# Patient Record
Sex: Female | Born: 1950 | Race: Black or African American | Hispanic: No | Marital: Single | Smoking: Former smoker
Health system: Southern US, Community
[De-identification: ages and names within clinical notes are randomized; demographics above are authoritative.]

## PROBLEM LIST (undated history)

## (undated) DIAGNOSIS — R112 Nausea with vomiting, unspecified: Secondary | ICD-10-CM

## (undated) DIAGNOSIS — F329 Major depressive disorder, single episode, unspecified: Secondary | ICD-10-CM

## (undated) DIAGNOSIS — F419 Anxiety disorder, unspecified: Secondary | ICD-10-CM

## (undated) DIAGNOSIS — M1990 Unspecified osteoarthritis, unspecified site: Secondary | ICD-10-CM

## (undated) DIAGNOSIS — F32A Depression, unspecified: Secondary | ICD-10-CM

## (undated) DIAGNOSIS — I10 Essential (primary) hypertension: Secondary | ICD-10-CM

## (undated) DIAGNOSIS — Z9889 Other specified postprocedural states: Secondary | ICD-10-CM

## (undated) DIAGNOSIS — C801 Malignant (primary) neoplasm, unspecified: Secondary | ICD-10-CM

## (undated) DIAGNOSIS — I509 Heart failure, unspecified: Secondary | ICD-10-CM

## (undated) HISTORY — PX: OTHER SURGICAL HISTORY: SHX169

## (undated) HISTORY — PX: HERNIA REPAIR: SHX51

## (undated) HISTORY — DX: Depression, unspecified: F32.A

## (undated) HISTORY — DX: Major depressive disorder, single episode, unspecified: F32.9

## (undated) HISTORY — DX: Heart failure, unspecified: I50.9

## (undated) HISTORY — DX: Anxiety disorder, unspecified: F41.9

## (undated) HISTORY — DX: Unspecified osteoarthritis, unspecified site: M19.90

## (undated) HISTORY — DX: Malignant (primary) neoplasm, unspecified: C80.1

## (undated) HISTORY — PX: BREAST BIOPSY: SHX20

## (undated) HISTORY — DX: Essential (primary) hypertension: I10

---

## 1975-10-15 DIAGNOSIS — J939 Pneumothorax, unspecified: Secondary | ICD-10-CM

## 1975-10-15 HISTORY — DX: Pneumothorax, unspecified: J93.9

## 1976-10-14 DIAGNOSIS — C801 Malignant (primary) neoplasm, unspecified: Secondary | ICD-10-CM

## 1976-10-14 HISTORY — DX: Malignant (primary) neoplasm, unspecified: C80.1

## 2005-10-14 HISTORY — PX: HIP SURGERY: SHX245

## 2005-10-14 HISTORY — PX: JOINT REPLACEMENT: SHX530

## 2015-05-28 DIAGNOSIS — S0181XA Laceration without foreign body of other part of head, initial encounter: Secondary | ICD-10-CM | POA: Diagnosis not present

## 2016-10-31 ENCOUNTER — Ambulatory Visit: Payer: Self-pay | Admitting: Family Medicine

## 2016-11-19 ENCOUNTER — Ambulatory Visit (INDEPENDENT_AMBULATORY_CARE_PROVIDER_SITE_OTHER): Payer: Medicare Other | Admitting: Family Medicine

## 2016-11-19 ENCOUNTER — Other Ambulatory Visit: Payer: Self-pay

## 2016-11-19 ENCOUNTER — Encounter: Payer: Self-pay | Admitting: Family Medicine

## 2016-11-19 VITALS — BP 146/92 | HR 120 | Temp 96.4°F | Resp 18 | Ht 62.5 in | Wt 209.0 lb

## 2016-11-19 DIAGNOSIS — M1990 Unspecified osteoarthritis, unspecified site: Secondary | ICD-10-CM | POA: Insufficient documentation

## 2016-11-19 DIAGNOSIS — Z7689 Persons encountering health services in other specified circumstances: Secondary | ICD-10-CM

## 2016-11-19 DIAGNOSIS — Z8541 Personal history of malignant neoplasm of cervix uteri: Secondary | ICD-10-CM

## 2016-11-19 DIAGNOSIS — M1991 Primary osteoarthritis, unspecified site: Secondary | ICD-10-CM

## 2016-11-19 DIAGNOSIS — Z87410 Personal history of cervical dysplasia: Secondary | ICD-10-CM | POA: Insufficient documentation

## 2016-11-19 DIAGNOSIS — I10 Essential (primary) hypertension: Secondary | ICD-10-CM

## 2016-11-19 DIAGNOSIS — G8929 Other chronic pain: Secondary | ICD-10-CM | POA: Insufficient documentation

## 2016-11-19 DIAGNOSIS — M549 Dorsalgia, unspecified: Secondary | ICD-10-CM

## 2016-11-19 DIAGNOSIS — F339 Major depressive disorder, recurrent, unspecified: Secondary | ICD-10-CM | POA: Diagnosis not present

## 2016-11-19 DIAGNOSIS — E559 Vitamin D deficiency, unspecified: Secondary | ICD-10-CM | POA: Diagnosis not present

## 2016-11-19 MED ORDER — GABAPENTIN 300 MG PO CAPS: 300.0000 mg | ORAL_CAPSULE | Freq: Three times a day (TID) | ORAL | 3 refills | Status: DC

## 2016-11-19 MED ORDER — TRAMADOL HCL 50 MG PO TABS: 50.0000 mg | ORAL_TABLET | Freq: Three times a day (TID) | ORAL | 0 refills | Status: DC | PRN

## 2016-11-19 NOTE — Patient Instructions (Addendum)
Need old records  Lab testing ordered  Try to stretch and exercise  See me in a week or two  Take the tramadol as needed severe pain  Take the gabapentin for nerve pain Take one at bedtime After a week take two a day Then may increase to three times a day Increase gradually to lessen sedation Any drowsiness wears off

## 2016-11-19 NOTE — Progress Notes (Signed)
Chief Complaint  Patient presents with  . Establish Care   New to establish No old records Chronic pain low back and hips Right hip replaced 2007, left hip painful Is on tramadol 3 x a day ambien at night cymbalta for ? Pain or depression, patient does not know Compliant with BP med.  Says it runs high.  Denies heart disease. Moved here from [place]  Had a complete PE with PAP, Mammo. Dexa and colonoscopy in 2017 before she moved  We had a discussion that I do not provide chronic narcotic pain management for non narcotic pain.  She is willing to try to wean off of the tramadol.  Will try gabapentin and ibuprofen.  She has stopped the [place].  Says she had a DUI because of it.  Patient Active Problem List   Diagnosis Date Noted  . Essential hypertension 11/19/2016  . Depression, recurrent ([place]) 11/19/2016  . Osteoarthritis 11/19/2016  . Chronic back pain greater than 3 months duration 11/19/2016  . History of cervical cancer 11/19/2016  . Morbid obesity ([place]) 11/19/2016    Outpatient Encounter Prescriptions as of 11/19/2016  Medication Sig  . DULoxetine (CYMBALTA) 60 MG capsule Take 60 mg by mouth daily.  Marland Kitchen lisinopril-hydrochlorothiazide (PRINZIDE,ZESTORETIC) 20-25 MG tablet Take 1 tablet by mouth daily.  . traMADol (ULTRAM) 50 MG tablet Take by mouth every 8 (eight) hours as needed.  . zolpidem (AMBIEN) 10 MG tablet Take 10 mg by mouth at bedtime as needed for sleep.  Marland Kitchen gabapentin (NEURONTIN) 300 MG capsule Take 1 capsule (300 mg total) by mouth 3 (three) times daily.  . traMADol (ULTRAM) 50 MG tablet Take 1 tablet (50 mg total) by mouth every 8 (eight) hours as needed.   No facility-administered encounter medications on file as of 11/19/2016.     Past Medical History:  Diagnosis Date  . Anxiety   . Arthritis   . Cancer ([place]) 1978   cervical  . Depression   . Hypertension     Past Surgical History:  Procedure Laterality Date  . HIP SURGERY  2007   right  . JOINT  REPLACEMENT  2007    Social History   Social History  . Marital status: Single    Spouse name: N/A  . Number of children: N/A  . Years of education: N/A   Occupational History  . Not on file.   Social History Main Topics  . Smoking status: Former Smoker    Packs/day: 1.00    Types: Cigarettes    Start date: 1966    Quit date: 10/14/2010  . Smokeless tobacco: Never Used  . Alcohol use 1.8 oz/week    3 Glasses of wine per week  . Drug use: No  . Sexual activity: No   Other Topics Concern  . Not on file   Social History Narrative  . No narrative on file    Family History  Problem Relation Age of Onset  . Heart disease Mother 28  . Arthritis Mother   . Diabetes Mother   . Alcohol abuse Father   . Cancer Father 64    lung  . Drug abuse Daughter   . Asthma Son   . Hypertension Son   . Mental illness Maternal Aunt   . Diabetes Maternal Grandmother   . Diabetes Paternal Grandmother   . Mental illness Daughter     schizophrenia  . Depression Daughter   . Mental illness Daughter     anxiety  . Obesity  Daughter     bariatric surgery  . Heart disease Maternal Aunt     Review of Systems  Constitutional: Negative for chills, fever and weight loss.  HENT: Negative for congestion and hearing loss.   Eyes: Negative for blurred vision and pain.  Respiratory: Negative for cough and shortness of breath.   Cardiovascular: Negative for chest pain and leg swelling.  Gastrointestinal: Negative for abdominal pain, constipation, diarrhea and heartburn.  Genitourinary: Negative for dysuria and frequency.  Musculoskeletal: Positive for back pain, joint pain and myalgias. Negative for falls.  Neurological: Negative for dizziness, seizures and headaches.  Psychiatric/Behavioral: Negative for depression. The patient has insomnia. The patient is not nervous/anxious.     BP (!) 146/92 (BP Location: Right Arm, Patient Position: Sitting, Cuff Size: Normal)   Pulse (!) 120   Temp  (!) 96.4 F (35.8 C) (Temporal)   Resp 18   Ht 5' 2.5" (1.588 m)   Wt 209 lb 0.6 oz (94.8 kg)   LMP 10/15/2007 (Approximate)   SpO2 97%   BMI 37.62 kg/m   Physical Exam  Constitutional: She is oriented to person, place, and time. She appears well-developed and well-nourished.  Obese.  HENT:  Head: Normocephalic and atraumatic.  Right Ear: External ear normal.  Left Ear: External ear normal.  Mouth/Throat: Oropharynx is clear and moist.  Eyes: Conjunctivae are normal. Pupils are equal, round, and reactive to light.  Neck: Normal range of motion. Neck supple. No thyromegaly present.  Cardiovascular: Normal rate, regular rhythm and normal heart sounds.   Pulse 100, reg  Pulmonary/Chest: Effort normal and breath sounds normal. No respiratory distress.  Abdominal: Soft. Bowel sounds are normal.  Musculoskeletal: Normal range of motion. She exhibits no edema.  Lymphadenopathy:    She has no cervical adenopathy.  Neurological: She is alert and oriented to person, place, and time.  Gait antalgic  Skin: Skin is warm and dry.  Psychiatric: She has a normal mood and affect. Her behavior is normal. Thought content normal.  Nursing note and vitals reviewed.  ASSESSMENT/PLAN:   1. Essential hypertension  -Comprehensive metabolic panel - CBC - Hemoglobin A1c - Lipid panel - Urinalysis, Routine w reflex microscopic - VITAMIN D 25 Hydroxy (Vit-D Deficiency, Fractures)  2. Depression, recurrent ([place])   3. Primary osteoarthritis, unspecified site   4. Chronic back pain greater than 3 months duration   5. Encounter to establish care with new doctor   6. History of cervical cancer    Patient Instructions  Need old records  Lab testing ordered  Try to stretch and exercise  See me in a week or two  Take the tramadol as needed severe pain  Take the gabapentin for nerve pain Take one at bedtime After a week take two a day Then may increase to three times a  day Increase gradually to lessen sedation Any drowsiness wears off   Raylene Everts, MD

## 2016-11-21 ENCOUNTER — Encounter: Payer: Self-pay | Admitting: Family Medicine

## 2016-11-21 DIAGNOSIS — E559 Vitamin D deficiency, unspecified: Secondary | ICD-10-CM | POA: Insufficient documentation

## 2016-11-21 DIAGNOSIS — R7303 Prediabetes: Secondary | ICD-10-CM | POA: Insufficient documentation

## 2016-11-21 LAB — LIPID PANEL
CHOL/HDL RATIO: 2.3 ratio (ref <5.0)
Cholesterol: 184 mg/dL (ref <200)
HDL: 81 mg/dL (ref >50)
LDL Cholesterol: 89 mg/dL (ref <100)
Triglycerides: 71 mg/dL (ref <150)
VLDL: 14 mg/dL (ref <30)

## 2016-11-21 LAB — URINALYSIS, ROUTINE W REFLEX MICROSCOPIC
Bilirubin Urine: NEGATIVE
Glucose, UA: NEGATIVE
Hgb urine dipstick: NEGATIVE
KETONES UR: NEGATIVE
Leukocytes, UA: NEGATIVE
Nitrite: NEGATIVE
Protein, ur: NEGATIVE
SPECIFIC GRAVITY, URINE: 1.02 (ref 1.001–1.035)
pH: 5.5 (ref 5.0–8.0)

## 2016-11-21 LAB — HEMOGLOBIN A1C
Hgb A1c MFr Bld: 6 % — ABNORMAL HIGH (ref <5.7)
MEAN PLASMA GLUCOSE: 126 mg/dL

## 2016-11-21 LAB — CBC
HEMATOCRIT: 34.8 % — AB (ref 35.0–45.0)
Hemoglobin: 10.8 g/dL — ABNORMAL LOW (ref 11.7–15.5)
MCH: 25.2 pg — ABNORMAL LOW (ref 27.0–33.0)
MCHC: 31 g/dL — AB (ref 32.0–36.0)
MCV: 81.1 fL (ref 80.0–100.0)
MPV: 10.8 fL (ref 7.5–12.5)
PLATELETS: 281 10*3/uL (ref 140–400)
RBC: 4.29 MIL/uL (ref 3.80–5.10)
RDW: 16.2 % — AB (ref 11.0–15.0)
WBC: 6.5 10*3/uL (ref 3.8–10.8)

## 2016-11-21 LAB — COMPREHENSIVE METABOLIC PANEL
ALK PHOS: 58 U/L (ref 33–130)
ALT: 17 U/L (ref 6–29)
AST: 19 U/L (ref 10–35)
Albumin: 4 g/dL (ref 3.6–5.1)
BILIRUBIN TOTAL: 0.2 mg/dL (ref 0.2–1.2)
BUN: 19 mg/dL (ref 7–25)
CO2: 28 mmol/L (ref 20–31)
CREATININE: 0.82 mg/dL (ref 0.50–0.99)
Calcium: 9.1 mg/dL (ref 8.6–10.4)
Chloride: 104 mmol/L (ref 98–110)
Glucose, Bld: 111 mg/dL — ABNORMAL HIGH (ref 65–99)
Potassium: 3.9 mmol/L (ref 3.5–5.3)
SODIUM: 140 mmol/L (ref 135–146)
TOTAL PROTEIN: 7 g/dL (ref 6.1–8.1)

## 2016-11-21 LAB — VITAMIN D 25 HYDROXY (VIT D DEFICIENCY, FRACTURES): VIT D 25 HYDROXY: 23 ng/mL — AB (ref 30–100)

## 2016-12-03 ENCOUNTER — Ambulatory Visit (INDEPENDENT_AMBULATORY_CARE_PROVIDER_SITE_OTHER): Payer: Medicare Other | Admitting: Family Medicine

## 2016-12-03 ENCOUNTER — Encounter: Payer: Self-pay | Admitting: Family Medicine

## 2016-12-03 VITALS — BP 138/86 | HR 100 | Temp 97.3°F | Resp 18 | Ht 62.5 in | Wt 213.1 lb

## 2016-12-03 DIAGNOSIS — F339 Major depressive disorder, recurrent, unspecified: Secondary | ICD-10-CM | POA: Diagnosis not present

## 2016-12-03 DIAGNOSIS — E559 Vitamin D deficiency, unspecified: Secondary | ICD-10-CM | POA: Diagnosis not present

## 2016-12-03 DIAGNOSIS — I10 Essential (primary) hypertension: Secondary | ICD-10-CM | POA: Diagnosis not present

## 2016-12-03 DIAGNOSIS — M549 Dorsalgia, unspecified: Secondary | ICD-10-CM | POA: Diagnosis not present

## 2016-12-03 DIAGNOSIS — G8929 Other chronic pain: Secondary | ICD-10-CM

## 2016-12-03 MED ORDER — TRAMADOL HCL 50 MG PO TABS: 50.0000 mg | ORAL_TABLET | Freq: Two times a day (BID) | ORAL | 0 refills | Status: DC

## 2016-12-03 MED ORDER — MELOXICAM 15 MG PO TABS: 15.0000 mg | ORAL_TABLET | Freq: Every day | ORAL | 2 refills | Status: DC

## 2016-12-03 NOTE — Progress Notes (Signed)
Chief Complaint  Patient presents with  . Follow-up  routine follow up Is trying to reduce her tramadol Is taking 2  Day Is taking gabapentin 3 a day Is on vitamin D supplement Feels better than when last seen Stopped her cymbalta 2 weeks ago Didn't know why she was on and it ran out Feels no different off of the med, did not have withdrawal symptoms Denies depression or mood change Has gained a few pounds and this is discussed.  She plans to go back on weight watchers BP control is good Pre diabetes diet discussed Recent labs discussed with patient.     Patient Active Problem List   Diagnosis Date Noted  . Vitamin D deficiency 11/21/2016  . Pre-diabetes 11/21/2016  . Essential hypertension 11/19/2016  . Depression, recurrent ([place]) 11/19/2016  . Osteoarthritis 11/19/2016  . Chronic back pain greater than 3 months duration 11/19/2016  . History of cervical cancer 11/19/2016  . Morbid obesity ([place]) 11/19/2016    Outpatient Encounter Prescriptions as of 12/03/2016  Medication Sig  . gabapentin (NEURONTIN) 300 MG capsule Take 1 capsule (300 mg total) by mouth 3 (three) times daily.  Marland Kitchen lisinopril-hydrochlorothiazide (PRINZIDE,ZESTORETIC) 20-25 MG tablet Take 1 tablet by mouth daily.  . meloxicam (MOBIC) 15 MG tablet Take 1 tablet (15 mg total) by mouth daily. Take with food  . traMADol (ULTRAM) 50 MG tablet Take 1 tablet (50 mg total) by mouth 2 (two) times daily.   No facility-administered encounter medications on file as of 12/03/2016.     No Known Allergies  Review of Systems  Constitutional: Positive for fatigue and unexpected weight change.  HENT: Positive for dental problem.        Needs extractions  Eyes: Negative for photophobia and visual disturbance.  Respiratory: Negative for cough and shortness of breath.   Cardiovascular: Negative for chest pain and palpitations.  Gastrointestinal: Negative for blood in stool, constipation and diarrhea.  Genitourinary:  Negative for difficulty urinating and frequency.  Musculoskeletal: Positive for back pain and gait problem.  Neurological: Negative for dizziness and headaches.  Psychiatric/Behavioral: Negative for dysphoric mood. The patient is not nervous/anxious.     BP 138/86 (BP Location: Right Arm, Patient Position: Sitting, Cuff Size: Normal)   Pulse 100   Temp 97.3 F (36.3 C) (Temporal)   Resp 18   Ht 5' 2.5" (1.588 m)   Wt 213 lb 1.9 oz (96.7 kg)   LMP 10/15/2007 (Approximate)   SpO2 96%   BMI 38.36 kg/m   Physical Exam  Constitutional: She is oriented to person, place, and time. She appears well-developed and well-nourished. No distress.  HENT:  Head: Normocephalic and atraumatic.  Mouth/Throat: Oropharynx is clear and moist.  Poor dentiton  Eyes: Conjunctivae are normal. Pupils are equal, round, and reactive to light.  Neck: Normal range of motion.  Cardiovascular: Normal rate, regular rhythm and normal heart sounds.   occas ectopy  Pulmonary/Chest: Effort normal and breath sounds normal.  Musculoskeletal: Normal range of motion. She exhibits no edema.  Lymphadenopathy:    She has no cervical adenopathy.  Neurological: She is alert and oriented to person, place, and time.  Skin: Skin is warm and dry.  Psychiatric: She has a normal mood and affect. Her behavior is normal.    ASSESSMENT/PLAN:  1. Essential hypertension controlled  2. Chronic back pain greater than 3 months duration Stable.  Trying to reduce tramadol ,  we discussed the anti-inflammatory medicines, steroid and nonsteroid. I'm going to  place her on meloxicam 15 mg a day with food. She is advised not to take other NSAID medicine while on this. She may take Tylenol or tramadol. She will report any GI distress, heartburn, or abdominal pain.  3. Depression, recurrent ([place]) Patient stopped Cymbalta, asymptomatic  4. Morbid obesity ([place]) Worsening. Discussed with patient the importance of diet and exercise. She is  going back on Weight Watchers. She needs to commit to regular exercise.  5. Vitamin D deficiency Replaced   Patient Instructions  Take the meloxicam once a day This is a non steroid anti inflammatory medicine It is for arthritis pain Take with food Let me know if you have heartburn or abdominal pain Do not take ibuprofen or aleve while of meloxicam You may take tylenol or tramadol  cotinue the gabapentin  Hold the cymbalta for now Let me know if you feel increasing depression or irritability and need to go back on this  See me in three months Need a physical next time         Raylene Everts, MD

## 2016-12-03 NOTE — Patient Instructions (Addendum)
Take the meloxicam once a day This is a non steroid anti inflammatory medicine It is for arthritis pain Take with food Let me know if you have heartburn or abdominal pain Do not take ibuprofen or aleve while of meloxicam You may take tylenol or tramadol  cotinue the gabapentin  Hold the cymbalta for now Let me know if you feel increasing depression or irritability and need to go back on this  See me in three months Need a physical next time

## 2016-12-11 ENCOUNTER — Encounter: Payer: Self-pay | Admitting: Family Medicine

## 2016-12-11 DIAGNOSIS — D126 Benign neoplasm of colon, unspecified: Secondary | ICD-10-CM | POA: Insufficient documentation

## 2016-12-11 DIAGNOSIS — R0600 Dyspnea, unspecified: Secondary | ICD-10-CM | POA: Insufficient documentation

## 2016-12-29 ENCOUNTER — Other Ambulatory Visit: Payer: Self-pay | Admitting: Family Medicine

## 2016-12-30 NOTE — Telephone Encounter (Signed)
Seen 2 20 18

## 2017-01-14 ENCOUNTER — Other Ambulatory Visit: Payer: Self-pay | Admitting: Family Medicine

## 2017-01-14 ENCOUNTER — Ambulatory Visit (INDEPENDENT_AMBULATORY_CARE_PROVIDER_SITE_OTHER): Payer: Medicare Other | Admitting: Orthopaedic Surgery

## 2017-01-14 ENCOUNTER — Ambulatory Visit (INDEPENDENT_AMBULATORY_CARE_PROVIDER_SITE_OTHER): Payer: Medicare Other

## 2017-01-14 ENCOUNTER — Encounter: Payer: Self-pay | Admitting: Orthopaedic Surgery

## 2017-01-14 VITALS — BP 151/80 | HR 116 | Ht 63.0 in | Wt 211.2 lb

## 2017-01-14 DIAGNOSIS — Z96641 Presence of right artificial hip joint: Secondary | ICD-10-CM

## 2017-01-14 DIAGNOSIS — M25552 Pain in left hip: Secondary | ICD-10-CM | POA: Diagnosis not present

## 2017-01-14 DIAGNOSIS — I10 Essential (primary) hypertension: Secondary | ICD-10-CM | POA: Diagnosis not present

## 2017-01-14 DIAGNOSIS — G8929 Other chronic pain: Secondary | ICD-10-CM

## 2017-01-14 NOTE — Progress Notes (Signed)
Subjective:    Patient ID: Sarah Cooper, female    DOB: 1951-04-24, 66 y.o.   MRN: [id number]  HPI She has left hip pain that is getting worse.  She is post right hip total hip replacement in 2007 in [place]. She did well from that. She was told she would need a left hip replacement in the future.  She moved to this area last year.  She has no trauma.  She has pain with walking and now when moving the hip when laying down.  She takes Tramadol which helps. She has not tried Advil, heat or ice.  She takes Mobic as well and sees little help now.  She has pain of the left hip that radiates to the left knee at times but her left knee has no swelling or giving way.   Review of Systems  HENT: Negative for congestion.   Respiratory: Negative for cough and shortness of breath.   Cardiovascular: Negative for chest pain and leg swelling.  Endocrine: Positive for cold intolerance.  Musculoskeletal: Positive for arthralgias and gait problem.  Psychiatric/Behavioral: The patient is nervous/anxious.    Past Medical History:  Diagnosis Date  . Anxiety   . Arthritis   . Cancer ([place]) 1978   cervical  . Depression   . Hypertension     Past Surgical History:  Procedure Laterality Date  . HIP SURGERY  2007   right  . JOINT REPLACEMENT  2007    Current Outpatient Prescriptions on File Prior to Visit  Medication Sig Dispense Refill  . gabapentin (NEURONTIN) 300 MG capsule Take 1 capsule (300 mg total) by mouth 3 (three) times daily. 90 capsule 3  . lisinopril-hydrochlorothiazide (PRINZIDE,ZESTORETIC) 20-25 MG tablet TAKE 1 TABLET EVERY DAY 90 tablet 1  . meloxicam (MOBIC) 15 MG tablet Take 1 tablet (15 mg total) by mouth daily. Take with food 30 tablet 2  . traMADol (ULTRAM) 50 MG tablet Take 1 tablet (50 mg total) by mouth 2 (two) times daily. 180 tablet 0   No current facility-administered medications on file prior to visit.     Social History   Social History  . Marital  status: Single    Spouse name: N/A  . Number of children: N/A  . Years of education: N/A   Occupational History  . Not on file.   Social History Main Topics  . Smoking status: Former Smoker    Packs/day: 1.00    Types: Cigarettes    Start date: 1966    Quit date: 10/14/2010  . Smokeless tobacco: Never Used  . Alcohol use 1.8 oz/week    3 Glasses of wine per week  . Drug use: No  . Sexual activity: No   Other Topics Concern  . Not on file   Social History Narrative  . No narrative on file    Family History  Problem Relation Age of Onset  . Heart disease Mother 60  . Arthritis Mother   . Diabetes Mother   . Alcohol abuse Father   . Cancer Father 74    lung  . Drug abuse Daughter   . Asthma Son   . Hypertension Son   . Mental illness Maternal Aunt   . Diabetes Maternal Grandmother   . Diabetes Paternal Grandmother   . Mental illness Daughter     schizophrenia  . Depression Daughter   . Mental illness Daughter     anxiety  . Obesity Daughter     bariatric surgery  .  Heart disease Maternal Aunt     BP (!) 151/80   Pulse (!) 116   Ht 5\' 3"  (1.6 m)   Wt 211 lb 3.2 oz (95.8 kg)   LMP 10/15/2007 (Approximate)   BMI 37.41 kg/m      Objective:   Physical Exam  Constitutional: She is oriented to person, place, and time. She appears well-developed and well-nourished.  HENT:  Head: Normocephalic and atraumatic.  Eyes: Conjunctivae and EOM are normal. Pupils are equal, round, and reactive to light.  Neck: Normal range of motion. Neck supple.  Cardiovascular: Normal rate, regular rhythm and intact distal pulses.   Pulmonary/Chest: Effort normal.  Abdominal: Soft.  Musculoskeletal: She exhibits tenderness (Limp to the left, left hip painful with motion, internal and external with 10 degrees motion, flexes to 90, abductiona nd adduction painful.  NV intact.  Right hip with good motion.  both trochanteric areas are tender.).  Neurological: She is alert and oriented  to person, place, and time. She displays normal reflexes. No cranial nerve deficit. She exhibits normal muscle tone. Coordination normal.  Skin: Skin is warm and dry.  Psychiatric: She has a normal mood and affect. Her behavior is normal. Judgment and thought content normal.  Vitals reviewed.  X-rays were done of the pelvis and left hip lateral, reported separately.       Assessment & Plan:   Encounter Diagnoses  Name Primary?  . Chronic left hip pain Yes  . Essential hypertension   . Status post total hip replacement, right    I spent some time with her explaining that her left hip is "worn out" and that she should consider a total hip replacement now on the left.  She got a good 11 years of wear from the left hip since her right hip surgery was done.  We talked about post op care at a nursing home.  She will discuss all of this with Dr. Meda Coffee.  She will seriously consider surgery on the left hip.  I will see her as needed.  Continue present medicine.  Call if any problem.  Electronically Signed Sanjuana Kava, MD 4/3/20189:58 AM

## 2017-01-15 ENCOUNTER — Other Ambulatory Visit: Payer: Self-pay

## 2017-01-15 MED ORDER — GABAPENTIN 300 MG PO CAPS: 300.0000 mg | ORAL_CAPSULE | Freq: Three times a day (TID) | ORAL | 3 refills | Status: DC

## 2017-01-17 ENCOUNTER — Other Ambulatory Visit: Payer: Self-pay | Admitting: Family Medicine

## 2017-01-27 ENCOUNTER — Other Ambulatory Visit: Payer: Self-pay

## 2017-01-28 ENCOUNTER — Other Ambulatory Visit: Payer: Self-pay | Admitting: Family Medicine

## 2017-02-05 DIAGNOSIS — I10 Essential (primary) hypertension: Secondary | ICD-10-CM | POA: Diagnosis not present

## 2017-02-05 DIAGNOSIS — E119 Type 2 diabetes mellitus without complications: Secondary | ICD-10-CM | POA: Diagnosis not present

## 2017-02-05 DIAGNOSIS — R7301 Impaired fasting glucose: Secondary | ICD-10-CM | POA: Diagnosis not present

## 2017-02-18 ENCOUNTER — Other Ambulatory Visit: Payer: Self-pay | Admitting: Family Medicine

## 2017-02-18 NOTE — Telephone Encounter (Signed)
Seen 2 20 18

## 2017-03-03 ENCOUNTER — Encounter: Payer: Self-pay | Admitting: Family Medicine

## 2017-03-03 ENCOUNTER — Ambulatory Visit (INDEPENDENT_AMBULATORY_CARE_PROVIDER_SITE_OTHER): Payer: Medicare Other | Admitting: Family Medicine

## 2017-03-03 VITALS — BP 180/94 | HR 100 | Temp 96.6°F | Resp 18 | Ht 63.0 in | Wt 221.1 lb

## 2017-03-03 DIAGNOSIS — G8929 Other chronic pain: Secondary | ICD-10-CM | POA: Diagnosis not present

## 2017-03-03 DIAGNOSIS — Z1159 Encounter for screening for other viral diseases: Secondary | ICD-10-CM

## 2017-03-03 DIAGNOSIS — E559 Vitamin D deficiency, unspecified: Secondary | ICD-10-CM | POA: Diagnosis not present

## 2017-03-03 DIAGNOSIS — I10 Essential (primary) hypertension: Secondary | ICD-10-CM | POA: Diagnosis not present

## 2017-03-03 DIAGNOSIS — M549 Dorsalgia, unspecified: Secondary | ICD-10-CM | POA: Diagnosis not present

## 2017-03-03 DIAGNOSIS — Z87410 Personal history of cervical dysplasia: Secondary | ICD-10-CM

## 2017-03-03 DIAGNOSIS — R7303 Prediabetes: Secondary | ICD-10-CM

## 2017-03-03 DIAGNOSIS — M1991 Primary osteoarthritis, unspecified site: Secondary | ICD-10-CM | POA: Diagnosis not present

## 2017-03-03 MED ORDER — FLUOCINONIDE-E 0.05 % EX CREA
1.0000 | TOPICAL_CREAM | Freq: Two times a day (BID) | CUTANEOUS | 0 refills | Status: DC
Start: 2017-03-03 — End: 2019-08-06

## 2017-03-03 MED ORDER — ZOSTER VACCINE LIVE 19400 UNT/0.65ML ~~LOC~~ SUSR: 0.6500 mL | Freq: Once | SUBCUTANEOUS | 0 refills | Status: AC

## 2017-03-03 MED ORDER — AMLODIPINE BESYLATE 5 MG PO TABS: 5.0000 mg | ORAL_TABLET | Freq: Every day | ORAL | 3 refills | Status: DC

## 2017-03-03 NOTE — Patient Instructions (Signed)
See me in six months Go back on the weight watchers Try ti increase the walking Take the amlodipine daily This is for blood pressure You  May stop the mobic You may try reducing the gabapentin Call for refill tramadol Need labs then a visit in six months  We will send an order for the shingles shots

## 2017-03-03 NOTE — Progress Notes (Signed)
Chief Complaint  Patient presents with  . Annual Exam  I have received old records from prior physicians.  She is due for shingles shots and these are ordered.  She is due for Hep C screening and this is ordered. Recent labs reviewed with her and are normal or as expected. (feb)  I discussed again the importance of diet and exercise to manage her weight, her prediabetes and her arthritic conditions.  Her weight is up.  Her BP is up.  I am adding amlodipine to her regimen. Her last PAP is 2014, she had a cervical conization for dysplasia in the 70s.  She had no PAPs for 10 years, then started again in 2014.  She had a neg pap and HPV at that time, needs to be repeated in 5 y if she desires.  She has a new sexual partner, and needs continued screening. Mammo up to date [place] up to date Has seen ortho and is planning a hip replacement in September.   Patient Active Problem List   Diagnosis Date Noted  . Tubular adenoma of colon 12/11/2016  . Dyspnea 12/11/2016  . Vitamin D deficiency 11/21/2016  . Pre-diabetes 11/21/2016  . Essential hypertension 11/19/2016  . Depression, recurrent ([place]) 11/19/2016  . Osteoarthritis 11/19/2016  . Chronic back pain greater than 3 months duration 11/19/2016  . History of cervical dysplasia 11/19/2016  . Morbid obesity ([place]) 11/19/2016    Outpatient Encounter Prescriptions as of 03/03/2017  Medication Sig  . gabapentin (NEURONTIN) 300 MG capsule Take 1 capsule (300 mg total) by mouth 3 (three) times daily.  Marland Kitchen lisinopril-hydrochlorothiazide (PRINZIDE,ZESTORETIC) 20-25 MG tablet TAKE 1 TABLET EVERY DAY  . traMADol (ULTRAM) 50 MG tablet TAKE 1 TABLET BY MOUTH TWICE A DAY  . amLODipine (NORVASC) 5 MG tablet Take 1 tablet (5 mg total) by mouth daily.  . fluocinonide-emollient (LIDEX-E) 0.05 % cream Apply 1 application topically 2 (two) times daily. To right leg  . Zoster Vaccine Live, PF, (ZOSTAVAX) 08657 UNT/0.65ML injection Inject 19,400 Units into the  skin once.   No facility-administered encounter medications on file as of 03/03/2017.     Allergies  Allergen Reactions  . Aspirin     Review of Systems  Constitutional: Positive for unexpected weight change. Negative for activity change and appetite change.  HENT: Negative for congestion, dental problem, postnasal drip and rhinorrhea.   Eyes: Negative for redness and visual disturbance.  Respiratory: Negative for cough and shortness of breath.   Cardiovascular: Negative for chest pain, palpitations and leg swelling.  Gastrointestinal: Negative for abdominal pain, constipation and diarrhea.  Genitourinary: Negative for difficulty urinating, frequency, menstrual problem and vaginal bleeding.  Musculoskeletal: Positive for arthralgias, back pain and gait problem.  Neurological: Negative for dizziness and headaches.  Psychiatric/Behavioral: Negative for dysphoric mood and sleep disturbance. The patient is not nervous/anxious.     BP (!) 180/94 (BP Location: Right Arm, Patient Position: Sitting, Cuff Size: Normal)   Pulse 100   Temp (!) 96.6 F (35.9 C) (Temporal)   Resp 18   Ht 5\' 3"  (1.6 m)   Wt 221 lb 1.9 oz (100.3 kg)   LMP 10/15/2007 (Approximate)   SpO2 96%   BMI 39.17 kg/m   Physical Exam  BP (!) 180/94 (BP Location: Right Arm, Patient Position: Sitting, Cuff Size: Normal)   Pulse 100   Temp (!) 96.6 F (35.9 C) (Temporal)   Resp 18   Ht 5\' 3"  (1.6 m)   Wt 221 lb  1.9 oz (100.3 kg)   LMP 10/15/2007 (Approximate)   SpO2 96%   BMI 39.17 kg/m   General Appearance:    Alert, cooperative, no distress, appears stated age  Head:    Normocephalic, without obvious abnormality, atraumatic  Eyes:    PERRL, conjunctiva/corneas clear, EOM's intact, fundi    benign, both eyes  Ears:    Normal TM's and external ear canals, both ears  Nose:   Nares normal, septum midline, mucosa normal, no drainage    or sinus tenderness  Throat:   Lips, mucosa, and tongue normal; teeth and  gums normal  Neck:   Supple, symmetrical, trachea midline, no adenopathy;    thyroid:  no enlargement/tenderness/nodules; no carotid   bruit   Back:     Symmetric, no curvature, ROM normal, no CVA tenderness  Lungs:     Clear to auscultation bilaterally, respirations unlabored  Chest Wall:    No tenderness or deformity   Heart:    Regular rate and rhythm, S1 and S2 normal, no murmur, rub   or gallop  Breast Exam:    No tenderness, masses, or nipple abnormality. Nodules palpated  Abdomen:     Soft, non-tender, bowel sounds active all four quadrants,    no masses, no organomegaly  Extremities:   Extremities normal, atraumatic, no cyanosis or edema  Pulses:   1 and symmetric, feet  Skin:   Skin color, texture, turgor normal, no rashes , sopt nummular eczema right calf  Lymph nodes:   Cervical, supraclavicular, and axillary nodes normal  Neurologic:   Normal strength, sensation and reflexes    throughout     ASSESSMENT/PLAN:  1. Essential hypertension Add amlodipine.  Not controlled - CBC - COMPLETE METABOLIC PANEL WITH GFR - Lipid panel - Urinalysis, Routine w reflex microscopic  2. Chronic back pain greater than 3 months duration tramadol  3. Morbid obesity ([place]) worse  4. Vitamin D deficiency discussed - VITAMIN D 25 Hydroxy (Vit-D Deficiency, Fractures)  5. Primary osteoarthritis, unspecified site Hip - sees orhto  6. History of cervical dysplasia PAP 2019  7. Encounter for hepatitis C screening test for low risk patient  - Hepatitis C antibody  8. Prediabetes Diet.  Exercise.  Check in 6 motnhs - Hemoglobin A1c   Patient Instructions  See me in six months Go back on the weight watchers Try ti increase the walking Take the amlodipine daily This is for blood pressure You  May stop the mobic You may try reducing the gabapentin Call for refill tramadol Need labs then a visit in six months  We will send an order for the shingles shots    Raylene Everts, MD

## 2017-03-12 ENCOUNTER — Telehealth: Payer: Self-pay | Admitting: Family Medicine

## 2017-03-12 NOTE — Telephone Encounter (Signed)
Patient brought in temp handicap parking placard to be filled out and was wanting to know the status of referring her to an orthopedist for L hip.  Also is requesting a script for a BP monitor. Patient uses [facility]

## 2017-03-13 ENCOUNTER — Other Ambulatory Visit: Payer: Self-pay | Admitting: Family Medicine

## 2017-03-13 DIAGNOSIS — M1610 Unilateral primary osteoarthritis, unspecified hip: Secondary | ICD-10-CM

## 2017-03-13 MED ORDER — BLOOD PRESSURE MONITOR MISC: 0 refills | Status: DC

## 2017-03-19 ENCOUNTER — Encounter: Payer: Self-pay | Admitting: Family Medicine

## 2017-03-26 ENCOUNTER — Other Ambulatory Visit: Payer: Self-pay | Admitting: Family Medicine

## 2017-03-27 NOTE — Telephone Encounter (Signed)
Patient calling back about scripts.  She would like a call back when this has been taken care of.  She states that Dr Meda Coffee would give her these when needed, and she "desperately needs them."  Patient is aware that Dr Meda Coffee is not her until next week.

## 2017-03-28 ENCOUNTER — Other Ambulatory Visit: Payer: Self-pay

## 2017-03-28 MED ORDER — BLOOD PRESSURE MONITOR MISC: 0 refills | Status: DC

## 2017-04-01 MED ORDER — TRAMADOL HCL 50 MG PO TABS: 50.0000 mg | ORAL_TABLET | Freq: Two times a day (BID) | ORAL | 0 refills | Status: DC

## 2017-04-02 DIAGNOSIS — M25552 Pain in left hip: Secondary | ICD-10-CM | POA: Diagnosis not present

## 2017-05-19 ENCOUNTER — Other Ambulatory Visit: Payer: Self-pay | Admitting: Family Medicine

## 2017-05-19 MED ORDER — TRAMADOL HCL 50 MG PO TABS: 50.0000 mg | ORAL_TABLET | Freq: Four times a day (QID) | ORAL | 0 refills | Status: DC | PRN

## 2017-05-20 ENCOUNTER — Ambulatory Visit (INDEPENDENT_AMBULATORY_CARE_PROVIDER_SITE_OTHER): Payer: Medicare Other | Admitting: Family Medicine

## 2017-05-20 ENCOUNTER — Encounter: Payer: Self-pay | Admitting: Family Medicine

## 2017-05-20 VITALS — BP 132/74 | HR 92 | Temp 97.7°F | Resp 16 | Ht 63.0 in | Wt 207.1 lb

## 2017-05-20 DIAGNOSIS — Z1159 Encounter for screening for other viral diseases: Secondary | ICD-10-CM | POA: Diagnosis not present

## 2017-05-20 DIAGNOSIS — M1991 Primary osteoarthritis, unspecified site: Secondary | ICD-10-CM | POA: Diagnosis not present

## 2017-05-20 DIAGNOSIS — M549 Dorsalgia, unspecified: Secondary | ICD-10-CM | POA: Diagnosis not present

## 2017-05-20 DIAGNOSIS — K056 Periodontal disease, unspecified: Secondary | ICD-10-CM | POA: Diagnosis not present

## 2017-05-20 DIAGNOSIS — R7303 Prediabetes: Secondary | ICD-10-CM

## 2017-05-20 DIAGNOSIS — G8929 Other chronic pain: Secondary | ICD-10-CM

## 2017-05-20 NOTE — Patient Instructions (Addendum)
Let me know if the cough does not improve  I will call an oral surgeon about approval  Take the tramadol 4 times a day Go back on neurontin for the nerve pain  Need updated labs  See me in 3 months

## 2017-05-20 NOTE — Progress Notes (Signed)
Chief Complaint  Patient presents with  . Follow-up   Needs refill of tramadol Forgot to get her gapabentin refilled and her feet are tingling Needs to have all of her teeth pulled before she can have her hip replaced.  States she cannnot afford to.  Wants to know if health insurance pays for extractions when medically necessary. BP well controlled Weight stable Cough and cold for 2 weeks - improving. Discussed symptomatic Tx viruses  Patient Active Problem List   Diagnosis Date Noted  . Tubular adenoma of colon 12/11/2016  . Dyspnea 12/11/2016  . Vitamin D deficiency 11/21/2016  . Pre-diabetes 11/21/2016  . Essential hypertension 11/19/2016  . Depression, recurrent ([place]) 11/19/2016  . Osteoarthritis 11/19/2016  . Chronic back pain greater than 3 months duration 11/19/2016  . History of cervical dysplasia 11/19/2016  . Morbid obesity ([place]) 11/19/2016    Outpatient Encounter Prescriptions as of 05/20/2017  Medication Sig  . amLODipine (NORVASC) 5 MG tablet Take 1 tablet (5 mg total) by mouth daily.  . Blood Pressure Monitor MISC Disp. 1 b/p monitor.  . fluocinonide-emollient (LIDEX-E) 0.05 % cream Apply 1 application topically 2 (two) times daily. To right leg  . gabapentin (NEURONTIN) 300 MG capsule Take 1 capsule (300 mg total) by mouth 3 (three) times daily.  Marland Kitchen lisinopril-hydrochlorothiazide (PRINZIDE,ZESTORETIC) 20-25 MG tablet TAKE 1 TABLET EVERY DAY  . traMADol (ULTRAM) 50 MG tablet Take 1 tablet (50 mg total) by mouth every 6 (six) hours as needed. MDD=3   No facility-administered encounter medications on file as of 05/20/2017.     Allergies  Allergen Reactions  . Aspirin     Review of Systems  Constitutional: Negative for activity change, appetite change and unexpected weight change.  HENT: Negative for congestion, dental problem, postnasal drip and rhinorrhea.   Eyes: Negative for redness and visual disturbance.  Respiratory: Negative for cough and shortness of  breath.   Cardiovascular: Negative for chest pain, palpitations and leg swelling.  Gastrointestinal: Negative for abdominal pain, constipation and diarrhea.  Genitourinary: Negative for difficulty urinating, frequency, menstrual problem and vaginal bleeding.  Musculoskeletal: Positive for arthralgias, back pain and gait problem.  Neurological: Negative for dizziness and headaches.  Psychiatric/Behavioral: Negative for dysphoric mood and sleep disturbance. The patient is not nervous/anxious.     BP 132/74 (BP Location: Right Arm, Patient Position: Sitting, Cuff Size: Large)   Pulse 92   Temp 97.7 F (36.5 C) (Temporal)   Resp 16   Ht 5\' 3"  (1.6 m)   Wt 207 lb 1.3 oz (93.9 kg)   LMP 10/15/2007 (Approximate)   SpO2 96%   BMI 36.68 kg/m   Physical Exam  Constitutional: She is oriented to person, place, and time. She appears well-developed and well-nourished. No distress.  HENT:  Head: Normocephalic and atraumatic.  Mouth/Throat: Oropharynx is clear and moist.  Poor dentiton  Eyes: Pupils are equal, round, and reactive to light. Conjunctivae are normal.  Neck: Normal range of motion.  Cardiovascular: Normal rate, regular rhythm and normal heart sounds.   occas ectopy  Pulmonary/Chest: Effort normal and breath sounds normal.  Musculoskeletal: Normal range of motion. She exhibits no edema.  Lymphadenopathy:    She has no cervical adenopathy.  Neurological: She is alert and oriented to person, place, and time.  Slow movements. Antalgic gait  Skin: Skin is warm and dry.  Psychiatric: She has a normal mood and affect. Her behavior is normal.    ASSESSMENT/PLAN:  1. Chronic periodontal disease  2. Chronic back pain greater than 3 months duration  3. Primary osteoarthritis, unspecified site  4. Encounter for hepatitis C screening test for low risk patient - Hepatitis C Antibody  5. Pre-diabetes - BASIC METABOLIC PANEL WITH GFR - Hemoglobin A1c   Patient Instructions  Let  me know if the cough does not improve  I will call an oral surgeon about approval  Take the tramadol 4 times a day Go back on neurontin for the nerve pain  Need updated labs  See me in 3 months     Raylene Everts, MD

## 2017-06-10 ENCOUNTER — Telehealth: Payer: Self-pay

## 2017-06-10 NOTE — Telephone Encounter (Signed)
Email to [employer]: Not sure who I need to give this to.  Sondos Wolfman DOB [birth year] was seen by Dr. Meda Coffee on 03/03/17.  She said the insurance company denied her visit and she said they told her it was coded wrong.    Can you help with this?  The patient said it was her annual visit.  Her phone # is [phone]

## 2017-06-17 ENCOUNTER — Telehealth: Payer: Self-pay | Admitting: Family Medicine

## 2017-06-17 ENCOUNTER — Telehealth: Payer: Self-pay

## 2017-06-17 DIAGNOSIS — Z1159 Encounter for screening for other viral diseases: Secondary | ICD-10-CM | POA: Diagnosis not present

## 2017-06-17 DIAGNOSIS — R7303 Prediabetes: Secondary | ICD-10-CM | POA: Diagnosis not present

## 2017-06-17 LAB — BASIC METABOLIC PANEL WITH GFR
BUN: 11 mg/dL (ref 7–25)
CHLORIDE: 102 mmol/L (ref 98–110)
CO2: 29 mmol/L (ref 20–32)
CREATININE: 0.61 mg/dL (ref 0.50–0.99)
Calcium: 9.6 mg/dL (ref 8.6–10.4)
GFR, Est African American: >89 mL/min (ref >=60)
GFR, Est Non African American: >89 mL/min (ref >=60)
GLUCOSE: 121 mg/dL — AB (ref 65–99)
POTASSIUM: 4.3 mmol/L (ref 3.5–5.3)
Sodium: 139 mmol/L (ref 135–146)

## 2017-06-17 LAB — HEPATITIS C ANTIBODY: HCV Ab: NONREACTIVE

## 2017-06-17 NOTE — Telephone Encounter (Signed)
Patient is asking about the dental information that is suppose to be added to her chart.  Cb#: [phone]

## 2017-06-17 NOTE — Telephone Encounter (Signed)
I believe she feels that if the dental extractions are medically necessary then her medicare will pay for them.  This is a question for medicare.

## 2017-06-17 NOTE — Telephone Encounter (Signed)
I have attempted to call pt for clarification of what she is looking for, no answer, no answering machine.

## 2017-06-17 NOTE — Telephone Encounter (Signed)
Sarah Cooper with [facility] had a coder check this record and they are correcting the code to [id number].  I have called the patient and let her know they are re-filing the claim.

## 2017-06-18 LAB — HEMOGLOBIN A1C
HEMOGLOBIN A1C: 6 % — AB (ref <5.7)
Mean Plasma Glucose: 126 mg/dL

## 2017-06-28 ENCOUNTER — Other Ambulatory Visit: Payer: Self-pay | Admitting: Family Medicine

## 2017-06-30 NOTE — Telephone Encounter (Signed)
Seen [phone] 

## 2017-07-03 DIAGNOSIS — Z23 Encounter for immunization: Secondary | ICD-10-CM | POA: Diagnosis not present

## 2017-08-20 ENCOUNTER — Ambulatory Visit (INDEPENDENT_AMBULATORY_CARE_PROVIDER_SITE_OTHER): Payer: Medicare Other | Admitting: Family Medicine

## 2017-08-20 ENCOUNTER — Encounter: Payer: Self-pay | Admitting: Family Medicine

## 2017-08-20 VITALS — BP 172/76 | HR 100 | Temp 97.6°F | Resp 16 | Ht 63.0 in | Wt 212.0 lb

## 2017-08-20 DIAGNOSIS — K056 Periodontal disease, unspecified: Secondary | ICD-10-CM

## 2017-08-20 DIAGNOSIS — I10 Essential (primary) hypertension: Secondary | ICD-10-CM

## 2017-08-20 DIAGNOSIS — R7303 Prediabetes: Secondary | ICD-10-CM

## 2017-08-20 MED ORDER — TRAMADOL HCL 50 MG PO TABS: 50.0000 mg | ORAL_TABLET | Freq: Four times a day (QID) | ORAL | 0 refills | Status: DC | PRN

## 2017-08-20 NOTE — Progress Notes (Signed)
Chief Complaint  Patient presents with  . Follow-up    3 month  Patient is here for routine follow-up. She requests a refill of tramadol for her hip pain. She has been inactive because of her hip pain.  She has gained weight.  We discussed that this is undesirable for her health, prediabetes and hypertension, and for the stress it places on her joints.  She will try to be better with her diet. Her blood pressure is well controlled.  She takes lisinopril, hydrochlorothiazide.  She refuses to take Norvasc because she read on the Internet that it causes cancer.  I reassured her that this is not true. Her most recent laboratory results are reviewed with her. She is due for mammogram in March and is reminded. She had her flu shot and pneumonia shots. She has still not seen oral surgeon for her teeth.  She is given a business card for Dr. Buelah Manis in [place]  Patient Active Problem List   Diagnosis Date Noted  . Tubular adenoma of colon 12/11/2016  . Dyspnea 12/11/2016  . Vitamin D deficiency 11/21/2016  . Pre-diabetes 11/21/2016  . Essential hypertension 11/19/2016  . Depression, recurrent ([place]) 11/19/2016  . Osteoarthritis 11/19/2016  . Chronic back pain greater than 3 months duration 11/19/2016  . History of cervical dysplasia 11/19/2016  . Morbid obesity ([place]) 11/19/2016    Outpatient Encounter Medications as of 08/20/2017  Medication Sig  . amLODipine (NORVASC) 5 MG tablet Take 1 tablet (5 mg total) by mouth daily.  . Blood Pressure Monitor MISC Disp. 1 b/p monitor.  . fluocinonide-emollient (LIDEX-E) 0.05 % cream Apply 1 application topically 2 (two) times daily. To right leg  . gabapentin (NEURONTIN) 300 MG capsule Take 1 capsule (300 mg total) by mouth 3 (three) times daily.  Marland Kitchen lisinopril-hydrochlorothiazide (PRINZIDE,ZESTORETIC) 20-25 MG tablet TAKE 1 TABLET EVERY DAY  . traMADol (ULTRAM) 50 MG tablet Take 1 tablet (50 mg total) every 6 (six) hours as needed by mouth. MDD=3   . [DISCONTINUED] traMADol (ULTRAM) 50 MG tablet Take 1 tablet (50 mg total) by mouth every 6 (six) hours as needed. MDD=3   No facility-administered encounter medications on file as of 08/20/2017.     Allergies  Allergen Reactions  . Aspirin     Review of Systems  Constitutional: Negative for activity change, appetite change and unexpected weight change.  HENT: Negative for congestion, dental problem, postnasal drip and rhinorrhea.   Eyes: Negative for redness and visual disturbance.  Respiratory: Negative for cough and shortness of breath.   Cardiovascular: Negative for chest pain, palpitations and leg swelling.  Gastrointestinal: Negative for abdominal pain, constipation and diarrhea.  Genitourinary: Negative for difficulty urinating, frequency, menstrual problem and vaginal bleeding.  Musculoskeletal: Positive for arthralgias, back pain and gait problem.  Neurological: Negative for dizziness and headaches.  Psychiatric/Behavioral: Negative for dysphoric mood and sleep disturbance. The patient is nervous/anxious.        Daughter-in-law diagnosed with stage IV esophageal cancer    BP (!) 172/76 (BP Location: Right Arm, Patient Position: Sitting, Cuff Size: Normal)   Pulse 100   Temp 97.6 F (36.4 C) (Temporal)   Resp 16   Ht 5\' 3"  (1.6 m)   Wt 212 lb 0.6 oz (96.2 kg)   LMP 10/15/2007 (Approximate)   SpO2 96%   BMI 37.56 kg/m   Physical Exam  Constitutional: She is oriented to person, place, and time. She appears well-developed and well-nourished. No distress.  Obese  HENT:  Head: Normocephalic and atraumatic.  Mouth/Throat: Oropharynx is clear and moist.  Poor dentiton  Eyes: Conjunctivae are normal. Pupils are equal, round, and reactive to light.  Neck: Normal range of motion.  Cardiovascular: Normal rate, regular rhythm and normal heart sounds.  occas ectopy  Pulmonary/Chest: Effort normal and breath sounds normal.  Musculoskeletal: Normal range of motion. She  exhibits no edema.  Lymphadenopathy:    She has no cervical adenopathy.  Neurological: She is alert and oriented to person, place, and time.  Slow movements. Antalgic gait  Skin: Skin is warm and dry.  Psychiatric: She has a normal mood and affect. Her behavior is normal.  Sad mood    ASSESSMENT/PLAN:  1. Chronic periodontal disease Needs teeth pulled prior to hip replacement.  Referral placed  2. Essential hypertension Well-controlled - Lipid panel  3. Morbid obesity (HCC) Discussed diet, portions, calories, exercise  4. Pre-diabetes Discussed carbohydrates, sweets, lifestyle changes - CBC - COMPLETE METABOLIC PANEL WITH GFR - Urinalysis, Routine w reflex microscopic - Hemoglobin A1c   Patient Instructions  No change in medicines Walk every day that your joints allow Watch the carbohydrates and sweets in your diet  See me in 6 months Need labs next visit   Raylene Everts, MD

## 2017-08-20 NOTE — Patient Instructions (Signed)
No change in medicines Walk every day that your joints allow Watch the carbohydrates and sweets in your diet  See me in 6 months Need labs next visit

## 2017-09-03 ENCOUNTER — Ambulatory Visit: Payer: Medicare Other | Admitting: Family Medicine

## 2017-11-03 ENCOUNTER — Other Ambulatory Visit: Payer: Self-pay | Admitting: Family Medicine

## 2017-11-17 ENCOUNTER — Other Ambulatory Visit: Payer: Self-pay

## 2017-11-17 MED ORDER — GABAPENTIN 300 MG PO CAPS: 300.0000 mg | ORAL_CAPSULE | Freq: Three times a day (TID) | ORAL | 3 refills | Status: DC

## 2017-11-17 MED ORDER — LISINOPRIL-HYDROCHLOROTHIAZIDE 20-25 MG PO TABS: 1.0000 | ORAL_TABLET | Freq: Every day | ORAL | 3 refills | Status: DC

## 2017-11-17 MED ORDER — TRAMADOL HCL 50 MG PO TABS: 50.0000 mg | ORAL_TABLET | Freq: Four times a day (QID) | ORAL | 0 refills | Status: DC | PRN

## 2017-11-17 NOTE — Telephone Encounter (Signed)
PRESCRIPTIONS Total Prescriptions: 20  Total Private Pay: 63  Fill Date ID Written Drug Qty Days Prescriber Rx # Pharmacy Refill Daily Dose * Pymt Type PMP  10/03/2017  3   08/20/2017  Tramadol Hcl 50 MG Tablet  135 33 Yv Nel  [id number] Nor ([id number])  1 20.45 MME  Private Pay  [place]  09/02/2017  3   08/20/2017  Tramadol Hcl 50 MG Tablet  135 33 Yv Nel  [id number] Nor ([id number])  0 20.45 MME  Private Pay  [place]  08/16/2017  3   05/20/2017  Tramadol Hcl 50 MG Tablet  30 8 Yv Nel  [id number] Nor ([id number])  2 18.75 MME  Private Pay  [place]  07/03/2017  1   05/20/2017  Tramadol Hcl 50 MG Tablet  120 30 Yv Nel  [id number] Nor ([id number])  1 20.00 MME  Private Pay  [place]  05/20/2017  1   05/20/2017  Tramadol Hcl 50 MG Tablet  120 30 Yv Nel  [id number] Nor ([id number])  0 20.00 MME  Private Pay  [place]  04/23/2017  2   04/01/2017  Tramadol Hcl 50 MG Tablet  90 45 Yv Nel  [id number] [facility] ([id number])  0 10.00 MME  Private Pay  [place]  03/27/2017  1   03/27/2017  Tramadol Hcl 50 MG Tablet  90 30 Da She  [id number] Nor ([id number])  0 15.00 MME  Private Pay  [place]  02/26/2017  1   01/28/2017  Tramadol Hcl 50 MG Tablet  90 30 Da She  [id number] Nor ([id number])  0 15.00 MME  Private Pay  [place]  01/29/2017  1   01/28/2017  Tramadol Hcl 50 MG Tablet  90 30 Da She  [id number] Nor ([id number])  0 15.00 MME  Private Pay  [place]  01/17/2017  1   01/17/2017  Tramadol Hcl 50 MG Tablet  30 15 Yv Nel  [id number] Nor ([id number])  0 10.00 MME  Private Pay  [place]  12/31/2016  1   10/31/2016  Zolpidem Tartrate 10 MG Tablet  30 30 Da She  [id number] Nor ([id number])  2 0.50 LME  Private Pay  [place]  12/04/2016  1   12/04/2016  Tramadol Hcl 50 MG Tablet  180 90 Yv Nel  [id number] Nor ([id number])  0 10.00 MME  Private Pay  [place]  12/04/2016  1   10/31/2016  Zolpidem Tartrate 10 MG Tablet  30 30 Da She  [id number] Nor ([id number])  1 0.50 LME  Private Pay  [place]  11/19/2016  1   11/19/2016  Tramadol Hcl 50 MG Tablet  20 5 Yv Nel  [id number] Nor ([id number])  0 20.00 MME  Private Pay  [place]  10/31/2016  1   10/31/2016  Zolpidem Tartrate 10 MG Tablet  30 30 Da She  [id number]  Nor ([id number])  0 0.50 LME  Private Pay  [place]  10/16/2016  1   10/16/2016  Tramadol Hcl 50 MG Tablet  90 30 Ia Kle  [id number] Nor ([id number])  0 15.00 MME  Private Pay  [place]  09/17/2016  1   09/17/2016  Tramadol Hcl 50 MG Tablet  90 30 Da She  [id number] Nor ([id number])  0 15.00 MME  Private Pay  [place]  09/17/2016  1   09/17/2016  Zolpidem Tartrate 10 MG Tablet  30 30 Da She  [id number] Nor ([id number])  0 0.50 LME  Private Pay  [place]  08/16/2016  1   07/18/2016  Tramadol Hcl 50 MG Tablet  66 30 Da She  [id number] Nor ([id number])  0 15.00 MME  Private Pay  [place]  06/13/2016  1   04/30/2016  Tramadol Hcl 50 MG Tablet  90 30 Da She  [id number] Nor ([id number])  0 15.00 MME  Medicare  [place]

## 2017-11-26 ENCOUNTER — Other Ambulatory Visit: Payer: Self-pay

## 2017-11-28 ENCOUNTER — Other Ambulatory Visit: Payer: Self-pay

## 2017-12-22 ENCOUNTER — Encounter: Payer: Self-pay | Admitting: Family Medicine

## 2018-01-12 ENCOUNTER — Encounter: Payer: Self-pay | Admitting: Family Medicine

## 2018-01-12 ENCOUNTER — Other Ambulatory Visit: Payer: Self-pay

## 2018-01-12 ENCOUNTER — Ambulatory Visit: Payer: Medicare Other | Admitting: Family Medicine

## 2018-01-12 VITALS — BP 138/60 | HR 99 | Temp 98.6°F | Resp 12 | Ht 62.0 in | Wt 227.0 lb

## 2018-01-12 DIAGNOSIS — I10 Essential (primary) hypertension: Secondary | ICD-10-CM

## 2018-01-12 DIAGNOSIS — R7303 Prediabetes: Secondary | ICD-10-CM

## 2018-01-12 DIAGNOSIS — M1991 Primary osteoarthritis, unspecified site: Secondary | ICD-10-CM

## 2018-01-12 DIAGNOSIS — E559 Vitamin D deficiency, unspecified: Secondary | ICD-10-CM

## 2018-01-12 DIAGNOSIS — Z1239 Encounter for other screening for malignant neoplasm of breast: Secondary | ICD-10-CM

## 2018-01-12 DIAGNOSIS — Z1231 Encounter for screening mammogram for malignant neoplasm of breast: Secondary | ICD-10-CM

## 2018-01-12 MED ORDER — TRAMADOL HCL 50 MG PO TABS: 50.0000 mg | ORAL_TABLET | Freq: Four times a day (QID) | ORAL | 0 refills | Status: DC | PRN

## 2018-01-12 MED ORDER — GABAPENTIN 400 MG PO CAPS: 400.0000 mg | ORAL_CAPSULE | Freq: Three times a day (TID) | ORAL | 0 refills | Status: DC

## 2018-01-12 NOTE — Patient Instructions (Addendum)
I am going to increase the gabapentin to 400 mg three times a day  I am refilling the tramadol  Your blood pressure is good  Consider shingles shots  Stay as active as you can manage  Mammogram is due

## 2018-01-12 NOTE — Progress Notes (Signed)
Chief Complaint  Patient presents with  . Follow-up   Ms. Diefendorf is here for follow-up. She is still in the process of obtaining dental care in order to treat her tooth and gum disease. After this she plans to get her hip replaced.  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  This limits her ability to exercise. She states that because her hip is hurt for so long her knee is starting to bother her now.  I explained her that an abnormal gait pattern from an orthopedic problem can sometimes strain surrounding joints. She continues to take tramadol on a daily basis for pain.  She knows that I am leaving the practice.  I will give her a 30-day supply.  She needs to find another provider before this runs out. She takes gabapentin for pain.  300 mg 3 times daily.  She states it is not strong enough.  I will increase to 400 3 times daily.  She is warned that increasing gabapentin dose sometimes causes drowsiness. She has prediabetes.  She is obese.  Her weight keeps going up.  She states that she knows that she is eating too much, she knows she is exercising too little, and she declines a referral for nutrition.  She states I am "a lifelong member of [employer] and now what I should do". I reviewed her over due health maintenance.  She agrees to get a mammogram.  This is ordered for her today.  Patient Active Problem List   Diagnosis Date Noted  . Tubular adenoma of colon 12/11/2016  . Dyspnea 12/11/2016  . Vitamin D deficiency 11/21/2016  . Pre-diabetes 11/21/2016  . Essential hypertension 11/19/2016  . Depression, recurrent ([place]) 11/19/2016  . Osteoarthritis 11/19/2016  . Chronic back pain greater than 3 months duration 11/19/2016  . History of cervical dysplasia 11/19/2016  . Morbid obesity ([place]) 11/19/2016    Outpatient Encounter Medications as of 01/12/2018  Medication Sig  . Blood Pressure Monitor MISC Disp. 1 b/p monitor.  . gabapentin (NEURONTIN) 400 MG capsule Take 1 capsule  (400 mg total) by mouth 3 (three) times daily.  Marland Kitchen lisinopril-hydrochlorothiazide (PRINZIDE,ZESTORETIC) 20-25 MG tablet Take 1 tablet by mouth daily.  . traMADol (ULTRAM) 50 MG tablet Take 1 tablet (50 mg total) by mouth every 6 (six) hours as needed.  Marland Kitchen amLODipine (NORVASC) 5 MG tablet Take 1 tablet (5 mg total) by mouth daily. (Patient not taking: Reported on 01/12/2018)  . fluocinonide-emollient (LIDEX-E) 0.05 % cream Apply 1 application topically 2 (two) times daily. To right leg (Patient not taking: Reported on 01/12/2018)   No facility-administered encounter medications on file as of 01/12/2018.     Allergies  Allergen Reactions  . Aspirin     Review of Systems  Constitutional: Positive for unexpected weight change. Negative for activity change and appetite change.       Gain  HENT: Positive for dental problem. Negative for congestion, postnasal drip and rhinorrhea.   Eyes: Negative for redness and visual disturbance.  Respiratory: Negative for cough and shortness of breath.   Cardiovascular: Negative for chest pain, palpitations and leg swelling.  Gastrointestinal: Negative for abdominal pain, constipation and diarrhea.  Genitourinary: Negative for difficulty urinating, frequency, menstrual problem and vaginal bleeding.  Musculoskeletal: Positive for arthralgias, back pain and gait problem.  Neurological: Negative for dizziness and headaches.  Psychiatric/Behavioral: Positive for dysphoric mood. Negative for sleep disturbance. The patient is nervous/anxious.        Frustrated with her  obesity and inability to motivate herself to lose weight    BP 138/60   Pulse 99   Temp 98.6 F (37 C)   Resp 12   Ht 5\' 2"  (1.575 m)   Wt 227 lb 0.6 oz (103 kg)   LMP 10/15/2007 (Approximate)   SpO2 98%   BMI 41.53 kg/m   Physical Exam  Constitutional: She is oriented to person, place, and time. She appears well-developed and well-nourished. No distress.  Obese  HENT:  Head: Normocephalic  and atraumatic.  Mouth/Throat: Oropharynx is clear and moist.  Poor dentiton  Eyes: Pupils are equal, round, and reactive to light. Conjunctivae are normal.  Neck: Normal range of motion.  Cardiovascular: Normal rate, regular rhythm and normal heart sounds.  No ectopy in 60 seconds auscultation  Pulmonary/Chest: Effort normal and breath sounds normal.  Musculoskeletal: Normal range of motion. She exhibits no edema.  Lymphadenopathy:    She has no cervical adenopathy.  Neurological: She is alert and oriented to person, place, and time.  Slow movements. Antalgic gait  Skin: Skin is warm and dry.  Psychiatric: She has a normal mood and affect. Her behavior is normal.  Sad mood    ASSESSMENT/PLAN:  1. Essential hypertension Controlled  2. Pre-diabetes Discussed the importance of diet and exercise  3. Primary osteoarthritis, unspecified site Hip, likely knee  4. Vitamin D deficiency By history,  we will reevaluate  5. Screening for breast cancer Due - MM Digital Screening; Future   Patient Instructions  I am going to increase the gabapentin to 400 mg three times a day  I am refilling the tramadol  Your blood pressure is good  Consider shingles shots  Stay as active as you can manage  Mammogram is due   Raylene Everts, MD

## 2018-01-14 ENCOUNTER — Other Ambulatory Visit: Payer: Self-pay | Admitting: Family Medicine

## 2018-02-18 ENCOUNTER — Ambulatory Visit: Payer: Medicare Other | Admitting: Family Medicine

## 2018-02-25 ENCOUNTER — Ambulatory Visit (HOSPITAL_COMMUNITY): Payer: Medicare PPO

## 2018-06-01 ENCOUNTER — Other Ambulatory Visit: Payer: Self-pay | Admitting: Family Medicine

## 2018-10-21 DIAGNOSIS — M545 Low back pain: Secondary | ICD-10-CM | POA: Diagnosis not present

## 2018-10-21 DIAGNOSIS — N390 Urinary tract infection, site not specified: Secondary | ICD-10-CM | POA: Diagnosis not present

## 2018-10-21 DIAGNOSIS — R2689 Other abnormalities of gait and mobility: Secondary | ICD-10-CM | POA: Diagnosis not present

## 2018-10-21 DIAGNOSIS — R42 Dizziness and giddiness: Secondary | ICD-10-CM | POA: Diagnosis not present

## 2018-10-21 DIAGNOSIS — I10 Essential (primary) hypertension: Secondary | ICD-10-CM | POA: Diagnosis not present

## 2018-11-04 DIAGNOSIS — R7301 Impaired fasting glucose: Secondary | ICD-10-CM | POA: Diagnosis not present

## 2018-11-04 DIAGNOSIS — I10 Essential (primary) hypertension: Secondary | ICD-10-CM | POA: Diagnosis not present

## 2018-11-10 DIAGNOSIS — I10 Essential (primary) hypertension: Secondary | ICD-10-CM | POA: Diagnosis not present

## 2018-11-10 DIAGNOSIS — M545 Low back pain: Secondary | ICD-10-CM | POA: Diagnosis not present

## 2018-11-10 DIAGNOSIS — R7301 Impaired fasting glucose: Secondary | ICD-10-CM | POA: Diagnosis not present

## 2019-01-13 DIAGNOSIS — I10 Essential (primary) hypertension: Secondary | ICD-10-CM | POA: Diagnosis not present

## 2019-01-13 DIAGNOSIS — R7301 Impaired fasting glucose: Secondary | ICD-10-CM | POA: Diagnosis not present

## 2019-01-13 DIAGNOSIS — M545 Low back pain: Secondary | ICD-10-CM | POA: Diagnosis not present

## 2019-02-01 DIAGNOSIS — N390 Urinary tract infection, site not specified: Secondary | ICD-10-CM | POA: Diagnosis not present

## 2019-02-16 ENCOUNTER — Other Ambulatory Visit: Payer: Self-pay | Admitting: Internal Medicine

## 2019-02-16 DIAGNOSIS — E2839 Other primary ovarian failure: Secondary | ICD-10-CM

## 2019-02-16 DIAGNOSIS — Z0000 Encounter for general adult medical examination without abnormal findings: Secondary | ICD-10-CM | POA: Diagnosis not present

## 2019-04-08 DIAGNOSIS — M79604 Pain in right leg: Secondary | ICD-10-CM | POA: Diagnosis not present

## 2019-04-08 DIAGNOSIS — M79605 Pain in left leg: Secondary | ICD-10-CM | POA: Diagnosis not present

## 2019-04-08 DIAGNOSIS — M1990 Unspecified osteoarthritis, unspecified site: Secondary | ICD-10-CM | POA: Diagnosis not present

## 2019-04-22 ENCOUNTER — Ambulatory Visit (INDEPENDENT_AMBULATORY_CARE_PROVIDER_SITE_OTHER): Payer: Medicare Other | Admitting: Orthopaedic Surgery

## 2019-04-22 ENCOUNTER — Other Ambulatory Visit: Payer: Self-pay

## 2019-04-22 ENCOUNTER — Ambulatory Visit (INDEPENDENT_AMBULATORY_CARE_PROVIDER_SITE_OTHER): Payer: Medicare Other

## 2019-04-22 ENCOUNTER — Encounter: Payer: Self-pay | Admitting: Orthopaedic Surgery

## 2019-04-22 VITALS — BP 186/96 | HR 98 | Ht 62.0 in | Wt 214.0 lb

## 2019-04-22 DIAGNOSIS — Z96641 Presence of right artificial hip joint: Secondary | ICD-10-CM | POA: Diagnosis not present

## 2019-04-22 DIAGNOSIS — Z1321 Encounter for screening for nutritional disorder: Secondary | ICD-10-CM | POA: Diagnosis not present

## 2019-04-22 DIAGNOSIS — M25552 Pain in left hip: Secondary | ICD-10-CM

## 2019-04-22 DIAGNOSIS — I10 Essential (primary) hypertension: Secondary | ICD-10-CM | POA: Diagnosis not present

## 2019-04-22 DIAGNOSIS — R7301 Impaired fasting glucose: Secondary | ICD-10-CM | POA: Diagnosis not present

## 2019-04-22 NOTE — Progress Notes (Signed)
Patient TZ:GYFVCBSWH Sarah Cooper, female DOB:March 29, 1951, 68 y.o. QPR:[id number]  Chief Complaint  Patient presents with  . Hip Pain    Bilat/Sharp like a knife/throbbing/achy    HPI  Sarah Cooper is a 68 y.o. female who has worsening of the left hip pain.  She fell out of bed recently and it made the hip worse. She was given prednisone by her family doctor and got better. She has more pain constantly in the left hip now.  She is post right total hip.  She knows she may need hip on the left now. She is worried about the COVID-19 and post op problems.    We had a long talk after getting the X-rays of the pelvis showing worsening of the DJD.  We talked about 25 minutes.  She will get dental work done.  She will discuss with family.  She wants to be seen as needed.     Body mass index is 39.14 kg/m.  ROS  Review of Systems  Constitutional: Positive for activity change.  Musculoskeletal: Positive for arthralgias, gait problem and joint swelling.  Psychiatric/Behavioral: The patient is nervous/anxious.   All other systems reviewed and are negative.   All other systems reviewed and are negative.  The following is a summary of the past history medically, past history surgically, known current medicines, social history and family history.  This information is gathered electronically by the computer from prior information and documentation.  I review this each visit and have found including this information at this point in the chart is beneficial and informative.    Past Medical History:  Diagnosis Date  . Anxiety   . Arthritis   . Cancer ([place]) 1978   cervical  . Depression   . Hypertension     Past Surgical History:  Procedure Laterality Date  . HIP SURGERY  2007   right  . JOINT REPLACEMENT  2007    Family History  Problem Relation Age of Onset  . Heart disease Mother 7  . Arthritis Mother   . Diabetes Mother   . Alcohol abuse Father   . Cancer Father 22        lung  . Drug abuse Daughter   . Asthma Son   . Hypertension Son   . Mental illness Maternal Aunt   . Diabetes Maternal Grandmother   . Diabetes Paternal Grandmother   . Mental illness Daughter        schizophrenia  . Depression Daughter   . Mental illness Daughter        anxiety  . Obesity Daughter        bariatric surgery  . Heart disease Maternal Aunt     Social History Social History   Tobacco Use  . Smoking status: Former Smoker    Packs/day: 1.00    Types: Cigarettes    Start date: 1966    Quit date: 10/14/2010    Years since quitting: 8.5  . Smokeless tobacco: Never Used  Substance Use Topics  . Alcohol use: Yes    Alcohol/week: 3.0 standard drinks    Types: 3 Glasses of wine per week  . Drug use: No    Allergies  Allergen Reactions  . Aspirin     Current Outpatient Medications  Medication Sig Dispense Refill  . amLODipine (NORVASC) 5 MG tablet Take 1 tablet (5 mg total) by mouth daily. (Patient not taking: Reported on 01/12/2018) 90 tablet 3  . Blood Pressure Monitor MISC Disp. 1 b/p  monitor. 1 each 0  . fluocinonide-emollient (LIDEX-E) 0.05 % cream Apply 1 application topically 2 (two) times daily. To right leg (Patient not taking: Reported on 01/12/2018) 15 g 0  . gabapentin (NEURONTIN) 400 MG capsule Take 1 capsule (400 mg total) by mouth 3 (three) times daily. 90 capsule 0  . lisinopril-hydrochlorothiazide (PRINZIDE,ZESTORETIC) 20-25 MG tablet Take 1 tablet by mouth daily. 90 tablet 3  . traMADol (ULTRAM) 50 MG tablet Take 1 tablet (50 mg total) by mouth every 6 (six) hours as needed. 120 tablet 0   No current facility-administered medications for this visit.      Physical Exam  Blood pressure (!) 186/96, pulse 98, height 5\' 2"  (1.575 m), weight 214 lb (97.1 kg), last menstrual period 10/15/2007.  Constitutional: overall normal hygiene, normal nutrition, well developed, normal grooming, normal body habitus. Assistive  device:none  Musculoskeletal: gait and station Limp left, muscle tone and strength are normal, no tremors or atrophy is present.  .  Neurological: coordination overall normal.  Deep tendon reflex/nerve stretch intact.  Sensation normal.  Cranial nerves II-XII intact.   Skin:   Normal overall no scars, lesions, ulcers or rashes. No psoriasis.  Psychiatric: Alert and oriented x 3.  Recent memory intact, remote memory unclear.  Normal mood and affect. Well groomed.  Good eye contact.  Cardiovascular: overall no swelling, no varicosities, no edema bilaterally, normal temperatures of the legs and arms, no clubbing, cyanosis and good capillary refill.  Lymphatic: palpation is normal.  Left hip has decreased motion and pain.  NV intact.  All other systems reviewed and are negative   The patient has been educated about the nature of the problem(s) and counseled on treatment options.  The patient appeared to understand what I have discussed and is in agreement with it.  X-rays were done of the pelvis and left hip, reported separately.  Encounter Diagnoses  Name Primary?  . Pain in left hip Yes  . Status post total hip replacement, right     PLAN Call if any problems.  Precautions discussed.  Continue current medications.   Return to clinic as needed   Electronically Signed Sanjuana Kava, MD 7/9/202010:12 AM

## 2019-04-27 DIAGNOSIS — M545 Low back pain: Secondary | ICD-10-CM | POA: Diagnosis not present

## 2019-04-27 DIAGNOSIS — E782 Mixed hyperlipidemia: Secondary | ICD-10-CM | POA: Diagnosis not present

## 2019-04-27 DIAGNOSIS — I10 Essential (primary) hypertension: Secondary | ICD-10-CM | POA: Diagnosis not present

## 2019-04-27 DIAGNOSIS — R7301 Impaired fasting glucose: Secondary | ICD-10-CM | POA: Diagnosis not present

## 2019-06-02 DIAGNOSIS — I10 Essential (primary) hypertension: Secondary | ICD-10-CM | POA: Diagnosis not present

## 2019-06-02 DIAGNOSIS — Z0000 Encounter for general adult medical examination without abnormal findings: Secondary | ICD-10-CM | POA: Diagnosis not present

## 2019-06-02 DIAGNOSIS — R7301 Impaired fasting glucose: Secondary | ICD-10-CM | POA: Diagnosis not present

## 2019-06-03 DIAGNOSIS — I10 Essential (primary) hypertension: Secondary | ICD-10-CM | POA: Diagnosis not present

## 2019-06-03 DIAGNOSIS — M19041 Primary osteoarthritis, right hand: Secondary | ICD-10-CM | POA: Diagnosis not present

## 2019-06-10 ENCOUNTER — Other Ambulatory Visit: Payer: Self-pay | Admitting: Internal Medicine

## 2019-06-10 DIAGNOSIS — Z1231 Encounter for screening mammogram for malignant neoplasm of breast: Secondary | ICD-10-CM

## 2019-07-19 DIAGNOSIS — I10 Essential (primary) hypertension: Secondary | ICD-10-CM | POA: Diagnosis not present

## 2019-07-19 DIAGNOSIS — Z1321 Encounter for screening for nutritional disorder: Secondary | ICD-10-CM | POA: Diagnosis not present

## 2019-07-19 DIAGNOSIS — R7301 Impaired fasting glucose: Secondary | ICD-10-CM | POA: Diagnosis not present

## 2019-07-26 DIAGNOSIS — I10 Essential (primary) hypertension: Secondary | ICD-10-CM | POA: Diagnosis not present

## 2019-07-26 DIAGNOSIS — R0602 Shortness of breath: Secondary | ICD-10-CM | POA: Diagnosis not present

## 2019-07-26 DIAGNOSIS — M19041 Primary osteoarthritis, right hand: Secondary | ICD-10-CM | POA: Diagnosis not present

## 2019-07-26 DIAGNOSIS — R7303 Prediabetes: Secondary | ICD-10-CM | POA: Diagnosis not present

## 2019-07-27 ENCOUNTER — Ambulatory Visit
Admission: RE | Admit: 2019-07-27 | Discharge: 2019-07-27 | Disposition: A | Discharge status: Home / Self Care | Payer: Medicare Other | Source: Ambulatory Visit | Attending: Internal Medicine | Admitting: Internal Medicine

## 2019-07-27 ENCOUNTER — Other Ambulatory Visit: Payer: Self-pay

## 2019-07-27 DIAGNOSIS — Z1231 Encounter for screening mammogram for malignant neoplasm of breast: Secondary | ICD-10-CM | POA: Diagnosis not present

## 2019-08-06 ENCOUNTER — Encounter: Payer: Self-pay | Admitting: Cardiology

## 2019-08-06 ENCOUNTER — Ambulatory Visit: Payer: Medicare Other | Admitting: Cardiology

## 2019-08-06 ENCOUNTER — Other Ambulatory Visit: Payer: Self-pay

## 2019-08-06 VITALS — BP 183/84 | HR 93 | Temp 96.0°F | Ht 62.0 in | Wt 215.0 lb

## 2019-08-06 DIAGNOSIS — R0789 Other chest pain: Secondary | ICD-10-CM | POA: Diagnosis not present

## 2019-08-06 DIAGNOSIS — R0989 Other specified symptoms and signs involving the circulatory and respiratory systems: Secondary | ICD-10-CM

## 2019-08-06 DIAGNOSIS — R7303 Prediabetes: Secondary | ICD-10-CM

## 2019-08-06 DIAGNOSIS — R0602 Shortness of breath: Secondary | ICD-10-CM

## 2019-08-06 DIAGNOSIS — I10 Essential (primary) hypertension: Secondary | ICD-10-CM | POA: Diagnosis not present

## 2019-08-06 MED ORDER — AMLODIPINE BESYLATE 10 MG PO TABS: 10.0000 mg | ORAL_TABLET | Freq: Every day | ORAL | 0 refills | Status: DC

## 2019-08-06 NOTE — Progress Notes (Signed)
Primary Physician/Referring:  Celene Squibb, MD  Patient ID: Sarah Cooper, female    DOB: 1951/08/24, 68 y.o.   MRN: [id number]  Chief Complaint  Patient presents with  . Shortness of Breath  . Hypertension  . New Patient (Initial Visit)    sob w/exertion   HPI:    Sarah Cooper  is a 68 y.o. African American female with hypertension, arthritis, elevated BMI (BMI 39), hyperlipidemia, prediabetes, and insomnia presenting to establish care for further evaluation of shortness of breath.  Endorses several month history (since around 11/2018) of dyspnea on exertion. Not SOB at rest. She believes this is related to weight gain, gained 15 lbs (after losing 25lbs to 200lbs, now 215) shortly after the new year when her daughter in law passed away of cancer. She had felt this way (DOE) before she lost the 25 lbs, however with that weight loss her SOB had resolved. Notes feeling easily fatigued and SOB after walking for about 5-10 minutes. Trys to walk atleast 40 minutes total daily with her dog, can continue to walk despite feel exerted. Used to do water aerobics 3x a week but hasn't be able to with COVID. Also feels like she can hear herself wheeze on occasion, no history of asthma or COPD. Former smoker, 50 pack year history.   On occasion, will have some chest pressure that lasts a minute or so, no relation to activity, "random" timing. No associated concurrent SOB, diaphoresis, or radiation of this pain. Has been told she has an irregular heart rate in the past, endorses feeling irregular beats sometimes with activity or at rest, but seems to notice more at night. Sleeps with 3 pillows for the past several years, has trouble with insomnia. Denies any PND, claudication symptoms. Mild LE swelling chronically with varicose veins, swelling has not worsened since starting Norvasc.    Currently takes HCTZ-lisinopril 25-20 mg and amlodipine 5 mg for blood pressure.  BP 120/68 at her PCPs  office on 10/12.  States it is normally pretty good at home however says her blood pressure has always been "high ".  Past Medical History:  Diagnosis Date  . Anxiety   . Arthritis   . Cancer ([place]) 1978   cervical  . Depression   . Hypertension    Past Surgical History:  Procedure Laterality Date  . BREAST BIOPSY Left    approx @ 48 benign   . HIP SURGERY  2007   right  . JOINT REPLACEMENT  2007   Social History   Socioeconomic History  . Marital status: Single    Spouse name: Not on file  . Number of children: 4  . Years of education: Not on file  . Highest education level: Not on file  Occupational History  . Not on file  Social Needs  . Financial resource strain: Not on file  . Food insecurity    Worry: Not on file    Inability: Not on file  . Transportation needs    Medical: Not on file    Non-medical: Not on file  Tobacco Use  . Smoking status: Former Smoker    Packs/day: 1.00    Types: Cigarettes    Start date: 1966    Quit date: 10/14/2010    Years since quitting: 8.8  . Smokeless tobacco: Never Used  Substance and Sexual Activity  . Alcohol use: Yes    Alcohol/week: 3.0 standard drinks    Types: 3 Glasses of wine per week  .  Drug use: No  . Sexual activity: Never  Lifestyle  . Physical activity    Days per week: Not on file    Minutes per session: Not on file  . Stress: Not on file  Relationships  . Social Herbalist on phone: Not on file    Gets together: Not on file    Attends religious service: Not on file    Active member of club or organization: Not on file    Attends meetings of clubs or organizations: Not on file    Relationship status: Not on file  . Intimate partner violence    Fear of current or ex partner: Not on file    Emotionally abused: Not on file    Physically abused: Not on file    Forced sexual activity: Not on file  Other Topics Concern  . Not on file  Social History Narrative  . Not on file   ROS  *Review  of Systems  Constitution: Positive for weight gain. Negative for chills, fever and malaise/fatigue.  HENT: Negative for congestion.   Eyes: Negative for blurred vision.  Cardiovascular: Positive for dyspnea on exertion, irregular heartbeat and leg swelling. Negative for claudication, cyanosis, near-syncope, paroxysmal nocturnal dyspnea and syncope.  Respiratory: Positive for shortness of breath and snoring. Negative for cough and sputum production.   Skin: Negative for flushing.  Musculoskeletal: Positive for arthritis.  Gastrointestinal: Negative for abdominal pain and change in bowel habit.  Neurological: Negative for excessive daytime sleepiness, focal weakness, headaches and weakness.  Psychiatric/Behavioral: Negative for altered mental status and suicidal ideas.   Objective   Vitals with BMI 08/06/2019 04/22/2019 01/12/2018  Height 5\' 2"  5\' 2"  5\' 2"   Weight 215 lbs 214 lbs 227 lbs 1 oz  BMI 39.31 [id number] [id number]  Systolic [id number]  Diastolic 84 96 60  Pulse 93 98 99    Blood pressure (!) 183/84, pulse 93, temperature (!) 96 F (35.6 C), height 5\' 2"  (1.575 m), weight 215 lb (97.5 kg), last menstrual period 10/15/2007, SpO2 97 %. Body mass index is 39.32 kg/m. Recheck after sitting down for >15 minutes was 160/82.    *Physical Exam  Constitutional: She is oriented to person, place, and time. She appears well-developed and well-nourished. No distress.  Eyes: Conjunctivae and EOM are normal. No scleral icterus.  Neck: Neck supple. No JVD present.  Cardiovascular: Normal rate. An irregular rhythm present.  Murmur heard.  Harsh midsystolic murmur is present with a grade of 2/6 at the upper right sternal border radiating to the neck. Pulses:      Carotid pulses are on the right side with bruit and on the left side with bruit.      Radial pulses are 2+ on the right side and 2+ on the left side.       Popliteal pulses are 2+ on the right side and 2+ on the left side.       Dorsalis  pedis pulses are 1+ on the right side and 2+ on the left side.       Posterior tibial pulses are 2+ on the right side and 0 on the left side.  Pulmonary/Chest: Effort normal and breath sounds normal. She has no wheezes.  Abdominal: Soft. There is no abdominal tenderness.  Neurological: She is alert and oriented to person, place, and time.  Skin: Skin is warm and dry. She is not diaphoretic.  Psychiatric: She has a normal mood and affect.  Laboratory examination:   No results for input(s): NA, K, CL, CO2, GLUCOSE, BUN, CREATININE, CALCIUM, GFRNONAA, GFRAA in the last 8760 hours. CMP Latest Ref Rng & Units 06/17/2017 11/19/2016  Glucose 65 - 99 mg/dL 121(H) 111(H)  BUN 7 - 25 mg/dL 11 19  Creatinine 0.50 - 0.99 mg/dL 0.61 0.82  Sodium 135 - 146 mmol/L 139 140  Potassium 3.5 - 5.3 mmol/L 4.3 3.9  Chloride 98 - 110 mmol/L 102 104  CO2 20 - 32 mmol/L 29 28  Calcium 8.6 - 10.4 mg/dL 9.6 9.1  Total Protein 6.1 - 8.1 g/dL - 7.0  Total Bilirubin 0.2 - 1.2 mg/dL - 0.2  Alkaline Phos 33 - 130 U/L - 58  AST 10 - 35 U/L - 19  ALT 6 - 29 U/L - 17   CBC Latest Ref Rng & Units 11/19/2016  WBC 3.8 - 10.8 K/uL 6.5  Hemoglobin 11.7 - 15.5 g/dL 10.8(L)  Hematocrit 35.0 - 45.0 % 34.8(L)  Platelets 140 - 400 K/uL 281   Lipid Panel  Triglycerides 104, HDL 90, LDL 98 in 07/2019  HEMOGLOBIN A1C  A1c 5.9% in 07/2019  TSH No results for input(s): TSH in the last 8760 hours. Medications and allergies   Allergies  Allergen Reactions  . Aspirin      Prior to Admission medications   Medication Sig Start Date End Date Taking? Authorizing Provider  amLODipine (NORVASC) 5 MG tablet Take 1 tablet (5 mg total) by mouth daily. 03/03/17  Yes Raylene Everts, MD  Blood Pressure Monitor MISC Disp. 1 b/p monitor. 03/28/17  Yes Raylene Everts, MD  gabapentin (NEURONTIN) 400 MG capsule Take 1 capsule (400 mg total) by mouth 3 (three) times daily. 01/12/18  Yes Raylene Everts, MD   lisinopril-hydrochlorothiazide (PRINZIDE,ZESTORETIC) 20-25 MG tablet Take 1 tablet by mouth daily. 11/17/17  Yes Raylene Everts, MD  traMADol (ULTRAM) 50 MG tablet Take 1 tablet (50 mg total) by mouth every 6 (six) hours as needed. 01/12/18  Yes Raylene Everts, MD     Current Outpatient Medications  Medication Instructions  . amLODipine (NORVASC) 10 mg, Oral, Daily  . Blood Pressure Monitor MISC Disp. 1 b/p monitor.  . gabapentin (NEURONTIN) 400 mg, Oral, 3 times daily  . lisinopril-hydrochlorothiazide (PRINZIDE,ZESTORETIC) 20-25 MG tablet 1 tablet, Oral, Daily  . traMADol (ULTRAM) 50 mg, Oral, Every 6 hours PRN    Radiology:  No results found.   Cardiac Studies:  None  Assessment     ICD-10-CM   1. Shortness of breath  R06.02 EKG 12-Lead    PCV MYOCARDIAL PERFUSION WITH LEXISCAN    PCV ECHOCARDIOGRAM COMPLETE  2. Bilateral carotid bruits  R09.89 PCV CAROTID DUPLEX (BILATERAL)  3. Essential hypertension  I10 amLODipine (NORVASC) 10 MG tablet  4. Atypical chest pain  R07.89 PCV MYOCARDIAL PERFUSION WITH LEXISCAN    PCV ECHOCARDIOGRAM COMPLETE  5. Prediabetes  R73.03 PCV MYOCARDIAL PERFUSION WITH LEXISCAN    EKG 08/06/2019: Sinus rhythm with occasional ectopic beats.  Normal axis.  Recommendations:  Ms. Gildon is a pleasant 68 year old African-American female with prediabetes, hypertension, obesity, and former smoker presenting for further evaluation of dyspnea on exertion and atypical chest pain.  Suspect that her dyspnea on exertion may be multifactorial in the setting of obesity, deconditioning, ? Mild COPD with 37 PPD (low concern DOE is pulmonary in origin), vs underlying cardiovascular involvement.  Do appreciate bilateral carotid bruits, will obtain carotid ultrasounds to evaluate for plaque formation.  Also given  her cardiovascular risk factors, will have her undergo a Lexiscan nuclear stress test to assess CV capacity and echocardiogram to evaluate for diastolic  dysfunction and valvular abnormalities (2/6 systolic murmur noted in right second intercostal space).  Highly encouraged and counseled on increasing her physical activity and monitoring diet in hopes of weight reduction once again, especially as it seems majority of her symptoms resolved/improved with weight loss in the past.  She has been a longstanding member of weight watchers and motivated to be consistent with this program again.  Her blood pressure is also elevated in the office today on 3 separate checks throughout her visit and she endorses that it seems to be "high " on several occasions at home.  She is currently on lisinopril-HCTZ and amlodipine, will increase her amlodipine to 10 mg from 5.  Recommend continuing to monitor her BP periodically at home.  Follow-up in 4 weeks after studies completed.  Patriciaann Clan, DO 08/06/2019, 11:26 AM

## 2019-08-19 ENCOUNTER — Other Ambulatory Visit: Payer: Self-pay

## 2019-08-19 ENCOUNTER — Ambulatory Visit (INDEPENDENT_AMBULATORY_CARE_PROVIDER_SITE_OTHER): Payer: Medicare Other

## 2019-08-19 DIAGNOSIS — I6522 Occlusion and stenosis of left carotid artery: Secondary | ICD-10-CM | POA: Diagnosis not present

## 2019-08-19 DIAGNOSIS — R0789 Other chest pain: Secondary | ICD-10-CM

## 2019-08-19 DIAGNOSIS — R0989 Other specified symptoms and signs involving the circulatory and respiratory systems: Secondary | ICD-10-CM | POA: Diagnosis not present

## 2019-08-19 DIAGNOSIS — R0602 Shortness of breath: Secondary | ICD-10-CM | POA: Diagnosis not present

## 2019-08-22 ENCOUNTER — Other Ambulatory Visit: Payer: Self-pay | Admitting: Cardiology

## 2019-08-22 DIAGNOSIS — I6522 Occlusion and stenosis of left carotid artery: Secondary | ICD-10-CM

## 2019-08-22 DIAGNOSIS — R0989 Other specified symptoms and signs involving the circulatory and respiratory systems: Secondary | ICD-10-CM

## 2019-09-06 ENCOUNTER — Other Ambulatory Visit: Payer: Self-pay

## 2019-09-06 ENCOUNTER — Ambulatory Visit (INDEPENDENT_AMBULATORY_CARE_PROVIDER_SITE_OTHER): Payer: Medicare Other

## 2019-09-06 DIAGNOSIS — R0602 Shortness of breath: Secondary | ICD-10-CM | POA: Diagnosis not present

## 2019-09-06 DIAGNOSIS — R7303 Prediabetes: Secondary | ICD-10-CM

## 2019-09-06 DIAGNOSIS — R0789 Other chest pain: Secondary | ICD-10-CM | POA: Diagnosis not present

## 2019-09-16 ENCOUNTER — Encounter: Payer: Self-pay | Admitting: Cardiology

## 2019-09-16 ENCOUNTER — Other Ambulatory Visit: Payer: Self-pay | Admitting: Cardiology

## 2019-09-16 ENCOUNTER — Other Ambulatory Visit: Payer: Self-pay

## 2019-09-16 ENCOUNTER — Ambulatory Visit (INDEPENDENT_AMBULATORY_CARE_PROVIDER_SITE_OTHER): Payer: Medicare Other | Admitting: Cardiology

## 2019-09-16 VITALS — BP 130/81 | HR 98 | Ht 61.0 in | Wt 214.3 lb

## 2019-09-16 DIAGNOSIS — I10 Essential (primary) hypertension: Secondary | ICD-10-CM

## 2019-09-16 DIAGNOSIS — I351 Nonrheumatic aortic (valve) insufficiency: Secondary | ICD-10-CM | POA: Diagnosis not present

## 2019-09-16 DIAGNOSIS — I6522 Occlusion and stenosis of left carotid artery: Secondary | ICD-10-CM

## 2019-09-16 DIAGNOSIS — R0602 Shortness of breath: Secondary | ICD-10-CM | POA: Diagnosis not present

## 2019-09-16 DIAGNOSIS — R0789 Other chest pain: Secondary | ICD-10-CM | POA: Diagnosis not present

## 2019-09-16 DIAGNOSIS — E785 Hyperlipidemia, unspecified: Secondary | ICD-10-CM

## 2019-09-16 MED ORDER — ATORVASTATIN CALCIUM 10 MG PO TABS: 10.0000 mg | ORAL_TABLET | Freq: Every day | ORAL | 2 refills | Status: DC

## 2019-09-16 NOTE — Progress Notes (Signed)
Primary Physician/Referring:  Celene Squibb, MD  Patient ID: Sarah Cooper, female    DOB: 03-25-1951, 68 y.o.   MRN: [id number]  Chief Complaint  Patient presents with  . Shortness of Breath  . Chest Pain  . Results    echo, lexi, carotid  . Follow-up    6wk   HPI:    Sarah Cooper  is a 68 y.o. African American female with hypertension, arthritis, elevated BMI (BMI 39), hyperlipidemia, prediabetes, and insomnia recently established with [place] for shortness of breath. She underwent echocardiogram, lexiscan nuclear stress testing, and carotid duplex and presents to discuss results.   At her last office visit, amlodipine was increased to 10 mg daily. Over the last few months, patient has noticed dyspnea on exertion. She does admit to weight gain. Former smoker, 50 pack year history.   Denies any PND, claudication symptoms. Mild LE swelling chronically with varicose veins, swelling has not worsened since starting Norvasc.  Fortunately she has not had any chest discomfort since last office visit.  Tolerating the medications well.  Past Medical History:  Diagnosis Date  . Anxiety   . Arthritis   . Cancer ([place]) 1978   cervical  . Depression   . Hypertension    Past Surgical History:  Procedure Laterality Date  . BREAST BIOPSY Left    approx @ 48 benign   . HIP SURGERY  2007   right  . JOINT REPLACEMENT  2007   Social History   Socioeconomic History  . Marital status: Single    Spouse name: Not on file  . Number of children: 4  . Years of education: Not on file  . Highest education level: Not on file  Occupational History  . Not on file  Social Needs  . Financial resource strain: Not on file  . Food insecurity    Worry: Not on file    Inability: Not on file  . Transportation needs    Medical: Not on file    Non-medical: Not on file  Tobacco Use  . Smoking status: Former Smoker    Packs/day: 1.00    Years: 50.00    Pack years: 50.00    Types:  Cigarettes    Start date: 1966    Quit date: 10/14/2010    Years since quitting: 8.9  . Smokeless tobacco: Never Used  Substance and Sexual Activity  . Alcohol use: Yes    Alcohol/week: 3.0 standard drinks    Types: 3 Glasses of wine per week  . Drug use: No  . Sexual activity: Never  Lifestyle  . Physical activity    Days per week: Not on file    Minutes per session: Not on file  . Stress: Not on file  Relationships  . Social Herbalist on phone: Not on file    Gets together: Not on file    Attends religious service: Not on file    Active member of club or organization: Not on file    Attends meetings of clubs or organizations: Not on file    Relationship status: Not on file  . Intimate partner violence    Fear of current or ex partner: Not on file    Emotionally abused: Not on file    Physically abused: Not on file    Forced sexual activity: Not on file  Other Topics Concern  . Not on file  Social History Narrative  . Not on file  ROS  Review of Systems  Constitution: Negative for chills, fever, malaise/fatigue, weight gain and weight loss.  HENT: Negative for congestion.   Eyes: Negative for blurred vision.  Cardiovascular: Positive for irregular heartbeat and leg swelling (occasional). Negative for chest pain, claudication, cyanosis, near-syncope, paroxysmal nocturnal dyspnea and syncope.  Respiratory: Positive for shortness of breath and snoring. Negative for cough and sputum production.   Skin: Negative for flushing.  Musculoskeletal: Positive for arthritis.  Gastrointestinal: Negative for abdominal pain and change in bowel habit.  Neurological: Negative for excessive daytime sleepiness, focal weakness, headaches and weakness.  Psychiatric/Behavioral: Negative for altered mental status and suicidal ideas.   Objective   Vitals with BMI 09/16/2019 08/06/2019 04/22/2019  Height 5\' 1"  5\' 2"  5\' 2"   Weight 214 lbs 5 oz 215 lbs 214 lbs  BMI 40.51 [id number]   Systolic [id number]  Diastolic 81 84 96  Pulse 98 93 98    Blood pressure 130/81, pulse 98, height 5\' 1"  (1.549 m), weight 214 lb 4.8 oz (97.2 kg), last menstrual period 10/15/2007, SpO2 97 %. Body mass index is 40.49 kg/m. Recheck after sitting down for >15 minutes was 160/82.   Physical Exam  Constitutional: She is oriented to person, place, and time. She appears well-developed and well-nourished. No distress.  Eyes: Conjunctivae and EOM are normal. No scleral icterus.  Neck: Neck supple.  Cardiovascular: Normal rate. An irregular rhythm present.  Murmur heard.  Medium-pitched midsystolic murmur is present with a grade of 2/6 at the upper right sternal border radiating to the neck. Pulses:      Carotid pulses are on the right side with bruit and on the left side with bruit.      Radial pulses are 2+ on the right side and 2+ on the left side.       Popliteal pulses are 2+ on the right side and 2+ on the left side.       Dorsalis pedis pulses are 1+ on the right side and 2+ on the left side.       Posterior tibial pulses are 2+ on the right side and 0 on the left side.  No leg edema. No JVD.   Pulmonary/Chest: Effort normal and breath sounds normal. She has no wheezes.  Abdominal: Soft. There is no abdominal tenderness.  Musculoskeletal: Normal range of motion.  Neurological: She is alert and oriented to person, place, and time.  Skin: Skin is warm and dry. She is not diaphoretic.  Psychiatric: She has a normal mood and affect.   Laboratory examination:   No results for input(s): NA, K, CL, CO2, GLUCOSE, BUN, CREATININE, CALCIUM, GFRNONAA, GFRAA in the last 8760 hours. CMP Latest Ref Rng & Units 06/17/2017 11/19/2016  Glucose 65 - 99 mg/dL 121(H) 111(H)  BUN 7 - 25 mg/dL 11 19  Creatinine 0.50 - 0.99 mg/dL 0.61 0.82  Sodium 135 - 146 mmol/L 139 140  Potassium 3.5 - 5.3 mmol/L 4.3 3.9  Chloride 98 - 110 mmol/L 102 104  CO2 20 - 32 mmol/L 29 28  Calcium 8.6 - 10.4 mg/dL 9.6 9.1   Total Protein 6.1 - 8.1 g/dL - 7.0  Total Bilirubin 0.2 - 1.2 mg/dL - 0.2  Alkaline Phos 33 - 130 U/L - 58  AST 10 - 35 U/L - 19  ALT 6 - 29 U/L - 17   CBC Latest Ref Rng & Units 11/19/2016  WBC 3.8 - 10.8 K/uL 6.5  Hemoglobin 11.7 - 15.5 g/dL  10.8(L)  Hematocrit 35.0 - 45.0 % 34.8(L)  Platelets 140 - 400 K/uL 281   Lipid Panel  Triglycerides 104, HDL 90, LDL 98 in 07/2019  HEMOGLOBIN A1C  A1c 5.9% in 07/2019  TSH No results for input(s): TSH in the last 8760 hours. Medications and allergies   Allergies  Allergen Reactions  . Aspirin      Prior to Admission medications   Medication Sig Start Date End Date Taking? Authorizing Provider  amLODipine (NORVASC) 5 MG tablet Take 1 tablet (5 mg total) by mouth daily. 03/03/17  Yes Raylene Everts, MD  Blood Pressure Monitor MISC Disp. 1 b/p monitor. 03/28/17  Yes Raylene Everts, MD  gabapentin (NEURONTIN) 400 MG capsule Take 1 capsule (400 mg total) by mouth 3 (three) times daily. 01/12/18  Yes Raylene Everts, MD  lisinopril-hydrochlorothiazide (PRINZIDE,ZESTORETIC) 20-25 MG tablet Take 1 tablet by mouth daily. 11/17/17  Yes Raylene Everts, MD  traMADol (ULTRAM) 50 MG tablet Take 1 tablet (50 mg total) by mouth every 6 (six) hours as needed. 01/12/18  Yes Raylene Everts, MD     Current Outpatient Medications  Medication Instructions  . amLODipine (NORVASC) 10 mg, Oral, Daily  . atorvastatin (LIPITOR) 10 mg, Oral, Daily  . Blood Pressure Monitor MISC Disp. 1 b/p monitor.  . Cholecalciferol (VITAMIN D3 SUPER STRENGTH) 50 MCG (2000 UT) CAPS Oral, Daily  . gabapentin (NEURONTIN) 400 mg, Oral, 3 times daily  . ibuprofen (ADVIL) 800 mg, Oral, 3 times daily PRN  . lisinopril-hydrochlorothiazide (PRINZIDE,ZESTORETIC) 20-25 MG tablet 1 tablet, Oral, Daily  . Multiple Vitamins-Minerals (MULTIVITAMIN WOMEN PO) Oral, Daily  . QUEtiapine (SEROQUEL) 50 mg, Oral, At bedtime PRN  . traMADol (ULTRAM) 50 mg, Oral, Every 6 hours PRN     Radiology:  No results found.   Cardiac Studies:   Lexiscan Tetrofosmin stress test 09/06/2019: No previous exam available for comparison. Lexiscan nuclear stress test performed using 1-day protocol.  SPECT stress and rest images demonstrate normal myocardial perfusion. Septal dyskinesis with LVEF 30%. High risk study due to reduced LVEF. Recommend clinical correlation.   Echocardiogram 08/19/2019: Left ventricle cavity is normal in size. Mild concentric hypertrophy of the left ventricle. Normal LV systolic function with visual EF 55-60%. Normal global wall motion. Doppler evidence of grade I (impaired) diastolic dysfunction, normal LAP.  Trileaflet aortic valve. Mild to moderate aortic regurgitation. Mild (Grade I) mitral regurgitation. Normal right atrial pressure.   Carotid artery duplex  [id number]: Peak systolic velocities in the right bifurcation, internal, external and common carotid arteries are within normal limits. Left carotid stenosis in the range of 16-49% with moderate heterogeneous plaque. Antegrade right vertebral artery flow. Antegrade left vertebral artery flow. Follow up in one year is appropriate if clinically indicated.  Assessment     ICD-10-CM   1. Shortness of breath  R06.02   2. Atypical chest pain  R07.89   3. Essential hypertension  I10   4. Stenosis of left carotid artery  I65.22   5. Moderate aortic regurgitation  I35.1   6. Mild hyperlipidemia  E78.5 atorvastatin (LIPITOR) 10 MG tablet    Lipid Panel With LDL/HDL Ratio    EKG 08/06/2019: Sinus rhythm with occasional ectopic beats.  Normal axis.  Recommendations:   Sarah Cooper is a pleasant 68 year old African-American female with prediabetes, hypertension, obesity, and former smoker presenting for further evaluation of dyspnea on exertion and atypical chest pain.  Suspect that her dyspnea on exertion may be multifactorial in the setting  of obesity, deconditioning, ? Mild COPD with 50 PPD  Her  last office visit and increase the dose of amlodipine to 10 mg, and of the blood pressure is well controlled.  I discussed the findings of the echocardiogram revealing moderate aortic regurgitation, also mild left carotid artery stenosis and diffuse atherosclerotic changes noted in bilateral carotid arteries were discussed with the patient.   She has mild hyperlipidemia and she is willing to start atorvastatin 10 mg daily, we will obtain lipid profile testing in 6 weeks.  I'd like to see her back in 2 months along with discussion regarding weight loss management and if she remains stable I'll see her back on an annual basis for management of aortic regurgitation and also carotid stenosis.  Adrian Prows, MD, [facility] 09/16/2019, 12:25 PM [facility] [place] Pager: [phone] Office: [phone] If no answer Cell [phone]

## 2019-11-12 DIAGNOSIS — E785 Hyperlipidemia, unspecified: Secondary | ICD-10-CM | POA: Diagnosis not present

## 2019-11-13 LAB — LIPID PANEL WITH LDL/HDL RATIO
Cholesterol, Total: 160 mg/dL (ref 100–199)
HDL: 79 mg/dL (ref >39)
LDL Chol Calc (NIH): 61 mg/dL (ref 0–99)
LDL/HDL Ratio: 0.8 ratio (ref 0.0–3.2)
Triglycerides: 118 mg/dL (ref 0–149)
VLDL Cholesterol Cal: 20 mg/dL (ref 5–40)

## 2019-11-15 ENCOUNTER — Other Ambulatory Visit: Payer: Self-pay | Admitting: Cardiology

## 2019-11-15 DIAGNOSIS — E785 Hyperlipidemia, unspecified: Secondary | ICD-10-CM

## 2019-11-18 ENCOUNTER — Other Ambulatory Visit: Payer: Self-pay

## 2019-11-18 ENCOUNTER — Encounter: Payer: Self-pay | Admitting: Cardiology

## 2019-11-18 ENCOUNTER — Ambulatory Visit (INDEPENDENT_AMBULATORY_CARE_PROVIDER_SITE_OTHER): Payer: Medicare Other | Admitting: Cardiology

## 2019-11-18 VITALS — BP 179/76 | HR 95 | Temp 97.2°F | Resp 14 | Ht 61.0 in | Wt 211.0 lb

## 2019-11-18 DIAGNOSIS — I351 Nonrheumatic aortic (valve) insufficiency: Secondary | ICD-10-CM | POA: Diagnosis not present

## 2019-11-18 DIAGNOSIS — E785 Hyperlipidemia, unspecified: Secondary | ICD-10-CM | POA: Diagnosis not present

## 2019-11-18 DIAGNOSIS — I10 Essential (primary) hypertension: Secondary | ICD-10-CM | POA: Diagnosis not present

## 2019-11-18 DIAGNOSIS — I6522 Occlusion and stenosis of left carotid artery: Secondary | ICD-10-CM

## 2019-11-18 MED ORDER — SPIRONOLACTONE 25 MG PO TABS: 25.0000 mg | ORAL_TABLET | Freq: Every day | ORAL | 2 refills | Status: DC

## 2019-11-18 MED ORDER — ATORVASTATIN CALCIUM 10 MG PO TABS: 10.0000 mg | ORAL_TABLET | Freq: Every day | ORAL | 3 refills | Status: DC

## 2019-11-18 NOTE — Progress Notes (Signed)
Primary Physician/Referring:  Celene Squibb, MD  Patient ID: Sarah Cooper, female    DOB: 1951-06-20, 69 y.o.   MRN: [id number]  No chief complaint on file.  HPI:    Sarah Cooper  is a 69 y.o. African American female with hypertension, mild asymptomatic left carotid stenosis, mild hyperlipidemia, prediabetes mellitus, insomnia, bilateral mild leg edema secondary to varicose veins, degenerative arthritis, elevated BMI (BMI 39) previously evaluated for dyspnea on exertion, has had a low risk stress test in 2020.  She now presents for follow-up of hypertension and also hyperlipidemia.   She is presently doing well and has noticed occasional episodes of leg edema but no further palpitations or chest pain.  He is also lost about 4 pounds in weight.  Past Medical History:  Diagnosis Date  . Anxiety   . Arthritis   . Cancer ([place]) 1978   cervical  . Depression   . Hypertension    Past Surgical History:  Procedure Laterality Date  . BREAST BIOPSY Left    approx @ 48 benign   . HIP SURGERY  2007   right  . JOINT REPLACEMENT  2007   Social History   Socioeconomic History  . Marital status: Single    Spouse name: Not on file  . Number of children: 4  . Years of education: Not on file  . Highest education level: Not on file  Occupational History  . Not on file  Tobacco Use  . Smoking status: Former Smoker    Packs/day: 1.00    Years: 50.00    Pack years: 50.00    Types: Cigarettes    Start date: 1966    Quit date: 10/14/2010    Years since quitting: 9.1  . Smokeless tobacco: Never Used  Substance and Sexual Activity  . Alcohol use: Yes    Alcohol/week: 3.0 standard drinks    Types: 3 Glasses of wine per week  . Drug use: No  . Sexual activity: Never  Other Topics Concern  . Not on file  Social History Narrative  . Not on file   Social Determinants of Health   Financial Resource Strain:   . Difficulty of Paying Living Expenses: Not on file  Food  Insecurity:   . Worried About Charity fundraiser in the Last Year: Not on file  . Ran Out of Food in the Last Year: Not on file  Transportation Needs:   . Lack of Transportation (Medical): Not on file  . Lack of Transportation (Non-Medical): Not on file  Physical Activity:   . Days of Exercise per Week: Not on file  . Minutes of Exercise per Session: Not on file  Stress:   . Feeling of Stress : Not on file  Social Connections:   . Frequency of Communication with Friends and Family: Not on file  . Frequency of Social Gatherings with Friends and Family: Not on file  . Attends Religious Services: Not on file  . Active Member of Clubs or Organizations: Not on file  . Attends Archivist Meetings: Not on file  . Marital Status: Not on file  Intimate Partner Violence:   . Fear of Current or Ex-Partner: Not on file  . Emotionally Abused: Not on file  . Physically Abused: Not on file  . Sexually Abused: Not on file   ROS  Review of Systems  Constitution: Positive for weight loss (intentional). Negative for malaise/fatigue.  HENT: Negative for congestion.   Eyes:  Negative for blurred vision.  Cardiovascular: Positive for leg swelling (occasional). Negative for chest pain, claudication, cyanosis, irregular heartbeat, near-syncope, paroxysmal nocturnal dyspnea and syncope.  Respiratory: Positive for shortness of breath and snoring. Negative for cough and sputum production.   Skin: Negative for flushing.  Musculoskeletal: Positive for arthritis (DJD and severe pain right hip).  Gastrointestinal: Negative for abdominal pain and change in bowel habit.   Objective   Vitals with BMI 09/16/2019 08/06/2019 04/22/2019  Height 5\' 1"  5\' 2"  5\' 2"   Weight 214 lbs 5 oz 215 lbs 214 lbs  BMI 40.51 [id number]  Systolic [id number]  Diastolic 81 84 96  Pulse 98 93 98    Last menstrual period 10/15/2007. There is no height or weight on file to calculate BMI. Recheck after sitting down for  >15 minutes was 160/82.   Physical Exam  Constitutional: She is oriented to person, place, and time.  Morbidly obese in no acute distress  Eyes: Conjunctivae and EOM are normal. No scleral icterus.  Cardiovascular: Normal rate. An irregular rhythm present.  Murmur heard.  Medium-pitched midsystolic murmur is present with a grade of 2/6 at the upper right sternal border radiating to the neck. Pulses:      Carotid pulses are on the right side with bruit and on the left side with bruit.      Radial pulses are 2+ on the right side and 2+ on the left side.       Popliteal pulses are 2+ on the right side and 2+ on the left side.       Dorsalis pedis pulses are 2+ on the right side and 2+ on the left side.       Posterior tibial pulses are 0 on the right side and 0 on the left side.  No leg edema. No JVD.   Pulmonary/Chest: Effort normal and breath sounds normal. She has no wheezes.  Abdominal: Soft. There is no abdominal tenderness.  Musculoskeletal:        General: Normal range of motion.     Cervical back: Neck supple.  Neurological: She is alert and oriented to person, place, and time.  Skin: Skin is warm and dry.  Psychiatric: She has a normal mood and affect.   Laboratory examination:   No results for input(s): NA, K, CL, CO2, GLUCOSE, BUN, CREATININE, CALCIUM, GFRNONAA, GFRAA in the last 8760 hours. CMP Latest Ref Rng & Units 06/17/2017 11/19/2016  Glucose 65 - 99 mg/dL 121(H) 111(H)  BUN 7 - 25 mg/dL 11 19  Creatinine 0.50 - 0.99 mg/dL 0.61 0.82  Sodium 135 - 146 mmol/L 139 140  Potassium 3.5 - 5.3 mmol/L 4.3 3.9  Chloride 98 - 110 mmol/L 102 104  CO2 20 - 32 mmol/L 29 28  Calcium 8.6 - 10.4 mg/dL 9.6 9.1  Total Protein 6.1 - 8.1 g/dL - 7.0  Total Bilirubin 0.2 - 1.2 mg/dL - 0.2  Alkaline Phos 33 - 130 U/L - 58  AST 10 - 35 U/L - 19  ALT 6 - 29 U/L - 17   CBC Latest Ref Rng & Units 11/19/2016  WBC 3.8 - 10.8 K/uL 6.5  Hemoglobin 11.7 - 15.5 g/dL 10.8(L)  Hematocrit 35.0 -  45.0 % 34.8(L)  Platelets 140 - 400 K/uL 281   Lipid Panel  Triglycerides 104, HDL 90, LDL 98 in 07/2019 Lipid Panel     Component Value Date/Time   CHOL 160 11/12/2019 0816   TRIG 118 11/12/2019 0816  HDL 79 11/12/2019 0816   CHOLHDL 2.3 11/19/2016 1530   VLDL 14 11/19/2016 1530   LDLCALC 61 11/12/2019 0816   LABVLDL 20 11/12/2019 0816     HEMOGLOBIN A1C  A1c 5.9% in 07/2019  TSH No results for input(s): TSH in the last 8760 hours. Medications and allergies   Allergies  Allergen Reactions  . Aspirin     Current Outpatient Medications  Medication Instructions  . amLODipine (NORVASC) 10 MG tablet TAKE 1 TABLET BY MOUTH EVERY DAY  . atorvastatin (LIPITOR) 10 MG tablet TAKE 1 TABLET BY MOUTH EVERY DAY  . Blood Pressure Monitor MISC Disp. 1 b/p monitor.  . Cholecalciferol (VITAMIN D3 SUPER STRENGTH) 50 MCG (2000 UT) CAPS Oral, Daily  . gabapentin (NEURONTIN) 400 mg, Oral, 3 times daily  . ibuprofen (ADVIL) 800 mg, Oral, 3 times daily PRN  . lisinopril-hydrochlorothiazide (PRINZIDE,ZESTORETIC) 20-25 MG tablet 1 tablet, Oral, Daily  . Multiple Vitamins-Minerals (MULTIVITAMIN WOMEN PO) Oral, Daily  . QUEtiapine (SEROQUEL) 50 mg, Oral, At bedtime PRN  . traMADol (ULTRAM) 50 mg, Oral, Every 6 hours PRN   Radiology:   Cardiac Studies:   Lexiscan Tetrofosmin stress test 09/06/2019: No previous exam available for comparison. Lexiscan nuclear stress test performed using 1-day protocol.  SPECT stress and rest images demonstrate normal myocardial perfusion. Septal dyskinesis with LVEF 30%. High risk study due to reduced LVEF. Recommend clinical correlation.   Echocardiogram 08/19/2019: Left ventricle cavity is normal in size. Mild concentric hypertrophy of the left ventricle. Normal LV systolic function with visual EF 55-60%. Normal global wall motion. Doppler evidence of grade I (impaired) diastolic dysfunction, normal LAP.  Trileaflet aortic valve. Mild to moderate aortic  regurgitation. Mild (Grade I) mitral regurgitation. Normal right atrial pressure.   Carotid artery duplex  [id number]: Peak systolic velocities in the right bifurcation, internal, external and common carotid arteries are within normal limits. Left carotid stenosis in the range of 16-49% with moderate heterogeneous plaque. Antegrade right vertebral artery flow. Antegrade left vertebral artery flow. Follow up in one year is appropriate if clinically indicated.  Assessment     ICD-10-CM   1. Mild hyperlipidemia  E78.5   2. Stenosis of left carotid artery  I65.22   3. Moderate aortic regurgitation  I35.1     EKG 08/06/2019: Sinus rhythm with occasional ectopic beats.  Normal axis.  Recommendations:   Sarah Cooper  is a 69 y.o. African American female with hypertension, mild asymptomatic left carotid stenosis, mild hyperlipidemia, prediabetes mellitus, insomnia, bilateral mild leg edema secondary to varicose veins, degenerative arthritis, elevated BMI (BMI 39) previously evaluated for dyspnea on exertion, has had a low risk stress test in 2020.  She now presents for follow-up of hypertension and also hyperlipidemia.  Today her blood pressure is elevated, suspect related to left hip pain which needs replacement.  However she has also noticed blood pressure to be elevated at home, will add Aldactone 20 mg daily, BMP in 1 week to 2 weeks.  Now tolerating Lipitor with excellent control of lipids.  Refill the prescription.  Otherwise remained stable from cardiac standpoint,  I'll see her back on an annual basis for management of aortic regurgitation and also carotid stenosis.   Adrian Prows, MD, [facility] 11/18/2019, 10:02 AM [facility]. [place] Office: [phone]

## 2019-11-20 ENCOUNTER — Ambulatory Visit: Payer: Medicare Other | Attending: Internal Medicine

## 2019-11-20 ENCOUNTER — Other Ambulatory Visit: Payer: Self-pay | Admitting: Cardiology

## 2019-11-20 DIAGNOSIS — I10 Essential (primary) hypertension: Secondary | ICD-10-CM

## 2019-11-20 DIAGNOSIS — Z23 Encounter for immunization: Secondary | ICD-10-CM | POA: Insufficient documentation

## 2019-11-20 NOTE — Progress Notes (Signed)
   Covid-19 Vaccination Clinic  Name:  Sarah Cooper    MRN: [id number] DOB: 01-12-1951  11/20/2019  [name] was observed post Covid-19 immunization for 15 minutes without incidence. She was provided with Vaccine Information Sheet and instruction to access the V-Safe system.   [name] was instructed to call 911 with any severe reactions post vaccine: Marland Kitchen Difficulty breathing  . Swelling of your face and throat  . A fast heartbeat  . A bad rash all over your body  . Dizziness and weakness    Immunizations Administered    Name Date Dose VIS Date Route   Moderna COVID-19 Vaccine 11/20/2019 12:34 PM 0.5 mL 09/14/2019 Intramuscular   Manufacturer: Moderna   Lot: [id number]   [id number]

## 2019-12-08 ENCOUNTER — Other Ambulatory Visit: Payer: Self-pay | Admitting: Cardiology

## 2019-12-08 DIAGNOSIS — M62838 Other muscle spasm: Secondary | ICD-10-CM | POA: Diagnosis not present

## 2019-12-08 DIAGNOSIS — M169 Osteoarthritis of hip, unspecified: Secondary | ICD-10-CM | POA: Diagnosis not present

## 2019-12-08 DIAGNOSIS — M79641 Pain in right hand: Secondary | ICD-10-CM | POA: Diagnosis not present

## 2019-12-08 DIAGNOSIS — I10 Essential (primary) hypertension: Secondary | ICD-10-CM | POA: Diagnosis not present

## 2019-12-09 LAB — BASIC METABOLIC PANEL
BUN/Creatinine Ratio: 23 (ref 12–28)
BUN: 22 mg/dL (ref 8–27)
CO2: 21 mmol/L (ref 20–29)
Calcium: 10.4 mg/dL — ABNORMAL HIGH (ref 8.7–10.3)
Chloride: 99 mmol/L (ref 96–106)
Creatinine, Ser: 0.95 mg/dL (ref 0.57–1.00)
GFR calc Af Amer: 70 mL/min/{1.73_m2} (ref >59)
GFR calc non Af Amer: 61 mL/min/{1.73_m2} (ref >59)
Glucose: 119 mg/dL — ABNORMAL HIGH (ref 65–99)
Potassium: 4.6 mmol/L (ref 3.5–5.2)
Sodium: 139 mmol/L (ref 134–144)

## 2019-12-09 LAB — SPECIMEN STATUS REPORT

## 2019-12-10 NOTE — Progress Notes (Signed)
Stable renal function

## 2019-12-21 ENCOUNTER — Ambulatory Visit: Payer: Medicare Other | Attending: Internal Medicine

## 2019-12-21 DIAGNOSIS — Z23 Encounter for immunization: Secondary | ICD-10-CM | POA: Insufficient documentation

## 2019-12-21 NOTE — Progress Notes (Signed)
   Covid-19 Vaccination Clinic  Name:  Sarah Cooper    MRN: [id number] DOB: 12-01-1950  12/21/2019  [name] was observed post Covid-19 immunization for 15 minutes without incident. She was provided with Vaccine Information Sheet and instruction to access the V-Safe system.   Ms. Demo was instructed to call 911 with any severe reactions post vaccine: Marland Kitchen Difficulty breathing  . Swelling of face and throat  . A fast heartbeat  . A bad rash all over body  . Dizziness and weakness   Immunizations Administered    Name Date Dose VIS Date Route   Moderna COVID-19 Vaccine 12/21/2019 10:41 AM 0.5 mL 09/14/2019 Intramuscular   Manufacturer: Moderna   Lot: [id number]   [place]:[id number]

## 2020-01-24 ENCOUNTER — Encounter: Payer: Self-pay | Admitting: Cardiology

## 2020-01-27 DIAGNOSIS — E039 Hypothyroidism, unspecified: Secondary | ICD-10-CM | POA: Diagnosis not present

## 2020-01-27 DIAGNOSIS — R2689 Other abnormalities of gait and mobility: Secondary | ICD-10-CM | POA: Diagnosis not present

## 2020-01-27 DIAGNOSIS — M545 Low back pain: Secondary | ICD-10-CM | POA: Diagnosis not present

## 2020-01-27 DIAGNOSIS — N390 Urinary tract infection, site not specified: Secondary | ICD-10-CM | POA: Diagnosis not present

## 2020-01-27 DIAGNOSIS — R7301 Impaired fasting glucose: Secondary | ICD-10-CM | POA: Diagnosis not present

## 2020-01-27 DIAGNOSIS — I10 Essential (primary) hypertension: Secondary | ICD-10-CM | POA: Diagnosis not present

## 2020-01-31 DIAGNOSIS — I10 Essential (primary) hypertension: Secondary | ICD-10-CM | POA: Diagnosis not present

## 2020-01-31 DIAGNOSIS — M19041 Primary osteoarthritis, right hand: Secondary | ICD-10-CM | POA: Diagnosis not present

## 2020-01-31 DIAGNOSIS — R7303 Prediabetes: Secondary | ICD-10-CM | POA: Diagnosis not present

## 2020-01-31 DIAGNOSIS — G4700 Insomnia, unspecified: Secondary | ICD-10-CM | POA: Diagnosis not present

## 2020-01-31 DIAGNOSIS — R0602 Shortness of breath: Secondary | ICD-10-CM | POA: Diagnosis not present

## 2020-02-04 ENCOUNTER — Other Ambulatory Visit: Payer: Self-pay | Admitting: Cardiology

## 2020-02-04 DIAGNOSIS — I10 Essential (primary) hypertension: Secondary | ICD-10-CM

## 2020-02-29 ENCOUNTER — Other Ambulatory Visit: Payer: Self-pay | Admitting: Cardiology

## 2020-02-29 DIAGNOSIS — I10 Essential (primary) hypertension: Secondary | ICD-10-CM

## 2020-03-01 ENCOUNTER — Other Ambulatory Visit: Payer: Self-pay | Admitting: Cardiology

## 2020-03-01 DIAGNOSIS — I10 Essential (primary) hypertension: Secondary | ICD-10-CM

## 2020-03-28 DIAGNOSIS — R0602 Shortness of breath: Secondary | ICD-10-CM | POA: Diagnosis not present

## 2020-03-30 ENCOUNTER — Ambulatory Visit: Payer: Medicare Other | Admitting: Cardiology

## 2020-03-30 ENCOUNTER — Encounter: Payer: Self-pay | Admitting: Cardiology

## 2020-03-30 ENCOUNTER — Other Ambulatory Visit: Payer: Self-pay

## 2020-03-30 VITALS — BP 133/68 | HR 97 | Resp 15 | Ht 61.0 in | Wt 210.2 lb

## 2020-03-30 DIAGNOSIS — I5033 Acute on chronic diastolic (congestive) heart failure: Secondary | ICD-10-CM | POA: Diagnosis not present

## 2020-03-30 DIAGNOSIS — E785 Hyperlipidemia, unspecified: Secondary | ICD-10-CM

## 2020-03-30 DIAGNOSIS — R0602 Shortness of breath: Secondary | ICD-10-CM

## 2020-03-30 DIAGNOSIS — I6522 Occlusion and stenosis of left carotid artery: Secondary | ICD-10-CM

## 2020-03-30 DIAGNOSIS — I351 Nonrheumatic aortic (valve) insufficiency: Secondary | ICD-10-CM

## 2020-03-30 DIAGNOSIS — I10 Essential (primary) hypertension: Secondary | ICD-10-CM | POA: Diagnosis not present

## 2020-03-30 MED ORDER — POTASSIUM CHLORIDE CRYS ER 20 MEQ PO TBCR: 20.0000 meq | EXTENDED_RELEASE_TABLET | Freq: Every day | ORAL | 1 refills | Status: DC | PRN

## 2020-03-30 MED ORDER — FUROSEMIDE 40 MG PO TABS: 40.0000 mg | ORAL_TABLET | Freq: Every day | ORAL | 2 refills | Status: DC | PRN

## 2020-03-30 NOTE — Patient Instructions (Addendum)
You will get labs done today and again in 2 weeks.  Dr. Einar Gip she is she is she is she is on

## 2020-03-30 NOTE — Progress Notes (Signed)
Primary Physician/Referring:  Celene Squibb, MD  Patient ID: Sarah Cooper, female    DOB: 05-Jan-1951, 69 y.o.   MRN: [id number]  Chief Complaint  Patient presents with  . Follow-up    STAT  . Shortness of Breath   HPI:    Sarah Cooper  is a 69 y.o. African American female with hypertension, mild asymptomatic left carotid stenosis, mild hyperlipidemia, prediabetes mellitus, insomnia, bilateral mild leg edema secondary to varicose veins, degenerative arthritis, elevated BMI (BMI 39) previously evaluated for dyspnea on exertion, has had a low risk stress test in 2020, low risk.  Over the past week she has noticed marked dyspnea on exertion, mild orthopnea and also had ankle edema.  No associated pain but occasionally has mild chest discomfort with or without exertional activity that lasts a few seconds.  Her main complaint being dyspnea.  No PND or orthopnea.  Past Medical History:  Diagnosis Date  . Anxiety   . Arthritis   . Cancer ([place]) 1978   cervical  . Depression   . Hypertension    Past Surgical History:  Procedure Laterality Date  . BREAST BIOPSY Left    approx @ 48 benign   . HIP SURGERY  2007   right  . JOINT REPLACEMENT  2007   Social History   Socioeconomic History  . Marital status: Single    Spouse name: Not on file  . Number of children: 4  . Years of education: Not on file  . Highest education level: Not on file  Occupational History  . Not on file  Tobacco Use  . Smoking status: Former Smoker    Packs/day: 1.00    Years: 50.00    Pack years: 50.00    Types: Cigarettes    Start date: 1966    Quit date: 10/14/2010    Years since quitting: 9.4  . Smokeless tobacco: Never Used  Vaping Use  . Vaping Use: Never used  Substance and Sexual Activity  . Alcohol use: Yes    Alcohol/week: 3.0 standard drinks    Types: 3 Glasses of wine per week  . Drug use: No  . Sexual activity: Never  Other Topics Concern  . Not on file  Social  History Narrative  . Not on file   Social Determinants of Health   Financial Resource Strain:   . Difficulty of Paying Living Expenses:   Food Insecurity:   . Worried About Charity fundraiser in the Last Year:   . Arboriculturist in the Last Year:   Transportation Needs:   . Film/video editor (Medical):   Marland Kitchen Lack of Transportation (Non-Medical):   Physical Activity:   . Days of Exercise per Week:   . Minutes of Exercise per Session:   Stress:   . Feeling of Stress :   Social Connections:   . Frequency of Communication with Friends and Family:   . Frequency of Social Gatherings with Friends and Family:   . Attends Religious Services:   . Active Member of Clubs or Organizations:   . Attends Archivist Meetings:   Marland Kitchen Marital Status:   Intimate Partner Violence:   . Fear of Current or Ex-Partner:   . Emotionally Abused:   Marland Kitchen Physically Abused:   . Sexually Abused:    ROS  Review of Systems  Constitutional: Negative for malaise/fatigue.  Eyes: Negative for blurred vision.  Cardiovascular: Positive for leg swelling (occasional). Negative for chest pain,  paroxysmal nocturnal dyspnea and syncope.  Respiratory: Positive for shortness of breath and snoring. Negative for cough and sputum production.   Musculoskeletal: Positive for arthritis (DJD and severe pain right hip).   Objective   Vitals with BMI 03/30/2020 11/18/2019 09/16/2019  Height 5\' 1"  5\' 1"  5\' 1"   Weight 210 lbs 3 oz 211 lbs 214 lbs 5 oz  BMI 39.74 37.16 96.78  Systolic 938 101 751  Diastolic 68 76 81  Pulse 97 95 98    Blood pressure 133/68, pulse 97, resp. rate 15, height 5\' 1"  (1.549 m), weight 210 lb 3.2 oz (95.3 kg), last menstrual period 10/15/2007, SpO2 98 %. Body mass index is 39.72 kg/m. Recheck after sitting down for >15 minutes was 160/82.   Physical Exam Constitutional:      Comments: Morbidly obese in no acute distress  Eyes:     General: No scleral icterus.    Conjunctiva/sclera:  Conjunctivae normal.  Cardiovascular:     Rate and Rhythm: Normal rate. Rhythm irregular.     Pulses:          Carotid pulses are on the right side with bruit and on the left side with bruit.      Radial pulses are 2+ on the right side and 2+ on the left side.       Popliteal pulses are 2+ on the right side and 2+ on the left side.       Dorsalis pedis pulses are 2+ on the right side and 2+ on the left side.       Posterior tibial pulses are 0 on the right side and 0 on the left side.     Heart sounds: Murmur heard.  Medium-pitched midsystolic murmur is present with a grade of 2/6 at the upper right sternal border radiating to the neck.      Comments: No leg edema. No JVD.  Pulmonary:     Effort: Pulmonary effort is normal.     Breath sounds: Normal breath sounds. No wheezing.  Abdominal:     Palpations: Abdomen is soft.     Tenderness: There is no abdominal tenderness.  Musculoskeletal:        General: Normal range of motion.     Cervical back: Neck supple.  Skin:    General: Skin is warm and dry.  Neurological:     Mental Status: She is alert and oriented to person, place, and time.    Laboratory examination:   Recent Labs    12/08/19 1043  NA 139  K 4.6  CL 99  CO2 21  GLUCOSE 119*  BUN 22  CREATININE 0.95  CALCIUM 10.4*  GFRNONAA 61  GFRAA 70   CMP Latest Ref Rng & Units 12/08/2019 06/17/2017 11/19/2016  Glucose 65 - 99 mg/dL 119(H) 121(H) 111(H)  BUN 8 - 27 mg/dL 22 11 19   Creatinine 0.57 - 1.00 mg/dL 0.95 0.61 0.82  Sodium 134 - 144 mmol/L 139 139 140  Potassium 3.5 - 5.2 mmol/L 4.6 4.3 3.9  Chloride 96 - 106 mmol/L 99 102 104  CO2 20 - 29 mmol/L 21 29 28   Calcium 8.7 - 10.3 mg/dL 10.4(H) 9.6 9.1  Total Protein 6.1 - 8.1 g/dL - - 7.0  Total Bilirubin 0.2 - 1.2 mg/dL - - 0.2  Alkaline Phos 33 - 130 U/L - - 58  AST 10 - 35 U/L - - 19  ALT 6 - 29 U/L - - 17   CBC Latest Ref Rng &  Units 11/19/2016  WBC 3.8 - 10.8 K/uL 6.5  Hemoglobin 11.7 - 15.5 g/dL 10.8(L)    Hematocrit 35 - 45 % 34.8(L)  Platelets 140 - 400 K/uL 281   Lipid Panel  Triglycerides 104, HDL 90, LDL 98 in 07/2019 Lipid Panel     Component Value Date/Time   CHOL 160 11/12/2019 0816   TRIG 118 11/12/2019 0816   HDL 79 11/12/2019 0816   CHOLHDL 2.3 11/19/2016 1530   VLDL 14 11/19/2016 1530   LDLCALC 61 11/12/2019 0816   LABVLDL 20 11/12/2019 0816     HEMOGLOBIN A1C  A1c 5.9% in 07/2019  TSH No results for input(s): TSH in the last 8760 hours. Medications and allergies   Allergies  Allergen Reactions  . Aspirin     Current Outpatient Medications  Medication Instructions  . amLODipine (NORVASC) 10 MG tablet TAKE 1 TABLET BY MOUTH EVERY DAY  . atorvastatin (LIPITOR) 10 mg, Oral, Daily  . Blood Pressure Monitor MISC Disp. 1 b/p monitor.  . Cholecalciferol (VITAMIN D3 SUPER STRENGTH) 50 MCG (2000 UT) CAPS Oral, Daily  . furosemide (LASIX) 40 mg, Oral, Daily PRN  . gabapentin (NEURONTIN) 400 mg, Oral, 3 times daily  . ibuprofen (ADVIL) 800 mg, Oral, 3 times daily PRN  . lisinopril-hydrochlorothiazide (PRINZIDE,ZESTORETIC) 20-25 MG tablet 1 tablet, Oral, Daily  . methocarbamol (ROBAXIN) 750 mg, Oral, 3 times daily  . Multiple Vitamins-Minerals (MULTIVITAMIN WOMEN PO) Oral, Daily  . potassium chloride SA (KLOR-CON) 20 MEQ tablet 20 mEq, Oral, Daily PRN  . QUEtiapine (SEROQUEL) 50 mg, Oral, At bedtime PRN  . spironolactone (ALDACTONE) 25 MG tablet TAKE 1 TABLET BY MOUTH EVERY DAY  . traMADol (ULTRAM) 50 mg, Oral, Every 6 hours PRN   Radiology:   Cardiac Studies:   Lexiscan Tetrofosmin stress test 09/06/2019: No previous exam available for comparison. Lexiscan nuclear stress test performed using 1-day protocol.  SPECT stress and rest images demonstrate normal myocardial perfusion. Septal dyskinesis with LVEF 30%. High risk study due to reduced LVEF. Recommend clinical correlation.   Echocardiogram 08/19/2019: Left ventricle cavity is normal in size. Mild concentric  hypertrophy of the left ventricle. Normal LV systolic function with visual EF 55-60%. Normal global wall motion. Doppler evidence of grade I (impaired) diastolic dysfunction, normal LAP.  Trileaflet aortic valve. Mild to moderate aortic regurgitation. Mild (Grade I) mitral regurgitation. Normal right atrial pressure.   Carotid artery duplex  16/07/9603: Peak systolic velocities in the right bifurcation, internal, external and common carotid arteries are within normal limits. Left carotid stenosis in the range of 16-49% with moderate heterogeneous plaque. Antegrade right vertebral artery flow. Antegrade left vertebral artery flow. Follow up in one year is appropriate if clinically indicated.  EKG:  EKG 03/30/2020: Normal sinus rhythm at rate of 89 bpm, normal axis, poor R wave progression, cannot exclude anteroseptal infarct old.  No evidence of ischemia.  Normal QT interval.   ABNORMAL.  No significant change from 08/06/2019.  Assessment     ICD-10-CM   1. Shortness of breath  R06.02 EKG 12-Lead  2. Acute on chronic diastolic heart failure (HCC)  V40.98 Basic metabolic panel    Brain natriuretic peptide    furosemide (LASIX) 40 MG tablet    potassium chloride SA (KLOR-CON) 20 MEQ tablet    PCV ECHOCARDIOGRAM COMPLETE    Basic metabolic panel    Brain natriuretic peptide  3. Essential hypertension  I10   4. Moderate aortic regurgitation  I35.1 PCV ECHOCARDIOGRAM COMPLETE  5. Stenosis of  left carotid artery  I65.22   6. Mild hyperlipidemia  E78.5      Recommendations:   Jleigh Striplin  is a 69 y.o. African American female with hypertension, mild asymptomatic left carotid stenosis, mild hyperlipidemia, prediabetes mellitus, insomnia, bilateral mild leg edema secondary to varicose veins, degenerative arthritis, elevated BMI (BMI 39) previously evaluated for dyspnea on exertion, has had a low risk stress test in 2020.    She has developed acute onset of dyspnea on exertion over  the past 2 weeks.  Suspect acute on chronic diastolic heart failure.  I will start her on furosemide 20 mg p.o. every morning, she is advised to take 40 mg if she has no response with diuresis and also will take potassium supplements on a as needed basis depending upon Lasix use.  She will obtain BMP and BNP today and again repeat the test in 2 weeks and I would like to see her back in 3 weeks.  She has moderate aortic regurgitation.  I like to repeat echocardiogram in view of acute decompensated heart failure.  With regard to hypertension, blood pressure is well controlled, she is on a statin.  She is tolerating this and she has been very compliant.  Await further recommendations as to follow along. Adrian Prows, MD, [facility] 03/30/2020, 10:17 AM [facility]. [place] Office: [phone]

## 2020-03-31 LAB — BASIC METABOLIC PANEL
BUN/Creatinine Ratio: 23 (ref 12–28)
BUN: 19 mg/dL (ref 8–27)
CO2: 22 mmol/L (ref 20–29)
Calcium: 10.1 mg/dL (ref 8.7–10.3)
Chloride: 103 mmol/L (ref 96–106)
Creatinine, Ser: 0.82 mg/dL (ref 0.57–1.00)
GFR calc Af Amer: 84 mL/min/{1.73_m2} (ref >59)
GFR calc non Af Amer: 73 mL/min/{1.73_m2} (ref >59)
Glucose: 103 mg/dL — ABNORMAL HIGH (ref 65–99)
Potassium: 4.5 mmol/L (ref 3.5–5.2)
Sodium: 139 mmol/L (ref 134–144)

## 2020-03-31 LAB — BRAIN NATRIURETIC PEPTIDE: BNP: 24.7 pg/mL (ref 0.0–100.0)

## 2020-04-02 NOTE — Progress Notes (Signed)
Normal BMP

## 2020-04-06 ENCOUNTER — Other Ambulatory Visit: Payer: Self-pay

## 2020-04-06 ENCOUNTER — Ambulatory Visit: Payer: Medicare Other

## 2020-04-06 DIAGNOSIS — I351 Nonrheumatic aortic (valve) insufficiency: Secondary | ICD-10-CM

## 2020-04-06 DIAGNOSIS — I5033 Acute on chronic diastolic (congestive) heart failure: Secondary | ICD-10-CM

## 2020-04-13 ENCOUNTER — Telehealth: Payer: Self-pay

## 2020-04-13 ENCOUNTER — Other Ambulatory Visit: Payer: Self-pay

## 2020-04-13 DIAGNOSIS — I5033 Acute on chronic diastolic (congestive) heart failure: Secondary | ICD-10-CM

## 2020-04-13 NOTE — Telephone Encounter (Signed)
Labs BNP and BMP were to be order per last OV note but no order was in. I put labs in per [facility] and release them. Patient was at [place] waiting.

## 2020-04-14 LAB — BASIC METABOLIC PANEL
BUN/Creatinine Ratio: 28 (ref 12–28)
BUN: 42 mg/dL — ABNORMAL HIGH (ref 8–27)
CO2: 23 mmol/L (ref 20–29)
Calcium: 10.6 mg/dL — ABNORMAL HIGH (ref 8.7–10.3)
Chloride: 95 mmol/L — ABNORMAL LOW (ref 96–106)
Creatinine, Ser: 1.5 mg/dL — ABNORMAL HIGH (ref 0.57–1.00)
GFR calc Af Amer: 40 mL/min/{1.73_m2} — ABNORMAL LOW (ref >59)
GFR calc non Af Amer: 35 mL/min/{1.73_m2} — ABNORMAL LOW (ref >59)
Glucose: 117 mg/dL — ABNORMAL HIGH (ref 65–99)
Potassium: 5.2 mmol/L (ref 3.5–5.2)
Sodium: 136 mmol/L (ref 134–144)

## 2020-04-14 LAB — PRO B NATRIURETIC PEPTIDE: NT-Pro BNP: 36 pg/mL (ref 0–301)

## 2020-04-15 ENCOUNTER — Other Ambulatory Visit: Payer: Self-pay | Admitting: Cardiology

## 2020-04-15 DIAGNOSIS — I10 Essential (primary) hypertension: Secondary | ICD-10-CM

## 2020-04-15 NOTE — Progress Notes (Signed)
Result came to me. Cr has increased.

## 2020-04-23 ENCOUNTER — Other Ambulatory Visit: Payer: Self-pay | Admitting: Cardiology

## 2020-04-23 DIAGNOSIS — I5033 Acute on chronic diastolic (congestive) heart failure: Secondary | ICD-10-CM

## 2020-04-26 ENCOUNTER — Other Ambulatory Visit: Payer: Self-pay

## 2020-04-26 ENCOUNTER — Ambulatory Visit: Payer: Medicare Other | Admitting: Cardiology

## 2020-04-26 VITALS — BP 140/77 | HR 82 | Ht 61.0 in | Wt 203.8 lb

## 2020-04-26 DIAGNOSIS — I10 Essential (primary) hypertension: Secondary | ICD-10-CM | POA: Diagnosis not present

## 2020-04-26 DIAGNOSIS — R0602 Shortness of breath: Secondary | ICD-10-CM | POA: Diagnosis not present

## 2020-04-26 DIAGNOSIS — I6522 Occlusion and stenosis of left carotid artery: Secondary | ICD-10-CM | POA: Diagnosis not present

## 2020-04-26 DIAGNOSIS — I5032 Chronic diastolic (congestive) heart failure: Secondary | ICD-10-CM

## 2020-04-26 DIAGNOSIS — N179 Acute kidney failure, unspecified: Secondary | ICD-10-CM | POA: Diagnosis not present

## 2020-04-26 DIAGNOSIS — I209 Angina pectoris, unspecified: Secondary | ICD-10-CM

## 2020-04-26 NOTE — Progress Notes (Signed)
Primary Physician/Referring:  Celene Squibb, MD  Patient ID: Sarah Cooper, female    DOB: [birth year], 69 y.o.   MRN: [id number]  Chief Complaint  Patient presents with  . Congestive Heart Failure  . Hypertension   HPI:    Sarah Cooper  is a 69 y.o. African American female with hypertension, mild asymptomatic left carotid stenosis, mild hyperlipidemia, prediabetes mellitus, insomnia, bilateral mild leg edema secondary to varicose veins, degenerative arthritis, elevated BMI (BMI 39) previously evaluated for dyspnea on exertion, has had a low risk stress test in 2020, low risk.  I had seen her on 03/30/2020 for acute worsening dyspnea and orthopnea and leg edema.  I had started her on furosemide and obtain labs and she now presents for follow-up.  She has noticed improvement in shortness of breath but still continues to have dyspnea class III even doing routine activities at home.  Accompanied by her daughter.  She is also started noticing chest tightness in the middle of the chest with exertional activities lasting 15 to 20 minutes.  States that she has reduced her activity significantly.  She has not had any further PND or orthopnea.  Past Medical History:  Diagnosis Date  . Anxiety   . Arthritis   . Cancer ([place]) 1978   cervical  . Depression   . Hypertension    Past Surgical History:  Procedure Laterality Date  . BREAST BIOPSY Left    approx @ 48 benign   . HIP SURGERY  2007   right  . JOINT REPLACEMENT  2007   Social History   Tobacco Use  . Smoking status: Former Smoker    Packs/day: 1.00    Years: 50.00    Pack years: 50.00    Types: Cigarettes    Start date: 1966    Quit date: 10/14/2010    Years since quitting: 9.5  . Smokeless tobacco: Never Used  Substance Use Topics  . Alcohol use: Yes    Alcohol/week: 3.0 standard drinks    Types: 3 Glasses of wine per week   Marital status: Single   ROS  Review of Systems  Constitutional: Negative for  malaise/fatigue.  Eyes: Negative for blurred vision.  Cardiovascular: Positive for chest pain, dyspnea on exertion and leg swelling (occasional). Negative for paroxysmal nocturnal dyspnea and syncope.  Respiratory: Positive for snoring. Negative for cough and sputum production.   Musculoskeletal: Positive for arthritis (DJD and severe pain right hip).   Objective   Vitals with BMI 04/26/2020 03/30/2020 11/18/2019  Height 5\' 1"  5\' 1"  5\' 1"   Weight 203 lbs 13 oz 210 lbs 3 oz 211 lbs  BMI 38.53 34.19 37.90  Systolic 240 973 532  Diastolic 77 68 76  Pulse 82 97 95    Physical Exam Constitutional:      Comments: Morbidly obese in no acute distress  Eyes:     General: No scleral icterus.    Conjunctiva/sclera: Conjunctivae normal.  Cardiovascular:     Rate and Rhythm: Normal rate. Rhythm irregular.     Pulses:          Carotid pulses are on the right side with bruit and on the left side with bruit.      Radial pulses are 2+ on the right side and 2+ on the left side.       Popliteal pulses are 2+ on the right side and 2+ on the left side.       Dorsalis pedis pulses are  2+ on the right side and 2+ on the left side.       Posterior tibial pulses are 0 on the right side and 0 on the left side.     Heart sounds: Murmur heard.  Medium-pitched midsystolic murmur is present with a grade of 2/6 at the upper right sternal border radiating to the neck.      Comments: No leg edema. No JVD.  Pulmonary:     Effort: Pulmonary effort is normal.     Breath sounds: Normal breath sounds. No wheezing.  Abdominal:     Palpations: Abdomen is soft.     Tenderness: There is no abdominal tenderness.  Musculoskeletal:        General: Normal range of motion.     Cervical back: Neck supple.  Skin:    General: Skin is warm and dry.  Neurological:     Mental Status: She is alert and oriented to person, place, and time.    Laboratory examination:   Recent Labs    12/08/19 1043 03/30/20 1050  04/13/20 1110  NA 139 139 136  K 4.6 4.5 5.2  CL 99 103 95*  CO2 21 22 23   GLUCOSE 119* 103* 117*  BUN 22 19 42*  CREATININE 0.95 0.82 1.50*  CALCIUM 10.4* 10.1 10.6*  GFRNONAA 61 73 35*  GFRAA 70 84 40*   CMP Latest Ref Rng & Units 04/13/2020 03/30/2020 12/08/2019  Glucose 65 - 99 mg/dL 117(H) 103(H) 119(H)  BUN 8 - 27 mg/dL 42(H) 19 22  Creatinine 0.57 - 1.00 mg/dL 1.50(H) 0.82 0.95  Sodium 134 - 144 mmol/L 136 139 139  Potassium 3.5 - 5.2 mmol/L 5.2 4.5 4.6  Chloride 96 - 106 mmol/L 95(L) 103 99  CO2 20 - 29 mmol/L 23 22 21   Calcium 8.7 - 10.3 mg/dL 10.6(H) 10.1 10.4(H)  Total Protein 6.1 - 8.1 g/dL - - -  Total Bilirubin 0.2 - 1.2 mg/dL - - -  Alkaline Phos 33 - 130 U/L - - -  AST 10 - 35 U/L - - -  ALT 6 - 29 U/L - - -   CBC Latest Ref Rng & Units 11/19/2016  WBC 3.8 - 10.8 K/uL 6.5  Hemoglobin 11.7 - 15.5 g/dL 10.8(L)  Hematocrit 35 - 45 % 34.8(L)  Platelets 140 - 400 K/uL 281   Lipid Panel Recent Labs    11/12/19 0816  CHOL 160  TRIG 118  LDLCALC 61  HDL 79     BNP (last 3 results) Recent Labs    03/30/20 1050  BNP 24.7   ProBNP (last 3 results) Recent Labs    04/13/20 1109  PROBNP 36    HEMOGLOBIN A1C  A1c 5.9% in 07/2019  TSH No results for input(s): TSH in the last 8760 hours. Medications and allergies   Allergies  Allergen Reactions  . Aspirin     Current Outpatient Medications  Medication Instructions  . amLODipine (NORVASC) 10 MG tablet TAKE 1 TABLET BY MOUTH EVERY DAY  . atorvastatin (LIPITOR) 10 mg, Oral, Daily  . Blood Pressure Monitor MISC Disp. 1 b/p monitor.  . Cholecalciferol (VITAMIN D3 SUPER STRENGTH) 50 MCG (2000 UT) CAPS Oral, Daily  . furosemide (LASIX) 40 mg, Oral, Daily PRN  . gabapentin (NEURONTIN) 400 mg, Oral, 3 times daily  . ibuprofen (ADVIL) 800 mg, Oral, 3 times daily PRN  . KLOR-CON M20 20 MEQ tablet TAKE 1 TABLET (20 MEQ TOTAL) BY MOUTH DAILY AS NEEDED (WITH FUROSEMIDE).  Marland Kitchen  lisinopril-hydrochlorothiazide  (PRINZIDE,ZESTORETIC) 20-25 MG tablet 1 tablet, Oral, Daily  . methocarbamol (ROBAXIN) 750 mg, Oral, 3 times daily  . Multiple Vitamins-Minerals (MULTIVITAMIN WOMEN PO) Oral, Daily  . QUEtiapine (SEROQUEL) 50 mg, Oral, At bedtime PRN  . spironolactone (ALDACTONE) 25 MG tablet TAKE 1 TABLET BY MOUTH EVERY DAY  . traMADol (ULTRAM) 50 mg, Oral, Every 6 hours PRN   Radiology:   Cardiac Studies:   Lexiscan Tetrofosmin stress test 09/06/2019: No previous exam available for comparison. Lexiscan nuclear stress test performed using 1-day protocol.  SPECT stress and rest images demonstrate normal myocardial perfusion. Septal dyskinesis with LVEF 30%. High risk study due to reduced LVEF. Recommend clinical correlation.   Carotid artery duplex  98/92/1194: Peak systolic velocities in the right bifurcation, internal, external and common carotid arteries are within normal limits. Left carotid stenosis in the range of 16-49% with moderate heterogeneous plaque. Antegrade right vertebral artery flow. Antegrade left vertebral artery flow. Follow up in one year is appropriate if clinically indicated.  Echocardiogram 04/06/2020:  Study Quality: Technically Difficult  Normal LV systolic function with visual EF 55-60%. Left ventricle cavity  is normal in size. Mild left ventricular hypertrophy. Normal global wall  motion. Normal diastolic filling pattern, normal LAP. Calculated EF 57%.  Moderate (Grade II) aortic regurgitation.  Mild tricuspid regurgitation. No evidence of pulmonary hypertension.  No significant change compared to previous study dated 08/19/2019.  EKG:  EKG 03/30/2020: Normal sinus rhythm at rate of 89 bpm, normal axis, poor R wave progression, cannot exclude anteroseptal infarct old.  No evidence of ischemia.  Normal QT interval.   ABNORMAL.  No significant change from 08/06/2019.  Assessment     ICD-10-CM   1. Essential hypertension  R74 Basic metabolic panel    Basic metabolic  panel  2. Shortness of breath  R06.02 PCV MYOCARDIAL PERFUSION WITH LEXISCAN  3. Chronic diastolic (congestive) heart failure (HCC)  I50.32   4. Stenosis of left carotid artery  I65.22   5. Acute renal failure due to angiotensin converting enzyme (ACE) inhibitor ([place])  N17.9    T46.4X5A   6. Angina pectoris ([place])  I20.9 PCV MYOCARDIAL PERFUSION WITH LEXISCAN     Recommendations:   Sarah Cooper  is a 69 y.o. African American female with hypertension, mild asymptomatic left carotid stenosis, mild hyperlipidemia, prediabetes mellitus, insomnia, bilateral mild leg edema secondary to varicose veins, degenerative arthritis, elevated BMI (BMI 39) previously evaluated for dyspnea on exertion, has had a low risk stress test in 2020.    I had seen her on 03/30/2020 for acute worsening dyspnea and orthopnea and leg edema.  I had started her on furosemide and obtain labs and she now presents for follow-up. her renal function is worsened with creatinine jumping up to 1.50 with treatment with furosemide.  Advised her to discontinue furosemide and to take it only if she has more than 3 pound weight gain or marked dyspnea.  I will recheck her BMP in 2 weeks.  Although BNP was normal, she clearly had fluid excess, most probably related to her poor diet and excessive weight.  I reviewed her echocardiogram, no evidence of severe pulmonary hypertension or RV failure, aortic valve regurgitation has remained stable.  I discussed with the patient and her daughter at the bedside regarding making lifestyle changes specifically with regard to fluid excess and dyspnea.  Chest pain symptoms are concerning, previously she has had a intermediate risk study although normal perfusion but had low LVEF and may indicate  multivessel disease.  In view of exertional chest pain suggestive of angina pectoris, I recommended repeating the stress test.  I would like to see her back in 4 weeks for follow-up.   Adrian Prows, MD,  [facility] 04/27/2020, 10:06 PM Office: [phone]

## 2020-04-27 ENCOUNTER — Encounter: Payer: Self-pay | Admitting: Cardiology

## 2020-05-10 ENCOUNTER — Other Ambulatory Visit: Payer: Medicare Other

## 2020-05-22 ENCOUNTER — Other Ambulatory Visit: Payer: Medicare Other

## 2020-05-25 ENCOUNTER — Other Ambulatory Visit: Payer: Self-pay | Admitting: Cardiology

## 2020-05-25 DIAGNOSIS — I5033 Acute on chronic diastolic (congestive) heart failure: Secondary | ICD-10-CM

## 2020-06-05 ENCOUNTER — Other Ambulatory Visit: Payer: Medicare Other

## 2020-06-12 ENCOUNTER — Ambulatory Visit: Payer: Medicare Other | Admitting: Cardiology

## 2020-06-20 ENCOUNTER — Other Ambulatory Visit: Payer: Self-pay | Admitting: Cardiology

## 2020-06-20 DIAGNOSIS — R0602 Shortness of breath: Secondary | ICD-10-CM

## 2020-06-20 DIAGNOSIS — I209 Angina pectoris, unspecified: Secondary | ICD-10-CM

## 2020-06-21 ENCOUNTER — Other Ambulatory Visit: Payer: Medicare Other

## 2020-06-22 DIAGNOSIS — R2689 Other abnormalities of gait and mobility: Secondary | ICD-10-CM | POA: Diagnosis not present

## 2020-06-22 DIAGNOSIS — R42 Dizziness and giddiness: Secondary | ICD-10-CM | POA: Diagnosis not present

## 2020-06-22 DIAGNOSIS — I10 Essential (primary) hypertension: Secondary | ICD-10-CM | POA: Diagnosis not present

## 2020-06-22 DIAGNOSIS — J069 Acute upper respiratory infection, unspecified: Secondary | ICD-10-CM | POA: Diagnosis not present

## 2020-06-22 DIAGNOSIS — N390 Urinary tract infection, site not specified: Secondary | ICD-10-CM | POA: Diagnosis not present

## 2020-06-23 ENCOUNTER — Other Ambulatory Visit: Payer: Self-pay | Admitting: Cardiology

## 2020-06-23 DIAGNOSIS — I5033 Acute on chronic diastolic (congestive) heart failure: Secondary | ICD-10-CM

## 2020-06-26 ENCOUNTER — Telehealth: Payer: Self-pay | Admitting: Orthopedic Surgery

## 2020-06-26 NOTE — Telephone Encounter (Signed)
Patient called, then came to office to inquire about picking up copies of Xray films/reports from Dr Brooke Bonito Xrays done in 2020. Patient signed release, picked up reports,office notes; advised of contacting [place] radiology for copies of films.

## 2020-07-03 ENCOUNTER — Other Ambulatory Visit: Payer: Self-pay | Admitting: Orthopaedic Surgery

## 2020-07-03 DIAGNOSIS — M1612 Unilateral primary osteoarthritis, left hip: Secondary | ICD-10-CM | POA: Diagnosis not present

## 2020-07-04 ENCOUNTER — Ambulatory Visit: Payer: Medicare Other | Admitting: Cardiology

## 2020-07-04 ENCOUNTER — Telehealth: Payer: Self-pay | Admitting: Cardiology

## 2020-07-06 ENCOUNTER — Other Ambulatory Visit: Payer: Self-pay | Admitting: Cardiology

## 2020-07-06 DIAGNOSIS — I10 Essential (primary) hypertension: Secondary | ICD-10-CM

## 2020-07-10 ENCOUNTER — Other Ambulatory Visit: Payer: Self-pay

## 2020-07-10 ENCOUNTER — Ambulatory Visit: Payer: Medicare Other

## 2020-07-10 DIAGNOSIS — I209 Angina pectoris, unspecified: Secondary | ICD-10-CM | POA: Diagnosis not present

## 2020-07-10 DIAGNOSIS — R0602 Shortness of breath: Secondary | ICD-10-CM

## 2020-07-11 ENCOUNTER — Encounter: Payer: Self-pay | Admitting: Cardiology

## 2020-07-17 ENCOUNTER — Other Ambulatory Visit: Payer: Medicare Other

## 2020-07-17 NOTE — Care Plan (Signed)
Ortho Bundle Case Management Note  Patient Details  Name: Sarah Cooper MRN: [id number] Date of Birth: 24-Jun-1951     Spoke with patient prior to surgery. Will discharge to home with family to assist. Rolling walker ordered for home use. OPPT set up at [facility]. Patient and MD in agreement with plan. Choice offered                 DME Arranged:  Walker rolling DME Agency:  [employer]  HH Arranged:    [place] Agency:     Additional Comments: Please contact me with any questions of if this plan should need to change.  Ladell Heads,  [place] [facility]  [phone] 07/17/2020, 1:34 PM

## 2020-07-18 NOTE — Patient Instructions (Addendum)
DUE TO COVID-19 ONLY ONE VISITOR IS ALLOWED TO COME WITH YOU AND STAY IN THE WAITING ROOM ONLY DURING PRE OP AND PROCEDURE DAY OF SURGERY. THE 1 VISITOR  MAY VISIT WITH YOU AFTER SURGERY IN YOUR PRIVATE ROOM DURING VISITING HOURS ONLY!  YOU NEED TO HAVE A COVID 19 TEST ON_10/8______ @__9 :10_____, THIS TEST MUST BE DONE BEFORE SURGERY,  COVID TESTING SITE [place] [address], IT IS ON THE RIGHT GOING OUT [address] APPROXIMATELY  2 MINUTES PAST [employer] ON THE RIGHT. ONCE YOUR COVID TEST IS COMPLETED,  PLEASE BEGIN THE QUARANTINE INSTRUCTIONS AS OUTLINED IN YOUR HANDOUT.                Sarah Cooper    Your procedure is scheduled on:  07/25/20   Report to [facility] Main  Entrance   Report to short stay at 5:30 AM     Call this number if you have problems the morning of surgery Sarah Cooper, NO CHEWING GUM [name].   No food after midnight.    You may have clear liquid until 4:30 AM.    At 4:30 AM drink pre surgery drink.   Nothing by mouth after 4:30 AM.    Take these medicines the morning of surgery with A SIP OF WATER: Seroquel, Amlodipine,                                  You may not have any metal on your body including              piercings  Do not wear jewelry,  lotions, powders or deodorant              Men may shave face and neck.   Do not bring valuables to the hospital. Sarah Cooper.  Contacts, dentures or bridgework may not be worn into surgery.       Patients discharged the day of surgery will not be allowed to drive home.   IF YOU ARE HAVING SURGERY AND GOING HOME THE SAME DAY, YOU MUST HAVE AN ADULT TO DRIVE YOU HOME AND BE WITH YOU FOR 24 HOURS.   YOU MAY GO HOME BY TAXI OR UBER OR ORTHERWISE, BUT AN ADULT MUST ACCOMPANY YOU HOME AND STAY WITH YOU FOR 24 HOURS.  Name and phone  number of your driver:  Special Instructions: N/A              Please read over the following fact sheets you were given: _____________________________________________________________________             [name] - Preparing for Surgery  Before surgery, you can play an important role.  Because skin is not sterile, your skin needs to be as free of germs as possible.  You can reduce the number of germs on your skin by washing with CHG (chlorahexidine gluconate) soap before surgery.  CHG is an antiseptic cleaner which kills germs and bonds with the skin to continue killing germs even after washing. Please DO NOT use if you have an allergy to CHG or antibacterial soaps.  If your skin becomes reddened/irritated stop using the CHG and inform your nurse when you arrive  at Short Stay.   You may shave your face/neck.  Please follow these instructions carefully:  1.  Shower with CHG Soap the night before surgery and the  morning of Surgery.  2.  If you choose to wash your hair, wash your hair first as usual with your  normal  shampoo.  3.  After you shampoo, rinse your hair and body thoroughly to remove the  shampoo.                                        4.  Use CHG as you would any other liquid soap.  You can apply chg directly  to the skin and wash                       Gently with a scrungie or clean washcloth.  5.  Apply the CHG Soap to your body ONLY FROM THE NECK DOWN.   Do not use on face/ open                           Wound or open sores. Avoid contact with eyes, ears mouth and genitals (private parts).                       Wash face,  Genitals (private parts) with your normal soap.             6.  Wash thoroughly, paying special attention to the area where your surgery  will be performed.  7.  Thoroughly rinse your body with warm water from the neck down.  8.  DO NOT shower/wash with your normal soap after using and rinsing off  the CHG Soap.             9.  Pat yourself dry with a  clean towel.            10.  Wear clean pajamas.            11.  Place clean sheets on your bed the night of your first shower and do not  sleep with pets. Day of Surgery : Do not apply any lotions/deodorants the morning of surgery.  Please wear clean clothes to the hospital/surgery center.  FAILURE TO FOLLOW THESE INSTRUCTIONS MAY RESULT IN THE CANCELLATION OF YOUR SURGERY PATIENT SIGNATURE_________________________________  NURSE SIGNATURE__________________________________  ________________________________________________________________________   Sarah Cooper  An incentive spirometer is a tool that can help keep your lungs clear and active. This tool measures how well you are filling your lungs with each breath. Taking long deep breaths may help reverse or decrease the chance of developing breathing (pulmonary) problems (especially infection) following:  A long period of time when you are unable to move or be active. BEFORE THE PROCEDURE   If the spirometer includes an indicator to show your best effort, your nurse or respiratory therapist will set it to a desired goal.  If possible, sit up straight or lean slightly forward. Try not to slouch.  Hold the incentive spirometer in an upright position. INSTRUCTIONS FOR USE  1. Sit on the edge of your bed if possible, or sit up as far as you can in bed or on a chair. 2. Hold the incentive spirometer in an upright position. 3. Breathe out normally. 4. Place the mouthpiece in your mouth and  seal your lips tightly around it. 5. Breathe in slowly and as deeply as possible, raising the piston or the ball toward the top of the column. 6. Hold your breath for 3-5 seconds or for as long as possible. Allow the piston or ball to fall to the bottom of the column. 7. Remove the mouthpiece from your mouth and breathe out normally. 8. Rest for a few seconds and repeat Steps 1 through 7 at least 10 times every 1-2 hours when you are awake.  Take your time and take a few normal breaths between deep breaths. 9. The spirometer may include an indicator to show your best effort. Use the indicator as a goal to work toward during each repetition. 10. After each set of 10 deep breaths, practice coughing to be sure your lungs are clear. If you have an incision (the cut made at the time of surgery), support your incision when coughing by placing a pillow or rolled up towels firmly against it. Once you are able to get out of bed, walk around indoors and cough well. You may stop using the incentive spirometer when instructed by your caregiver.  RISKS AND COMPLICATIONS  Take your time so you do not get dizzy or light-headed.  If you are in pain, you may need to take or ask for pain medication before doing incentive spirometry. It is harder to take a deep breath if you are having pain. AFTER USE  Rest and breathe slowly and easily.  It can be helpful to keep track of a log of your progress. Your caregiver can provide you with a simple table to help with this. If you are using the spirometer at home, follow these instructions: [place] IF:   You are having difficultly using the spirometer.  You have trouble using the spirometer as often as instructed.  Your pain medication is not giving enough relief while using the spirometer.  You develop fever of 100.5 F (38.1 C) or higher. SEEK IMMEDIATE MEDICAL CARE IF:   You cough up bloody sputum that had not been present before.  You develop fever of 102 F (38.9 C) or greater.  You develop worsening pain at or near the incision site. MAKE SURE YOU:   Understand these instructions.  Will watch your condition.  Will get help right away if you are not doing well or get worse. Document Released: 02/10/2007 Document Revised: 12/23/2011 Document Reviewed: 04/13/2007 [facility] Patient Information 2014 [place],  [place].   ________________________________________________________________________

## 2020-07-19 ENCOUNTER — Other Ambulatory Visit: Payer: Self-pay

## 2020-07-19 ENCOUNTER — Encounter (HOSPITAL_COMMUNITY)
Admission: RE | Admit: 2020-07-19 | Discharge: 2020-07-19 | Disposition: A | Discharge status: Home / Self Care | Payer: Medicare Other | Source: Ambulatory Visit | Attending: Orthopaedic Surgery | Admitting: Orthopaedic Surgery

## 2020-07-19 ENCOUNTER — Encounter (HOSPITAL_COMMUNITY): Payer: Self-pay

## 2020-07-19 ENCOUNTER — Ambulatory Visit (HOSPITAL_COMMUNITY)
Admission: RE | Admit: 2020-07-19 | Discharge: 2020-07-19 | Disposition: A | Discharge status: Home / Self Care | Payer: Medicare Other | Source: Ambulatory Visit | Attending: Orthopaedic Surgery | Admitting: Orthopaedic Surgery

## 2020-07-19 DIAGNOSIS — Z01818 Encounter for other preprocedural examination: Secondary | ICD-10-CM | POA: Diagnosis not present

## 2020-07-19 LAB — CBC WITH DIFFERENTIAL/PLATELET
Abs Immature Granulocytes: 0.04 10*3/uL (ref 0.00–0.07)
Basophils Absolute: 0.1 10*3/uL (ref 0.0–0.1)
Basophils Relative: 1 %
Eosinophils Absolute: 0.6 10*3/uL — ABNORMAL HIGH (ref 0.0–0.5)
Eosinophils Relative: 7 %
HCT: 37 % (ref 36.0–46.0)
Hemoglobin: 11.5 g/dL — ABNORMAL LOW (ref 12.0–15.0)
Immature Granulocytes: 0 %
Lymphocytes Relative: 14 %
Lymphs Abs: 1.3 10*3/uL (ref 0.7–4.0)
MCH: 26.2 pg (ref 26.0–34.0)
MCHC: 31.1 g/dL (ref 30.0–36.0)
MCV: 84.3 fL (ref 80.0–100.0)
Monocytes Absolute: 0.6 10*3/uL (ref 0.1–1.0)
Monocytes Relative: 6 %
Neutro Abs: 6.7 10*3/uL (ref 1.7–7.7)
Neutrophils Relative %: 72 %
Platelets: 293 10*3/uL (ref 150–400)
RBC: 4.39 MIL/uL (ref 3.87–5.11)
RDW: 14.9 % (ref 11.5–15.5)
WBC: 9.3 10*3/uL (ref 4.0–10.5)
nRBC: 0 % (ref 0.0–0.2)

## 2020-07-19 LAB — URINALYSIS, ROUTINE W REFLEX MICROSCOPIC
Bacteria, UA: NONE SEEN
Bilirubin Urine: NEGATIVE
Glucose, UA: NEGATIVE mg/dL
Ketones, ur: NEGATIVE mg/dL
Leukocytes,Ua: NEGATIVE
Nitrite: NEGATIVE
Protein, ur: NEGATIVE mg/dL
Specific Gravity, Urine: 1.015 (ref 1.005–1.030)
pH: 6 (ref 5.0–8.0)

## 2020-07-19 LAB — BASIC METABOLIC PANEL
Anion gap: 11 (ref 5–15)
BUN: 23 mg/dL (ref 8–23)
CO2: 25 mmol/L (ref 22–32)
Calcium: 9.8 mg/dL (ref 8.9–10.3)
Chloride: 101 mmol/L (ref 98–111)
Creatinine, Ser: 0.76 mg/dL (ref 0.44–1.00)
GFR calc non Af Amer: >60 mL/min (ref >60)
Glucose, Bld: 117 mg/dL — ABNORMAL HIGH (ref 70–99)
Potassium: 4.6 mmol/L (ref 3.5–5.1)
Sodium: 137 mmol/L (ref 135–145)

## 2020-07-19 LAB — PROTIME-INR
INR: 1.1 (ref 0.8–1.2)
Prothrombin Time: 13.5 seconds (ref 11.4–15.2)

## 2020-07-19 LAB — SURGICAL PCR SCREEN
MRSA, PCR: NEGATIVE
Staphylococcus aureus: NEGATIVE

## 2020-07-19 LAB — APTT: aPTT: 32 seconds (ref 24–36)

## 2020-07-19 NOTE — Progress Notes (Addendum)
COVID Vaccine Completed:yes Date COVID Vaccine completed:12/21/19 COVID vaccine manufacturer:  Moderna    PCP - Dr. Ignacia Bayley Cardiologist - Dr. Christen Butter  Chest x-ray - no EKG - 03/30/20-Epic Stress Test - 07/10/20-Epic ECHO - 04/06/20-Epic Cardiac Cath - no Pacemaker/ICD device last checked:NA  Sleep Study - no CPAP -   Fasting Blood Sugar - NA Checks Blood Sugar _____ times a day  Blood Thinner Instructions:NA Aspirin Instructions: Last Dose:  Anesthesia review:   Patient denies shortness of breath, fever, cough and chest pain at PAT appointment yes   Patient verbalized understanding of instructions that were given to them at the PAT appointment. Patient was also instructed that they will need to review over the PAT instructions again at home before surgery. Yes Pt reports SOB after 13 steps. She went to Dr. Einar Gip and Pt thinks it is due to weight gain.

## 2020-07-21 ENCOUNTER — Other Ambulatory Visit (HOSPITAL_COMMUNITY)
Admission: RE | Admit: 2020-07-21 | Discharge: 2020-07-21 | Disposition: A | Discharge status: Home / Self Care | Payer: Medicare Other | Source: Ambulatory Visit | Attending: Orthopaedic Surgery | Admitting: Orthopaedic Surgery

## 2020-07-21 DIAGNOSIS — Z01812 Encounter for preprocedural laboratory examination: Secondary | ICD-10-CM | POA: Insufficient documentation

## 2020-07-21 DIAGNOSIS — Z20822 Contact with and (suspected) exposure to covid-19: Secondary | ICD-10-CM | POA: Insufficient documentation

## 2020-07-21 LAB — SARS CORONAVIRUS 2 (TAT 6-24 HRS): SARS Coronavirus 2: NEGATIVE

## 2020-07-21 NOTE — H&P (Signed)
TOTAL HIP ADMISSION H&P  Patient is admitted for left total hip arthroplasty.  Subjective:  Chief Complaint: left hip pain  HPI: Sarah Cooper, 69 y.o. female, has a history of pain and functional disability in the left hip(s) due to arthritis and patient has failed non-surgical conservative treatments for greater than 12 weeks to include NSAID's and/or analgesics, flexibility and strengthening excercises, supervised PT with diminished ADL's post treatment, use of assistive devices, weight reduction as appropriate and activity modification.  Onset of symptoms was gradual starting 5 years ago with gradually worsening course since that time.The patient noted no past surgery on the left hip(s).  Patient currently rates pain in the left hip at 10 out of 10 with activity. Patient has night pain, worsening of pain with activity and weight bearing, trendelenberg gait, pain that interfers with activities of daily living and crepitus. Patient has evidence of subchondral cysts, subchondral sclerosis, periarticular osteophytes and joint space narrowing by imaging studies. This condition presents safety issues increasing the risk of falls. There is no current active infection.  Patient Active Problem List   Diagnosis Date Noted  . Tubular adenoma of colon 12/11/2016  . Dyspnea 12/11/2016  . Vitamin D deficiency 11/21/2016  . Pre-diabetes 11/21/2016  . Essential hypertension 11/19/2016  . Depression, recurrent ([place]) 11/19/2016  . Osteoarthritis 11/19/2016  . Chronic back pain greater than 3 months duration 11/19/2016  . History of cervical dysplasia 11/19/2016  . Morbid obesity ([place]) 11/19/2016   Past Medical History:  Diagnosis Date  . Anxiety   . Arthritis   . Cancer ([place]) 1978   cervical  . Depression   . Hypertension   . Pneumothorax 1977    Past Surgical History:  Procedure Laterality Date  . BREAST BIOPSY Left    approx @ 48 benign   . HIP SURGERY  2007   right  . JOINT  REPLACEMENT  2007    No current facility-administered medications for this encounter.   Current Outpatient Medications  Medication Sig Dispense Refill Last Dose  . amLODipine (NORVASC) 10 MG tablet TAKE 1 TABLET BY MOUTH EVERY DAY (Patient taking differently: Take 10 mg by mouth daily. ) 90 tablet 3   . atorvastatin (LIPITOR) 10 MG tablet Take 1 tablet (10 mg total) by mouth daily. 90 tablet 3   . ibuprofen (ADVIL) 800 MG tablet Take 800 mg by mouth 3 (three) times daily as needed for moderate pain.      Marland Kitchen lisinopril-hydrochlorothiazide (PRINZIDE,ZESTORETIC) 20-25 MG tablet Take 1 tablet by mouth daily. 90 tablet 3   . meloxicam (MOBIC) 15 MG tablet Take 15 mg by mouth daily.     . QUEtiapine (SEROQUEL) 50 MG tablet Take 50 mg by mouth at bedtime.      Marland Kitchen spironolactone (ALDACTONE) 25 MG tablet TAKE 1 TABLET BY MOUTH EVERY DAY (Patient taking differently: Take 25 mg by mouth daily. ) 90 tablet 0   . traMADol (ULTRAM) 50 MG tablet Take 1 tablet (50 mg total) by mouth every 6 (six) hours as needed. 120 tablet 0   . Blood Pressure Monitor MISC Disp. 1 b/p monitor. 1 each 0   . Cholecalciferol (VITAMIN D3 SUPER STRENGTH) 50 MCG (2000 UT) CAPS Take 2,000 Units by mouth daily.  (Patient not taking: Reported on 07/10/2020)   Not Taking at Unknown time  . furosemide (LASIX) 40 MG tablet TAKE 1 TABLET (40 MG TOTAL) BY MOUTH DAILY AS NEEDED (SHORTNESS OF BREATH. 2 LBS WEIGHT GAIN). (Patient not taking:  Reported on 07/10/2020) 90 tablet 0 Not Taking at Unknown time  . gabapentin (NEURONTIN) 400 MG capsule Take 1 capsule (400 mg total) by mouth 3 (three) times daily. (Patient not taking: Reported on 07/10/2020) 90 capsule 0 Not Taking at Unknown time  . KLOR-CON M20 20 MEQ tablet TAKE 1 TABLET (20 MEQ TOTAL) BY MOUTH DAILY AS NEEDED (WITH FUROSEMIDE). (Patient not taking: Reported on 07/10/2020) 30 tablet 3 Not Taking at Unknown time  . methocarbamol (ROBAXIN) 750 MG tablet Take 750 mg by mouth 3 (three) times  daily. (Patient not taking: Reported on 07/10/2020)   Not Taking at Unknown time  . Multiple Vitamins-Minerals (MULTIVITAMIN WOMEN PO) Take by mouth daily. (Patient not taking: Reported on 07/10/2020)   Not Taking at Unknown time   No Known Allergies  Social History   Tobacco Use  . Smoking status: Former Smoker    Packs/day: 1.00    Years: 50.00    Pack years: 50.00    Types: Cigarettes    Start date: 1966    Quit date: 10/14/2010    Years since quitting: 9.7  . Smokeless tobacco: Never Used  Substance Use Topics  . Alcohol use: Yes    Alcohol/week: 3.0 standard drinks    Types: 3 Glasses of wine per week    Family History  Problem Relation Age of Onset  . Heart disease Mother 69  . Arthritis Mother   . Diabetes Mother   . Alcohol abuse Father   . Cancer Father 8       lung  . Drug abuse Daughter   . Asthma Son   . Hypertension Son   . Mental illness Maternal Aunt   . Diabetes Maternal Grandmother   . Diabetes Paternal Grandmother   . Mental illness Daughter        schizophrenia  . Depression Daughter   . Mental illness Daughter        anxiety  . Obesity Daughter        bariatric surgery  . Heart disease Maternal Aunt      Review of Systems  Musculoskeletal: Positive for arthralgias.       Left hip  All other systems reviewed and are negative.   Objective:  Physical Exam Constitutional:      Appearance: Normal appearance.  HENT:     Head: Normocephalic and atraumatic.     Nose: Nose normal.     Mouth/Throat:     Pharynx: Oropharynx is clear.  Eyes:     Extraocular Movements: Extraocular movements intact.  Cardiovascular:     Rate and Rhythm: Normal rate and regular rhythm.  Pulmonary:     Effort: Pulmonary effort is normal.  Abdominal:     Palpations: Abdomen is soft.  Musculoskeletal:     Comments: Sarah Cooper is moderately overweight with a BMI of 38.  She has limited motion of the left hip which is extremely painful.  She walks with an altered gait using  a cane.  Sensation and motor function are intact distally with palpable pulses.   Skin:    General: Skin is warm and dry.  Neurological:     General: No focal deficit present.     Mental Status: She is alert and oriented to person, place, and time.  Psychiatric:        Mood and Affect: Mood normal.        Behavior: Behavior normal.     Vital signs in last 24 hours:    Labs:  Estimated body mass index is 37.87 kg/m as calculated from the following:   Height as of 07/19/20: 5\' 2"  (1.575 m).   Weight as of 07/19/20: 93.9 kg.   Imaging Review Plain radiographs demonstrate severe degenerative joint disease of the left hip(s). The bone quality appears to be good for age and reported activity level.      Assessment/Plan:  End stage arthritis, left hip(s)  The patient history, physical examination, clinical judgement of the provider and imaging studies are consistent with end stage degenerative joint disease of the left hip(s) and total hip arthroplasty is deemed medically necessary. The treatment options including medical management, injection therapy, arthroscopy and arthroplasty were discussed at length. The risks and benefits of total hip arthroplasty were presented and reviewed. The risks due to aseptic loosening, infection, stiffness, dislocation/subluxation,  thromboembolic complications and other imponderables were discussed.  The patient acknowledged the explanation, agreed to proceed with the plan and consent was signed. Patient is being admitted for inpatient treatment for surgery, pain control, PT, OT, prophylactic antibiotics, VTE prophylaxis, progressive ambulation and ADL's and discharge planning.The patient is planning to be discharged home with home health services

## 2020-07-22 NOTE — Telephone Encounter (Signed)
Patient now scheduled for stress test

## 2020-07-24 MED ORDER — BUPIVACAINE LIPOSOME 1.3 % IJ SUSP
10.0000 mL | Freq: Once | INTRAMUSCULAR | Status: DC
  Filled 2020-07-24: qty 10

## 2020-07-24 MED ORDER — TRANEXAMIC ACID 1000 MG/10ML IV SOLN
2000.0000 mg | INTRAVENOUS | Status: DC
  Filled 2020-07-24: qty 20

## 2020-07-25 ENCOUNTER — Ambulatory Visit (HOSPITAL_COMMUNITY)
Admission: RE | Admit: 2020-07-25 | Discharge: 2020-07-25 | Disposition: A | Discharge status: Home / Self Care | Payer: Medicare Other | Source: Ambulatory Visit | Attending: Orthopaedic Surgery | Admitting: Orthopaedic Surgery

## 2020-07-25 ENCOUNTER — Ambulatory Visit (HOSPITAL_COMMUNITY): Payer: Medicare Other | Admitting: Certified Registered Nurse Anesthetist

## 2020-07-25 ENCOUNTER — Ambulatory Visit (HOSPITAL_COMMUNITY): Payer: Medicare Other

## 2020-07-25 ENCOUNTER — Encounter (HOSPITAL_COMMUNITY)
Admission: RE | Disposition: A | Discharge status: Home / Self Care | Payer: Self-pay | Source: Ambulatory Visit | Attending: Orthopaedic Surgery

## 2020-07-25 ENCOUNTER — Encounter (HOSPITAL_COMMUNITY): Payer: Self-pay | Admitting: Orthopaedic Surgery

## 2020-07-25 DIAGNOSIS — Z8349 Family history of other endocrine, nutritional and metabolic diseases: Secondary | ICD-10-CM | POA: Insufficient documentation

## 2020-07-25 DIAGNOSIS — Z811 Family history of alcohol abuse and dependence: Secondary | ICD-10-CM | POA: Diagnosis not present

## 2020-07-25 DIAGNOSIS — Z791 Long term (current) use of non-steroidal anti-inflammatories (NSAID): Secondary | ICD-10-CM | POA: Insufficient documentation

## 2020-07-25 DIAGNOSIS — Z825 Family history of asthma and other chronic lower respiratory diseases: Secondary | ICD-10-CM | POA: Insufficient documentation

## 2020-07-25 DIAGNOSIS — Z419 Encounter for procedure for purposes other than remedying health state, unspecified: Secondary | ICD-10-CM

## 2020-07-25 DIAGNOSIS — Z801 Family history of malignant neoplasm of trachea, bronchus and lung: Secondary | ICD-10-CM | POA: Insufficient documentation

## 2020-07-25 DIAGNOSIS — I10 Essential (primary) hypertension: Secondary | ICD-10-CM | POA: Insufficient documentation

## 2020-07-25 DIAGNOSIS — M1612 Unilateral primary osteoarthritis, left hip: Secondary | ICD-10-CM | POA: Diagnosis not present

## 2020-07-25 DIAGNOSIS — Z833 Family history of diabetes mellitus: Secondary | ICD-10-CM | POA: Diagnosis not present

## 2020-07-25 DIAGNOSIS — Z6837 Body mass index (BMI) 37.0-37.9, adult: Secondary | ICD-10-CM | POA: Insufficient documentation

## 2020-07-25 DIAGNOSIS — Z818 Family history of other mental and behavioral disorders: Secondary | ICD-10-CM | POA: Diagnosis not present

## 2020-07-25 DIAGNOSIS — F32A Depression, unspecified: Secondary | ICD-10-CM | POA: Diagnosis not present

## 2020-07-25 DIAGNOSIS — Z8249 Family history of ischemic heart disease and other diseases of the circulatory system: Secondary | ICD-10-CM | POA: Insufficient documentation

## 2020-07-25 DIAGNOSIS — Z471 Aftercare following joint replacement surgery: Secondary | ICD-10-CM | POA: Diagnosis not present

## 2020-07-25 DIAGNOSIS — Z8541 Personal history of malignant neoplasm of cervix uteri: Secondary | ICD-10-CM | POA: Insufficient documentation

## 2020-07-25 DIAGNOSIS — F419 Anxiety disorder, unspecified: Secondary | ICD-10-CM | POA: Diagnosis not present

## 2020-07-25 DIAGNOSIS — G8929 Other chronic pain: Secondary | ICD-10-CM | POA: Diagnosis not present

## 2020-07-25 DIAGNOSIS — Z8489 Family history of other specified conditions: Secondary | ICD-10-CM | POA: Insufficient documentation

## 2020-07-25 DIAGNOSIS — Z86010 Personal history of colonic polyps: Secondary | ICD-10-CM | POA: Diagnosis not present

## 2020-07-25 DIAGNOSIS — R7303 Prediabetes: Secondary | ICD-10-CM | POA: Insufficient documentation

## 2020-07-25 DIAGNOSIS — M549 Dorsalgia, unspecified: Secondary | ICD-10-CM | POA: Diagnosis not present

## 2020-07-25 DIAGNOSIS — R0602 Shortness of breath: Secondary | ICD-10-CM | POA: Insufficient documentation

## 2020-07-25 DIAGNOSIS — Z87891 Personal history of nicotine dependence: Secondary | ICD-10-CM | POA: Diagnosis not present

## 2020-07-25 DIAGNOSIS — Z8261 Family history of arthritis: Secondary | ICD-10-CM | POA: Diagnosis not present

## 2020-07-25 DIAGNOSIS — Z96642 Presence of left artificial hip joint: Secondary | ICD-10-CM | POA: Diagnosis not present

## 2020-07-25 DIAGNOSIS — E559 Vitamin D deficiency, unspecified: Secondary | ICD-10-CM | POA: Diagnosis not present

## 2020-07-25 DIAGNOSIS — Z79899 Other long term (current) drug therapy: Secondary | ICD-10-CM | POA: Insufficient documentation

## 2020-07-25 HISTORY — PX: TOTAL HIP ARTHROPLASTY: SHX124

## 2020-07-25 LAB — ABO/RH: ABO/RH(D): O POS

## 2020-07-25 LAB — TYPE AND SCREEN
ABO/RH(D): O POS
Antibody Screen: NEGATIVE

## 2020-07-25 SURGERY — ARTHROPLASTY, HIP, TOTAL, ANTERIOR APPROACH
Anesthesia: Monitor Anesthesia Care | Site: Hip | Laterality: Left

## 2020-07-25 MED ORDER — CEFAZOLIN SODIUM-DEXTROSE 2-4 GM/100ML-% IV SOLN: 2.0000 g | Freq: Four times a day (QID) | INTRAVENOUS | Status: DC

## 2020-07-25 MED ORDER — LACTATED RINGERS IV SOLN: INTRAVENOUS | Status: DC

## 2020-07-25 MED ORDER — EPHEDRINE SULFATE-NACL 50-0.9 MG/10ML-% IV SOSY
PREFILLED_SYRINGE | INTRAVENOUS | Status: DC | PRN
  Administered 2020-07-25 (×4): 10 mg via INTRAVENOUS

## 2020-07-25 MED ORDER — ORAL CARE MOUTH RINSE: 15.0000 mL | Freq: Once | OROMUCOSAL | Status: AC

## 2020-07-25 MED ORDER — TIZANIDINE HCL 4 MG PO TABS: 4.0000 mg | ORAL_TABLET | Freq: Four times a day (QID) | ORAL | 1 refills | Status: DC | PRN

## 2020-07-25 MED ORDER — HYDROCODONE-ACETAMINOPHEN 7.5-325 MG PO TABS
1.0000 | ORAL_TABLET | ORAL | Status: DC | PRN
  Administered 2020-07-25: 1 via ORAL

## 2020-07-25 MED ORDER — ONDANSETRON HCL 4 MG/2ML IJ SOLN: 4.0000 mg | Freq: Four times a day (QID) | INTRAMUSCULAR | Status: DC | PRN

## 2020-07-25 MED ORDER — FENTANYL CITRATE (PF) 100 MCG/2ML IJ SOLN: 25.0000 ug | INTRAMUSCULAR | Status: DC | PRN

## 2020-07-25 MED ORDER — FENTANYL CITRATE (PF) 100 MCG/2ML IJ SOLN
INTRAMUSCULAR | Status: AC
  Filled 2020-07-25: qty 2

## 2020-07-25 MED ORDER — ONDANSETRON HCL 4 MG/2ML IJ SOLN
INTRAMUSCULAR | Status: AC
  Filled 2020-07-25: qty 2

## 2020-07-25 MED ORDER — OXYCODONE HCL 5 MG/5ML PO SOLN: 5.0000 mg | Freq: Once | ORAL | Status: DC | PRN

## 2020-07-25 MED ORDER — ONDANSETRON HCL 4 MG PO TABS
4.0000 mg | ORAL_TABLET | Freq: Four times a day (QID) | ORAL | Status: DC | PRN
  Filled 2020-07-25: qty 1

## 2020-07-25 MED ORDER — PROPOFOL 10 MG/ML IV BOLUS
INTRAVENOUS | Status: DC | PRN
  Administered 2020-07-25 (×2): 20 mg via INTRAVENOUS

## 2020-07-25 MED ORDER — METHOCARBAMOL 500 MG PO TABS: 500.0000 mg | ORAL_TABLET | Freq: Four times a day (QID) | ORAL | Status: DC | PRN

## 2020-07-25 MED ORDER — METHOCARBAMOL 500 MG IVPB - SIMPLE MED
INTRAVENOUS | Status: AC
  Filled 2020-07-25: qty 50

## 2020-07-25 MED ORDER — PHENYLEPHRINE 40 MCG/ML (10ML) SYRINGE FOR IV PUSH (FOR BLOOD PRESSURE SUPPORT)
PREFILLED_SYRINGE | INTRAVENOUS | Status: DC | PRN
  Administered 2020-07-25 (×3): 80 ug via INTRAVENOUS

## 2020-07-25 MED ORDER — FENTANYL CITRATE (PF) 100 MCG/2ML IJ SOLN
INTRAMUSCULAR | Status: DC | PRN
Start: 2020-07-25 — End: 2020-07-25
  Administered 2020-07-25 (×2): 50 ug via INTRAVENOUS

## 2020-07-25 MED ORDER — STERILE WATER FOR IRRIGATION IR SOLN
Status: DC | PRN
  Administered 2020-07-25: 2000 mL

## 2020-07-25 MED ORDER — OXYCODONE HCL 5 MG PO TABS: 5.0000 mg | ORAL_TABLET | Freq: Once | ORAL | Status: DC | PRN

## 2020-07-25 MED ORDER — CHLORHEXIDINE GLUCONATE 0.12 % MT SOLN
15.0000 mL | Freq: Once | OROMUCOSAL | Status: AC
  Administered 2020-07-25: 15 mL via OROMUCOSAL

## 2020-07-25 MED ORDER — LACTATED RINGERS IV BOLUS
250.0000 mL | Freq: Once | INTRAVENOUS | Status: AC
  Administered 2020-07-25: 250 mL via INTRAVENOUS

## 2020-07-25 MED ORDER — TRANEXAMIC ACID-NACL 1000-0.7 MG/100ML-% IV SOLN
1000.0000 mg | INTRAVENOUS | Status: AC
  Administered 2020-07-25: 1000 mg via INTRAVENOUS
  Filled 2020-07-25: qty 100

## 2020-07-25 MED ORDER — ACETAMINOPHEN 500 MG PO TABS
ORAL_TABLET | ORAL | Status: AC
  Filled 2020-07-25: qty 1

## 2020-07-25 MED ORDER — BUPIVACAINE-EPINEPHRINE (PF) 0.25% -1:200000 IJ SOLN
INTRAMUSCULAR | Status: DC | PRN
  Administered 2020-07-25: 30 mL

## 2020-07-25 MED ORDER — PHENYLEPHRINE HCL-NACL 20-0.9 MG/250ML-% IV SOLN
INTRAVENOUS | Status: DC | PRN
  Administered 2020-07-25: 30 ug/min via INTRAVENOUS

## 2020-07-25 MED ORDER — ASPIRIN EC 81 MG PO TBEC: 81.0000 mg | DELAYED_RELEASE_TABLET | Freq: Two times a day (BID) | ORAL | 0 refills | Status: DC

## 2020-07-25 MED ORDER — KETOROLAC TROMETHAMINE 15 MG/ML IJ SOLN
INTRAMUSCULAR | Status: AC
  Filled 2020-07-25: qty 1

## 2020-07-25 MED ORDER — TRANEXAMIC ACID 1000 MG/10ML IV SOLN
INTRAVENOUS | Status: DC | PRN
  Administered 2020-07-25: 2000 mg via TOPICAL

## 2020-07-25 MED ORDER — PROPOFOL 500 MG/50ML IV EMUL
INTRAVENOUS | Status: DC | PRN
  Administered 2020-07-25: 75 ug/kg/min via INTRAVENOUS

## 2020-07-25 MED ORDER — TRANEXAMIC ACID-NACL 1000-0.7 MG/100ML-% IV SOLN
1000.0000 mg | Freq: Once | INTRAVENOUS | Status: AC
  Administered 2020-07-25: 1000 mg via INTRAVENOUS

## 2020-07-25 MED ORDER — 0.9 % SODIUM CHLORIDE (POUR BTL) OPTIME
TOPICAL | Status: DC | PRN
  Administered 2020-07-25: 1000 mL

## 2020-07-25 MED ORDER — BUPIVACAINE LIPOSOME 1.3 % IJ SUSP
INTRAMUSCULAR | Status: DC | PRN
  Administered 2020-07-25: 10 mL

## 2020-07-25 MED ORDER — HYDROCODONE-ACETAMINOPHEN 5-325 MG PO TABS: 1.0000 | ORAL_TABLET | ORAL | Status: DC | PRN

## 2020-07-25 MED ORDER — HYDROCODONE-ACETAMINOPHEN 7.5-325 MG PO TABS
ORAL_TABLET | ORAL | Status: AC
  Filled 2020-07-25: qty 1

## 2020-07-25 MED ORDER — HYDROCODONE-ACETAMINOPHEN 5-325 MG PO TABS: 1.0000 | ORAL_TABLET | Freq: Four times a day (QID) | ORAL | 0 refills | Status: DC | PRN

## 2020-07-25 MED ORDER — CEFAZOLIN SODIUM-DEXTROSE 2-4 GM/100ML-% IV SOLN
2.0000 g | INTRAVENOUS | Status: AC
  Administered 2020-07-25: 2 g via INTRAVENOUS
  Filled 2020-07-25: qty 100

## 2020-07-25 MED ORDER — METHOCARBAMOL 500 MG IVPB - SIMPLE MED
500.0000 mg | Freq: Four times a day (QID) | INTRAVENOUS | Status: DC | PRN
  Administered 2020-07-25: 500 mg via INTRAVENOUS

## 2020-07-25 MED ORDER — DEXAMETHASONE SODIUM PHOSPHATE 10 MG/ML IJ SOLN
INTRAMUSCULAR | Status: DC | PRN
  Administered 2020-07-25: 5 mg via INTRAVENOUS

## 2020-07-25 MED ORDER — TRANEXAMIC ACID-NACL 1000-0.7 MG/100ML-% IV SOLN
INTRAVENOUS | Status: AC
  Filled 2020-07-25: qty 100

## 2020-07-25 MED ORDER — ONDANSETRON HCL 4 MG/2ML IJ SOLN
INTRAMUSCULAR | Status: DC | PRN
  Administered 2020-07-25: 4 mg via INTRAVENOUS

## 2020-07-25 MED ORDER — DEXAMETHASONE SODIUM PHOSPHATE 10 MG/ML IJ SOLN
INTRAMUSCULAR | Status: AC
  Filled 2020-07-25: qty 1

## 2020-07-25 MED ORDER — PROPOFOL 1000 MG/100ML IV EMUL
INTRAVENOUS | Status: AC
  Filled 2020-07-25: qty 100

## 2020-07-25 MED ORDER — KETOROLAC TROMETHAMINE 15 MG/ML IJ SOLN
7.5000 mg | Freq: Four times a day (QID) | INTRAMUSCULAR | Status: DC
  Administered 2020-07-25: 7.5 mg via INTRAVENOUS

## 2020-07-25 MED ORDER — LACTATED RINGERS IV BOLUS
500.0000 mL | Freq: Once | INTRAVENOUS | Status: AC
  Administered 2020-07-25: 500 mL via INTRAVENOUS

## 2020-07-25 MED ORDER — PROPOFOL 10 MG/ML IV BOLUS
INTRAVENOUS | Status: AC
  Filled 2020-07-25: qty 40

## 2020-07-25 MED ORDER — BUPIVACAINE IN DEXTROSE 0.75-8.25 % IT SOLN
INTRATHECAL | Status: DC | PRN
  Administered 2020-07-25: 1.6 mL via INTRATHECAL

## 2020-07-25 MED ORDER — POVIDONE-IODINE 10 % EX SWAB
2.0000 "application " | Freq: Once | CUTANEOUS | Status: AC
  Administered 2020-07-25: 2 via TOPICAL

## 2020-07-25 MED ORDER — ACETAMINOPHEN 500 MG PO TABS
500.0000 mg | ORAL_TABLET | Freq: Four times a day (QID) | ORAL | Status: DC
  Administered 2020-07-25: 500 mg via ORAL

## 2020-07-25 MED ORDER — BUPIVACAINE-EPINEPHRINE (PF) 0.25% -1:200000 IJ SOLN
INTRAMUSCULAR | Status: AC
  Filled 2020-07-25: qty 30

## 2020-07-25 SURGICAL SUPPLY — 45 items
BAG DECANTER FOR FLEXI CONT (MISCELLANEOUS) ×3 IMPLANT
BALL HIP CERAMIC (Hips) ×1 IMPLANT
BLADE SAW SGTL 18X1.27X75 (BLADE) ×2 IMPLANT
BLADE SAW SGTL 18X1.27X75MM (BLADE) ×1
BOOTIES KNEE HIGH SLOAN (MISCELLANEOUS) ×3 IMPLANT
CELLS DAT CNTRL 66122 CELL SVR (MISCELLANEOUS) ×1 IMPLANT
COVER PERINEAL POST (MISCELLANEOUS) ×3 IMPLANT
COVER SURGICAL LIGHT HANDLE (MISCELLANEOUS) ×3 IMPLANT
COVER WAND RF STERILE (DRAPES) IMPLANT
DECANTER SPIKE VIAL GLASS SM (MISCELLANEOUS) ×3 IMPLANT
DRAPE IMP U-DRAPE 54X76 (DRAPES) ×3 IMPLANT
DRAPE ORTHO SPLIT 77X108 STRL (DRAPES)
DRAPE STERI IOBAN 125X83 (DRAPES) ×3 IMPLANT
DRAPE SURG ORHT 6 SPLT 77X108 (DRAPES) IMPLANT
DRAPE U-SHAPE 47X51 STRL (DRAPES) ×6 IMPLANT
DRSG AQUACEL AG ADV 3.5X 6 (GAUZE/BANDAGES/DRESSINGS) ×3 IMPLANT
DURAPREP 26ML APPLICATOR (WOUND CARE) ×3 IMPLANT
ELECT BLADE TIP CTD 4 INCH (ELECTRODE) ×3 IMPLANT
ELECT REM PT RETURN 15FT ADLT (MISCELLANEOUS) ×3 IMPLANT
ELIMINATOR HOLE APEX DEPUY (Hips) ×3 IMPLANT
GLOVE BIO SURGEON STRL SZ8 (GLOVE) ×6 IMPLANT
GLOVE BIOGEL PI IND STRL 8 (GLOVE) ×2 IMPLANT
GLOVE BIOGEL PI INDICATOR 8 (GLOVE) ×4
GOWN STRL REUS W/TWL XL LVL3 (GOWN DISPOSABLE) ×6 IMPLANT
HIP BALL CERAMIC (Hips) ×3 IMPLANT
HOLDER FOLEY CATH W/STRAP (MISCELLANEOUS) ×3 IMPLANT
KIT TURNOVER KIT A (KITS) ×3 IMPLANT
LINER ACETABULAR 32X50 (Liner) ×3 IMPLANT
LINER PINN ACET GRIP 50X100 ×3 IMPLANT
MANIFOLD NEPTUNE II (INSTRUMENTS) ×3 IMPLANT
NEEDLE HYPO 22GX1.5 SAFETY (NEEDLE) ×3 IMPLANT
NS IRRIG 1000ML POUR BTL (IV SOLUTION) ×3 IMPLANT
PACK ANTERIOR HIP CUSTOM (KITS) ×3 IMPLANT
PENCIL SMOKE EVACUATOR (MISCELLANEOUS) IMPLANT
PROTECTOR NERVE ULNAR (MISCELLANEOUS) ×3 IMPLANT
RTRCTR WOUND ALEXIS 18CM MED (MISCELLANEOUS) ×3
STEM FEM ACTIS HIGH SZ3 (Stem) ×3 IMPLANT
SUT ETHIBOND NAB CT1 #1 30IN (SUTURE) ×6 IMPLANT
SUT VIC AB 1 CT1 36 (SUTURE) ×3 IMPLANT
SUT VIC AB 2-0 CT1 27 (SUTURE) ×3
SUT VIC AB 2-0 CT1 TAPERPNT 27 (SUTURE) ×1 IMPLANT
SUT VICRYL AB 3-0 FS1 BRD 27IN (SUTURE) ×3 IMPLANT
SUT VLOC 180 0 24IN GS25 (SUTURE) ×3 IMPLANT
SYR 50ML LL SCALE MARK (SYRINGE) ×3 IMPLANT
TRAY FOLEY MTR SLVR 14FR STAT (SET/KITS/TRAYS/PACK) ×3 IMPLANT

## 2020-07-25 NOTE — Anesthesia Preprocedure Evaluation (Signed)
Anesthesia Evaluation  Patient identified by MRN, date of birth, ID band Patient awake    Reviewed: Allergy & Precautions, H&P , NPO status , Patient's Chart, lab work & pertinent test results  Airway Mallampati: II   Neck ROM: full    Dental   Pulmonary shortness of breath, former smoker,    breath sounds clear to auscultation       Cardiovascular hypertension,  Rhythm:regular Rate:Normal     Neuro/Psych PSYCHIATRIC DISORDERS Anxiety Depression    GI/Hepatic   Endo/Other    Renal/GU      Musculoskeletal  (+) Arthritis ,   Abdominal   Peds  Hematology   Anesthesia Other Findings   Reproductive/Obstetrics                             Anesthesia Physical Anesthesia Plan  ASA: II  Anesthesia Plan: MAC and Spinal   Post-op Pain Management:    Induction: Intravenous  PONV Risk Score and Plan: 2 and Propofol infusion, Ondansetron and Treatment may vary due to age or medical condition  Airway Management Planned: Simple Face Mask  Additional Equipment:   Intra-op Plan:   Post-operative Plan:   Informed Consent: I have reviewed the patients History and Physical, chart, labs and discussed the procedure including the risks, benefits and alternatives for the proposed anesthesia with the patient or authorized representative who has indicated his/her understanding and acceptance.       Plan Discussed with: CRNA, Anesthesiologist and Surgeon  Anesthesia Plan Comments:         Anesthesia Quick Evaluation

## 2020-07-25 NOTE — Anesthesia Procedure Notes (Signed)
Spinal  Patient location during procedure: OR Start time: 07/25/2020 7:50 AM End time: 07/25/2020 7:53 AM Staffing Performed: anesthesiologist  Anesthesiologist: Albertha Ghee, MD Preanesthetic Checklist Completed: patient identified, IV checked, risks and benefits discussed, surgical consent, monitors and equipment checked, pre-op evaluation and timeout performed Spinal Block Patient position: sitting Prep: DuraPrep Patient monitoring: cardiac monitor, continuous pulse ox and blood pressure Approach: left paramedian Location: L3-4 Injection technique: single-shot Needle Needle type: Pencan  Needle gauge: 24 G Needle length: 9 cm Assessment Sensory level: T10 Additional Notes Functioning IV was confirmed and monitors were applied. Sterile prep and drape, including hand hygiene and sterile gloves were used. The patient was positioned and the spine was prepped. The skin was anesthetized with lidocaine.  Free flow of clear CSF was obtained prior to injecting local anesthetic into the CSF.  The spinal needle aspirated freely following injection.  The needle was carefully withdrawn.  The patient tolerated the procedure well.

## 2020-07-25 NOTE — Interval H&P Note (Signed)
History and Physical Interval Note:  07/25/2020 7:36 AM  Sarah Cooper  has presented today for surgery, with the diagnosis of [place].  The various methods of treatment have been discussed with the patient and family. After consideration of risks, benefits and other options for treatment, the patient has consented to  Procedure(s): LEFT TOTAL HIP ARTHROPLASTY ANTERIOR APPROACH (Left) as a surgical intervention.  The patient's history has been reviewed, patient examined, no change in status, stable for surgery.  I have reviewed the patient's chart and labs.  Questions were answered to the patient's satisfaction.     Hessie Dibble

## 2020-07-25 NOTE — Anesthesia Procedure Notes (Signed)
Procedure Name: MAC Date/Time: 07/25/2020 7:46 AM Performed by: West Pugh, CRNA Pre-anesthesia Checklist: Patient identified, Emergency Drugs available, Suction available, Patient being monitored and Timeout performed Patient Re-evaluated:Patient Re-evaluated prior to induction Oxygen Delivery Method: Simple face mask Preoxygenation: Pre-oxygenation with 100% oxygen Induction Type: IV induction Placement Confirmation: positive ETCO2 Dental Injury: Teeth and Oropharynx as per pre-operative assessment

## 2020-07-25 NOTE — Transfer of Care (Signed)
Immediate Anesthesia Transfer of Care Note  Patient: Sarah Cooper  Procedure(s) Performed: LEFT TOTAL HIP ARTHROPLASTY ANTERIOR APPROACH (Left Hip)  Patient Location: PACU  Anesthesia Type:MAC and Spinal  Level of Consciousness: awake, alert , oriented and patient cooperative  Airway & Oxygen Therapy: Patient Spontanous Breathing and Patient connected to face mask oxygen  Post-op Assessment: Report given to RN and Post -op Vital signs reviewed and stable  Post vital signs: Reviewed and stable  Last Vitals:  Vitals Value Taken Time  BP  07/25/20 0936  Temp    Pulse 84 07/25/20 0938  Resp 16 07/25/20 0938  SpO2 98 % 07/25/20 0938  Vitals shown include unvalidated device data.  Last Pain:  Vitals:   07/25/20 0612  TempSrc:   PainSc: 0-No pain         Complications: No complications documented.

## 2020-07-25 NOTE — Evaluation (Signed)
Physical Therapy Evaluation Patient Details Name: Sarah Cooper MRN: [id number] DOB: Apr 23, 1951 Today's Date: 07/25/2020   History of Present Illness  Patient is 69 y.o. female s/p Lt THA anterior aproach on 07/25/20 with PMH significant for HTN, OA, cervical cancer, depression, anxiety.    Clinical Impression  THEA HOLSHOUSER is a 69 y.o. female POD 0 s/p Lt THA. Patient reports independence with mobility at baseline. Patient is now limited by functional impairments (see PT problem list below) and requires supervision/min guard for transfers and gait with RW. Patient was able to ambulate ~100 feet with RW and supervision and cues for safe walker management. Patient educated on safe sequencing for stair mobility and verbalized safe guarding position for people assisting with mobility. Patient instructed in exercises to facilitate ROM and circulation. Patient will benefit from continued skilled PT interventions to address impairments and progress towards PLOF. Patient has met mobility goals at adequate level for discharge home; will continue to follow if pt continues acute stay to progress towards Mod I goals.     Follow Up Recommendations Follow surgeon's recommendation for DC plan and follow-up therapies;Outpatient PT    Equipment Recommendations  Rolling walker with 5" wheels (youth)    Recommendations for Other Services       Precautions / Restrictions Precautions Precautions: Fall Restrictions Weight Bearing Restrictions: No Other Position/Activity Restrictions: WBAT      Mobility  Bed Mobility Overal bed mobility: Needs Assistance Bed Mobility: Supine to Sit     Supine to sit: Min guard;Min assist     General bed mobility comments: cues for use of belt to mobilize Lt LE, light assist to support Lt LE and scoot to EOB.  Transfers Overall transfer level: Needs assistance Equipment used: Rolling walker (2 wheeled) Transfers: Sit to/from Stand Sit to Stand: Min  guard;Supervision         General transfer comment: cues for technique with RW, no assist required from EOB or toilet. pt steady with rising.  Ambulation/Gait Ambulation/Gait assistance: Supervision Gait Distance (Feet): 100 Feet Assistive device: Rolling walker (2 wheeled) Gait Pattern/deviations: Step-to pattern;Decreased stride length;Decreased weight shift to left Gait velocity: decr   General Gait Details: cues for step pattern and proximity to RW, no overt LOB noted.   Stairs Stairs: Yes Stairs assistance: Min guard Stair Management: Two rails;No rails;Step to pattern;Forwards;With walker Number of Stairs: 3 General stair comments: cues for single step up wtih RW, min guard for safety, pt demonstrated safe sequencing. completed step up to platfrom with hand rails and safe step pattern. educated on step down with RW on bottom step for curb negotiation.  Wheelchair Mobility    Modified Rankin (Stroke Patients Only)       Balance Overall balance assessment: Mild deficits observed, not formally tested;Needs assistance Sitting-balance support: Feet supported Sitting balance-Leahy Scale: Good     Standing balance support: During functional activity;Bilateral upper extremity supported Standing balance-Leahy Scale: Fair                               Pertinent Vitals/Pain Pain Assessment: 0-10 Pain Score: 3  Pain Location: Lt hip Pain Descriptors / Indicators: Aching;Discomfort Pain Intervention(s): Limited activity within patient's tolerance;Monitored during session;Premedicated before session;Repositioned;Ice applied    Home Living Family/patient expects to be discharged to:: Private residence Living Arrangements: Alone Available Help at Discharge: Family Type of Home: Apartment Home Access: Level entry (curb from parking lot)  Home Layout: One level Home Equipment: Shower seat;Toilet riser;Grab bars - tub/shower      Prior Function Level of  Independence: Independent               Hand Dominance   Dominant Hand: Right    Extremity/Trunk Assessment   Upper Extremity Assessment Upper Extremity Assessment: Overall WFL for tasks assessed    Lower Extremity Assessment Lower Extremity Assessment: Overall WFL for tasks assessed    Cervical / Trunk Assessment Cervical / Trunk Assessment: Normal  Communication   Communication: No difficulties  Cognition Arousal/Alertness: Awake/alert Behavior During Therapy: WFL for tasks assessed/performed Overall Cognitive Status: Within Functional Limits for tasks assessed                                        General Comments      Exercises Total Joint Exercises Ankle Circles/Pumps: AROM;Both;20 reps;Seated Quad Sets: AROM;Left;5 reps;Seated Short Arc Quad: AROM;Left;5 reps;Seated Heel Slides: AAROM;Left;5 reps;Seated Hip ABduction/ADduction: AAROM;Left;5 reps;Seated   Assessment/Plan    PT Assessment Patient needs continued PT services  PT Problem List Decreased strength;Decreased range of motion;Decreased activity tolerance;Decreased balance;Decreased mobility;Decreased knowledge of use of DME;Decreased knowledge of precautions       PT Treatment Interventions DME instruction;Gait training;Stair training;Functional mobility training;Therapeutic activities;Therapeutic exercise;Balance training;Patient/family education    PT Goals (Current goals can be found in the Care Plan section)  Acute Rehab PT Goals Patient Stated Goal: get back to water aerobics, walking, travel at Thanksgiving PT Goal Formulation: With patient Time For Goal Achievement: 07/25/20 Potential to Achieve Goals: Good    Frequency 7X/week   Barriers to discharge        Co-evaluation               AM-PAC PT "6 Clicks" Mobility  Outcome Measure Help needed turning from your back to your side while in a flat bed without using bedrails?: None Help needed moving from  lying on your back to sitting on the side of a flat bed without using bedrails?: A Little Help needed moving to and from a bed to a chair (including a wheelchair)?: A Little Help needed standing up from a chair using your arms (e.g., wheelchair or bedside chair)?: A Little Help needed to walk in hospital room?: A Little Help needed climbing 3-5 steps with a railing? : A Little 6 Click Score: 19    End of Session Equipment Utilized During Treatment: Gait belt Activity Tolerance: Patient tolerated treatment well Patient left: in chair;with call bell/phone within reach Nurse Communication: Mobility status PT Visit Diagnosis: Muscle weakness (generalized) (M62.81);Difficulty in walking, not elsewhere classified (R26.2)    Time: 1518-3437 PT Time Calculation (min) (ACUTE ONLY): 43 min   Charges:   PT Evaluation $PT Eval Low Complexity: 1 Low PT Treatments $Gait Training: 8-22 mins $Therapeutic Exercise: 8-22 mins        Verner Mould, DPT Acute Rehabilitation Services  Office [phone] Pager [phone]  07/25/2020 2:52 PM

## 2020-07-25 NOTE — Op Note (Signed)
PRE-OP DIAGNOSIS:  LEFT HIP DEGENERATIVE JOINT DISEASE POST-OP DIAGNOSIS: same PROCEDURE:  LEFT TOTAL HIP ARTHROPLASTY ANTERIOR APPROACH ANESTHESIA:  Spinal and MAC SURGEON:  Melrose Nakayama MD ASSISTANT:  Loni Dolly PA-C   INDICATIONS FOR PROCEDURE:  The patient is a 69 y.o. female with a long history of a painful hip.  This has persisted despite multiple conservative measures.  The patient has persisted with pain and dysfunction making rest and activity difficult.  A total hip replacement is offered as surgical treatment.  Informed operative consent was obtained after discussion of possible complications including reaction to anesthesia, infection, neurovascular injury, dislocation, DVT, PE, and death.  The importance of the postoperative rehab program to optimize result was stressed with the patient.  SUMMARY OF FINDINGS AND PROCEDURE:  Under the above anesthesia through a anterior approach an the Hana table a left THR was performed.  The patient had severe degenerative change and good bone quality.  We used DePuy components to replace the hip and these were size 3 high offset Actis femur capped with a +5 47m ceramic hip ball.  On the acetabular side we used a size 50 Gription shell with a plus 0 neutral polyethylene liner.  We did use a hole eliminator.  ALoni DollyPA-C assisted throughout and was invaluable to the completion of the case in that he helped position and retract while I performed the procedure.  He also closed simultaneously to help minimize OR time.  I used fluoroscopy throughout the case to check position of components and leg lengths and read all these views myself.  DESCRIPTION OF PROCEDURE:  The patient was taken to the OR suite where the above anesthetic was applied.  The patient was then positioned on the Hana table supine.  All bony prominences were appropriately padded.  Prep and drape was then performed in normal sterile fashion.  The patient was given kefzol preoperative  antibiotic and an appropriate time out was performed.  We then took an anterior approach to the left hip.  Dissection was taken through adipose to the tensor fascia lata fascia.  This structure was incised longitudinally and we dissected in the intermuscular interval just medial to this muscle.  Cobra retractors were placed superior and inferior to the femoral neck superficial to the capsule.  A capsular incision was then made and the retractors were placed along the femoral neck.  Xray was brought in to get a good level for the femoral neck cut which was made with an oscillating saw and osteotome.  The femoral head was removed with a corkscrew.  The acetabulum was exposed and some labral tissues were excised. Reaming was taken to the inside wall of the pelvis and sequentially up to 1 mm smaller than the actual component.  A trial of components was done and then the aforementioned acetabular shell was placed in appropriate tilt and anteversion confirmed by fluoroscopy. The liner was placed along with the hole eliminator and attention was turned to the femur.  The leg was brought down and over into adduction and the elevator bar was used to raise the femur up gently in the wound.  The piriformis was released with care taken to preserve the obturator internus attachment and all of the posterior capsule. The femur was reamed and then broached to the appropriate size.  A trial reduction was done and the aforementioned head and neck assembly gave uKoreathe best stability in extension with external rotation.  Leg lengths were felt to be about equal  by fluoroscopic exam.  The trial components were removed and the wound irrigated.  We then placed the femoral component in appropriate anteversion.  The head was applied to a dry stem neck and the hip again reduced.  It was again stable in the aforementioned position.  The would was irrigated again followed by re-approximation of anterior capsule with ethibond suture. Tensor  fascia was repaired with V-loc suture  followed by deep closure with #O and #2 undyed vicryl.  Skin was closed with subQ stitch and steristrips followed by a sterile dressing.  EBL and IOF can be obtained from anesthesia records.  DISPOSITION:  The patient was extubated in the OR and taken to PACU in stable condition to be admitted to the Orthopedic Surgery for appropriate post-op care to include perioperative antibiotics and DVT prophylaxis.

## 2020-07-25 NOTE — Anesthesia Postprocedure Evaluation (Signed)
Anesthesia Post Note  Patient: Sarah Cooper  Procedure(s) Performed: LEFT TOTAL HIP ARTHROPLASTY ANTERIOR APPROACH (Left Hip)     Patient location during evaluation: PACU Anesthesia Type: MAC and Spinal Level of consciousness: oriented and awake and alert Pain management: pain level controlled Vital Signs Assessment: post-procedure vital signs reviewed and stable Respiratory status: spontaneous breathing, respiratory function stable and patient connected to nasal cannula oxygen Cardiovascular status: blood pressure returned to baseline and stable Postop Assessment: no headache, no backache and no apparent nausea or vomiting Anesthetic complications: no   No complications documented.  Last Vitals:  Vitals:   07/25/20 1045 07/25/20 1100  BP: (!) 107/55 (!) 121/57  Pulse: 84 87  Resp: 20 15  Temp:    SpO2: 95% 97%    Last Pain:  Vitals:   07/25/20 1130  TempSrc:   PainSc: [name]

## 2020-07-26 ENCOUNTER — Encounter (HOSPITAL_COMMUNITY): Payer: Self-pay | Admitting: Orthopaedic Surgery

## 2020-07-27 ENCOUNTER — Encounter (HOSPITAL_COMMUNITY): Payer: Self-pay | Admitting: Physical Therapy

## 2020-07-27 ENCOUNTER — Other Ambulatory Visit: Payer: Self-pay

## 2020-07-27 ENCOUNTER — Ambulatory Visit (HOSPITAL_COMMUNITY): Payer: Medicare Other | Attending: Orthopaedic Surgery | Admitting: Physical Therapy

## 2020-07-27 DIAGNOSIS — R2689 Other abnormalities of gait and mobility: Secondary | ICD-10-CM

## 2020-07-27 DIAGNOSIS — R600 Localized edema: Secondary | ICD-10-CM

## 2020-07-27 DIAGNOSIS — R262 Difficulty in walking, not elsewhere classified: Secondary | ICD-10-CM

## 2020-07-27 DIAGNOSIS — M25552 Pain in left hip: Secondary | ICD-10-CM

## 2020-07-27 NOTE — Therapy (Signed)
[facility] [place], [place], [address] Phone: [phone]   Fax:  [phone]  Physical Therapy Evaluation  Patient Details  Name: Sarah Cooper MRN: [id number] Date of Birth: [birth year] Referring Provider (PT): Melrose Nakayama    Encounter Date: 07/27/2020   PT End of Session - 07/27/20 1130    Visit Number 1    Number of Visits 12    Date for PT Re-Evaluation 09/07/20    Authorization Type [employer]    Progress Note Due on Visit 10    PT Start Time 1050    PT Stop Time 1130    PT Time Calculation (min) 40 min           Past Medical History:  Diagnosis Date  . Anxiety   . Arthritis   . Cancer ([facility]) 1978   cervical  . Depression   . Hypertension   . Pneumothorax 1977    Past Surgical History:  Procedure Laterality Date  . BREAST BIOPSY Left    approx @ 48 benign   . HIP SURGERY  2007   right  . JOINT REPLACEMENT  2007  . TOTAL HIP ARTHROPLASTY Left 07/25/2020   Procedure: LEFT TOTAL HIP ARTHROPLASTY ANTERIOR APPROACH;  Surgeon: Melrose Nakayama, MD;  Location: [facility];  Service: Orthopedics;  Laterality: Left;    There were no vitals filed for this visit.    Subjective Assessment - 07/27/20 1103    Subjective Sarah Cooper states that her Lt hip has been bothering her for about 7 years; she opted to have THR on 07/25/2020.  Dr. Latanya Maudlin did an anterior approach and she has no limitations at this time.  She had her right hip replaced in 2007.    Pertinent History OA, HTN, Rt THR    How long can you sit comfortably? Able to sit for an hour at a time.    How long can you stand comfortably? Able to stand for 10-15 minutes priot to discomfort    How long can you walk comfortably? Able to walk for about 15 minutes with a walker.    Patient Stated Goals To walk without an assistive device, to go back to water aerobics, to go to [place]    Currently in Pain? Yes    Pain Score 8     Pain Location Hip      Pain Orientation Left    Pain Descriptors / Indicators Burning    Pain Radiating Towards knee    Pain Onset Today    Pain Frequency Constant    Aggravating Factors  activity    Pain Relieving Factors pain medication    Effect of Pain on Daily Activities limits              [facility] PT Assessment - 07/27/20 0001      Assessment   Medical Diagnosis Lt THR     Referring Provider (PT) Melrose Nakayama     Onset Date/Surgical Date 07/25/20    Next MD Visit 08/04/2020    Prior Therapy hospital       Precautions   Precautions Anterior Hip      Restrictions   Weight Bearing Restrictions No      Balance Screen   Has the patient fallen in the past 6 months Yes    How many times? 2    Has the patient had a decrease in activity level because of a fear of falling?  Yes  Home Environment   Living Environment Private residence    Living Arrangements Alone   daughter are with her for a week    Type of [place] One level      Prior Function   Level of Independence Independent      Cognition   Overall Cognitive Status Within Functional Limits for tasks assessed      Observation/Other Assessments   Focus on Therapeutic Outcomes (FOTO)  60% affected       Observation/Other Assessments-Edema    Edema --    PT needs assistance to raise leg to bed; + edema      Functional Tests   Functional tests Single Leg Squat;Single leg stance;Sit to Stand      ROM / Strength   AROM / PROM / Strength AROM;Strength      AROM   AROM Assessment Site Hip    Right/Left Hip Left    Left Hip Flexion 45      Strength   Strength Assessment Site Knee;Ankle    Right/Left Knee Left    Left Knee Extension 3+/5    Right/Left Ankle Left    Left Ankle Dorsiflexion 4/5      Ambulation/Gait   Ambulation/Gait Yes    Ambulation Distance (Feet) 90 Feet    Assistive device Rolling walker    Gait Comments 3 minutes                       Objective measurements  completed on examination: See above findings.       [facility] Adult PT Treatment/Exercise - 07/27/20 0001      Exercises   Exercises Knee/Hip      Knee/Hip Exercises: Seated   Long Arc Quad Left;5 reps    Other Seated Knee/Hip Exercises ankle pumps       Knee/Hip Exercises: Supine   Quad Sets Left;10 reps    Quad Sets Limitations glut set x 10    Heel Slides Left;5 reps    Other Supine Knee/Hip Exercises hip adduction isometric x 10                   PT Education - 07/27/20 1129    Education Details HEP    Person(s) Educated Patient    Methods Explanation    Comprehension Verbalized understanding            PT Short Term Goals - 07/27/20 1230      PT SHORT TERM GOAL #1   Title PT to be I in HEP to improve her functional ability    Time 2    Period Weeks    Status New    Target Date 08/17/20      PT SHORT TERM GOAL #2   Title PT hip strength to improve to allow pt to I place her LE on and off her bed    Time 2    Period Weeks    Status New      PT SHORT TERM GOAL #3   Title PT to be walking with her cane both in and out of the home.             PT Long Term Goals - 07/27/20 1232      PT LONG TERM GOAL #1   Title Pt to be I in advance HEP to allow pt to improve her functional ability    Time 6    Period Weeks  Status New    Target Date 09/07/20      PT LONG TERM GOAL #2   Title PT to be able to come sit to stand without UE 8 or more times in a 30" period.    Time 6    Period Weeks    Status New    Target Date 09/07/20      PT LONG TERM GOAL #3   Title PT strength in her LT hip and core to be increased to be able to go up and down 4 steps in a reciprocal manner using a handrail    Time 6    Period Weeks    Status New      PT LONG TERM GOAL #4   Title PT to be able to single leg stance for at least 20seconds on both LE to allow pt to feel confident walking without any assistive device.    Time 6    Period Weeks    Status New                    Plan - 07/27/20 1223    Clinical Impression Statement Sarah Cooper has had increased Lt hip pain for years but she put off having a THR until her leg would no longer hold her body weight up.  She opted to have an anterior hip replacement on 10/12 and is now being referred to skilled PT.  Evaluation demonstrates difficulty with walking,  decreased ROM, decreased strength, decrease activity tolerance, decreased balance, increased pain and increased edema. Sarah Cooper will benefit from skilled PT to address these issues and maximize her functional ability.    Personal Factors and Comorbidities Comorbidity 2    Comorbidities OA, HTN, back pain    Examination-Activity Limitations Bathing;Bed Mobility;Carry;Dressing;Lift;Locomotion Level;Squat;Stairs;Stand    Examination-Participation Restrictions Cleaning;Driving;Laundry;Meal Prep;Shop;Yard Work    Stability/Clinical Decision Making Stable/Uncomplicated    Clinical Decision Making Low    Rehab Potential Good    PT Frequency 2x / week    PT Duration 6 weeks    PT Treatment/Interventions ADLs/Self Care Home Management;Patient/family education;Gait training;Stair training;Therapeutic activities;Functional mobility training;Therapeutic exercise;Balance training;Manual techniques;Passive range of motion    PT Next Visit Plan Begin standing heelraises, minisquats, sit to stands, clams, SLR and bridges    PT Home Exercise Plan sitting LAQ, ankle pumps, heelslides, quad and glut sets hip adduction           Patient will benefit from skilled therapeutic intervention in order to improve the following deficits and impairments:  Abnormal gait, Decreased activity tolerance, Decreased balance, Decreased mobility, Decreased range of motion, Difficulty walking, Decreased strength, Increased edema, Pain  Visit Diagnosis: Pain in left hip - Plan: PT plan of care cert/re-cert  Other abnormalities of gait and mobility - Plan: PT plan of care  cert/re-cert  Localized edema - Plan: PT plan of care cert/re-cert  Difficulty in walking, not elsewhere classified - Plan: PT plan of care cert/re-cert     Problem List Patient Active Problem List   Diagnosis Date Noted  . Primary localized osteoarthritis of left hip 07/25/2020  . Tubular adenoma of colon 12/11/2016  . Dyspnea 12/11/2016  . Vitamin D deficiency 11/21/2016  . Pre-diabetes 11/21/2016  . Essential hypertension 11/19/2016  . Depression, recurrent ([facility]) 11/19/2016  . Osteoarthritis 11/19/2016  . Chronic back pain greater than 3 months duration 11/19/2016  . History of cervical dysplasia 11/19/2016  . Morbid obesity ([place] [facility]) 11/19/2016    Rayetta Humphrey, PT CLT  [phone] 07/27/2020, 12:38 PM  [facility] [address] [place], [place], [address] Phone: [phone]   Fax:  [phone]  Name: Sarah Cooper MRN: [id number] Date of Birth: Oct 10, 1951

## 2020-07-28 ENCOUNTER — Ambulatory Visit: Payer: Medicare Other | Admitting: Cardiology

## 2020-08-01 ENCOUNTER — Other Ambulatory Visit: Payer: Self-pay

## 2020-08-01 ENCOUNTER — Ambulatory Visit (HOSPITAL_COMMUNITY): Payer: Medicare Other | Admitting: Physical Therapy

## 2020-08-01 DIAGNOSIS — M25552 Pain in left hip: Secondary | ICD-10-CM | POA: Diagnosis not present

## 2020-08-01 DIAGNOSIS — R600 Localized edema: Secondary | ICD-10-CM

## 2020-08-01 DIAGNOSIS — R262 Difficulty in walking, not elsewhere classified: Secondary | ICD-10-CM

## 2020-08-01 DIAGNOSIS — R2689 Other abnormalities of gait and mobility: Secondary | ICD-10-CM | POA: Diagnosis not present

## 2020-08-01 NOTE — Therapy (Signed)
[facility] [place], [place], [address] Phone: [phone]   Fax:  [phone]  Physical Therapy Treatment  Patient Details  Name: Sarah Cooper MRN: [id number] Date of Birth: Apr 19, 1951 Referring Provider (PT): Melrose Nakayama    Encounter Date: 08/01/2020   PT End of Session - 08/01/20 1444    Visit Number 2    Number of Visits 12    Date for PT Re-Evaluation 09/07/20    Authorization Type [place]    Progress Note Due on Visit 10    PT Start Time 1400    PT Stop Time 1443    PT Time Calculation (min) 43 min    Equipment Utilized During Treatment Gait belt    Activity Tolerance Patient tolerated treatment well    Behavior During Therapy WFL for tasks assessed/performed           Past Medical History:  Diagnosis Date   Anxiety    Arthritis    Cancer ([place]) 1978   cervical   Depression    Hypertension    Pneumothorax 1977    Past Surgical History:  Procedure Laterality Date   BREAST BIOPSY Left    approx @ 48 benign    HIP SURGERY  2007   right   JOINT REPLACEMENT  2007   TOTAL HIP ARTHROPLASTY Left 07/25/2020   Procedure: LEFT TOTAL HIP ARTHROPLASTY ANTERIOR APPROACH;  Surgeon: Melrose Nakayama, MD;  Location: [facility];  Service: Orthopedics;  Laterality: Left;    There were no vitals filed for this visit.   Subjective Assessment - 08/01/20 1400    Subjective Pt has been completing her HEP but not as often as she should have.    Pertinent History OA, HTN, Rt THR    How long can you sit comfortably? Able to sit for an hour at a time.    How long can you stand comfortably? Able to stand for 10-15 minutes priot to discomfort    How long can you walk comfortably? Able to walk for about 15 minutes with a walker.    Currently in Pain? Yes    Pain Score 6     Pain Location Hip    Pain Orientation Left    Pain Descriptors / Indicators Burning    Pain Onset In the past 7 days     Aggravating Factors  exercises, actiivity    Pain Relieving Factors ice, meds    Effect of Pain on Daily Activities limits              [facility] PT Assessment - 08/01/20 0001      Strength   Strength Assessment Site Hip    Right/Left Hip Left    Left Hip Flexion 2/5    Left Hip Extension 2+/5    Left Hip ABduction 2/5                         [facility] Adult PT Treatment/Exercise - 08/01/20 0001      Ambulation/Gait   Ambulation/Gait Yes    Ambulation/Gait Assistance 6: Modified independent (Device/Increase time)    Ambulation Distance (Feet) 226 Feet    Assistive device Straight cane      Exercises   Exercises Knee/Hip      Knee/Hip Exercises: Standing   Heel Raises Both;10 reps    Functional Squat 10 reps      Knee/Hip Exercises: Seated   Long Arc Quad Left;10  reps    Long Arc Quad Weight 3 lbs.    Sit to [employer] 5 reps      Knee/Hip Exercises: Supine   Quad Sets 15 reps    Heel Slides 10 reps    Bridges Both;2 sets;5 reps    Other Supine Knee/Hip Exercises clam x 10/ bent knee raise x 5 x 2 sets     Other Supine Knee/Hip Exercises glut, ab and hip adduction isometric all together x 15 hold x 5 seconds       Knee/Hip Exercises: Sidelying   Hip ABduction Strengthening;Left;5 reps    Hip ABduction Limitations with leg flexed at hip and knee                     PT Short Term Goals - 08/01/20 1438      PT SHORT TERM GOAL #1   Title PT to be I in HEP to improve her functional ability    Time 2    Period Weeks    Status On-going    Target Date 08/17/20      PT SHORT TERM GOAL #2   Title PT hip strength to improve to allow pt to I place her LE on and off her bed    Time 2    Period Weeks    Status On-going      PT SHORT TERM GOAL #3   Title PT to be walking with her cane both in and out of the home.    Status On-going             PT Long Term Goals - 08/01/20 1440      PT LONG TERM GOAL #1   Title Pt to be I in advance HEP to  allow pt to improve her functional ability    Time 6    Period Weeks    Status On-going      PT LONG TERM GOAL #2   Title PT to be able to come sit to stand without UE 8 or more times in a 30" period.    Time 6    Period Weeks    Status On-going      PT LONG TERM GOAL #3   Title PT strength in her LT hip and core to be increased to be able to go up and down 4 steps in a reciprocal manner using a handrail    Time 6    Period Weeks    Status On-going      PT LONG TERM GOAL #4   Title PT to be able to single leg stance for at least 20seconds on both LE to allow pt to feel confident walking without any assistive device.    Time 6    Period Weeks    Status On-going                 Plan - 08/01/20 1445    Clinical Impression Statement Evaluation and goals reviewed with paitent.  Therapist gt trained pt with SPC.  Therapist then advanced exercises for improved ROM and strength.  PT tolerated well and was able to complete all activity with good technique after verbal cuing .    Personal Factors and Comorbidities Comorbidity 2    Examination-Activity Limitations Bathing;Bed Mobility;Carry;Dressing;Lift;Locomotion Level;Squat;Stairs;Stand    Examination-Participation Restrictions Cleaning;Driving;Laundry;Meal Prep;Shop;Yard Work    Stability/Clinical Decision Making Stable/Uncomplicated    Rehab Potential Good    PT Frequency 2x / week  PT Duration 6 weeks    PT Treatment/Interventions ADLs/Self Care Home Management;Patient/family education;Gait training;Stair training;Therapeutic activities;Functional mobility training;Therapeutic exercise;Balance training;Manual techniques;Passive range of motion    PT Next Visit Plan give  standing heelraises, minisquats, sit to stands, clams, SLR and bridges as HEP.  Begin step up and down using 4" step and Single leg stance    PT Home Exercise Plan sitting LAQ, ankle pumps, heelslides, quad and glut sets hip adduction           Patient  will benefit from skilled therapeutic intervention in order to improve the following deficits and impairments:  Abnormal gait, Decreased activity tolerance, Decreased balance, Decreased mobility, Decreased range of motion, Difficulty walking, Decreased strength, Increased edema, Pain  Visit Diagnosis: Pain in left hip  Other abnormalities of gait and mobility  Localized edema  Difficulty in walking, not elsewhere classified     Problem List Patient Active Problem List   Diagnosis Date Noted   Primary localized osteoarthritis of left hip 07/25/2020   Tubular adenoma of colon 12/11/2016   Dyspnea 12/11/2016   Vitamin D deficiency 11/21/2016   Pre-diabetes 11/21/2016   Essential hypertension 11/19/2016   Depression, recurrent ([place]) 11/19/2016   Osteoarthritis 11/19/2016   Chronic back pain greater than 3 months duration 11/19/2016   History of cervical dysplasia 11/19/2016   Morbid obesity ([place]) 11/19/2016    Rayetta Humphrey, PT CLT [phone] 08/01/2020, 2:47 PM  [facility] [address] [place], [place], [address] Phone: [phone]   Fax:  [phone]  Name: Sarah Cooper MRN: [id number] Date of Birth: 05-10-1951

## 2020-08-03 ENCOUNTER — Other Ambulatory Visit: Payer: Self-pay

## 2020-08-03 ENCOUNTER — Ambulatory Visit (HOSPITAL_COMMUNITY): Payer: Medicare Other

## 2020-08-03 ENCOUNTER — Encounter (HOSPITAL_COMMUNITY): Payer: Self-pay

## 2020-08-03 DIAGNOSIS — M25552 Pain in left hip: Secondary | ICD-10-CM | POA: Diagnosis not present

## 2020-08-03 DIAGNOSIS — R600 Localized edema: Secondary | ICD-10-CM | POA: Diagnosis not present

## 2020-08-03 DIAGNOSIS — R2689 Other abnormalities of gait and mobility: Secondary | ICD-10-CM

## 2020-08-03 DIAGNOSIS — R262 Difficulty in walking, not elsewhere classified: Secondary | ICD-10-CM

## 2020-08-03 NOTE — Patient Instructions (Signed)
Bridge    Lie back, legs bent. Inhale, pressing hips up. Keeping ribs in, lengthen lower back. Exhale, rolling down along spine from top. Repeat 10  times. Do 2 sessions per day.  http://pm.exer.us/55   Copyright  VHI. All rights reserved.   Straight Leg Raise    Tighten stomach and slowly raise locked right leg ____ inches from floor. Repeat 10 times per set. Do 2 sets per day.  http://orth.exer.us/1103   Copyright  VHI. All rights reserved.   Abduction: Clam (Eccentric) - Side-Lying    Lie on side with knees bent. Lift top knee, keeping feet together. Keep trunk steady.  Slowly lower for 3-5 seconds. 10 reps per set, 2 sets per day, 4 days per week.  http://ecce.exer.us/65   Copyright  VHI. All rights reserved.   Functional Quadriceps: Sit to Stand    Sit on edge of chair, feet flat on floor. Stand upright, extending knees fully. Repeat 10 times per set. Do 2 sets per day.  http://orth.exer.us/735   Copyright  VHI. All rights reserved.   Toe / Heel Raise (Standing)    Standing with support, raise heels, then rock back on heels and raise toes. Repeat 10 times.  Copyright  VHI. All rights reserved.   FUNCTIONAL MOBILITY: Squat    Stance: shoulder-width on floor. Bend hips and knees. Keep back straight. Do not allow knees to bend past toes.  Squeeze glutes and quads to stand. 10 reps per set, 2 sets per day, 4 days per week  Copyright  VHI. All rights reserved.

## 2020-08-03 NOTE — Therapy (Signed)
[place], [place], [address] Phone: [phone]   Fax:  [phone]  Physical Therapy Treatment  Patient Details  Name: Sarah Cooper MRN: [id number] Date of Birth: 09/20/1951 Referring Provider (PT): Melrose Nakayama    Encounter Date: 08/03/2020   PT End of Session - 08/03/20 1531    Visit Number 3    Number of Visits 12    Date for PT Re-Evaluation 09/07/20    Authorization Type UHC Medicaree    Progress Note Due on Visit 10    PT Start Time 1448    PT Stop Time 1528    PT Time Calculation (min) 40 min    Activity Tolerance Patient tolerated treatment well    Behavior During Therapy [facility] for tasks assessed/performed           Past Medical History:  Diagnosis Date  . Anxiety   . Arthritis   . Cancer ([place]) 1978   cervical  . Depression   . Hypertension   . Pneumothorax 1977    Past Surgical History:  Procedure Laterality Date  . BREAST BIOPSY Left    approx @ 48 benign   . HIP SURGERY  2007   right  . JOINT REPLACEMENT  2007  . TOTAL HIP ARTHROPLASTY Left 07/25/2020   Procedure: LEFT TOTAL HIP ARTHROPLASTY ANTERIOR APPROACH;  Surgeon: Melrose Nakayama, MD;  Location: [facility];  Service: Orthopedics;  Laterality: Left;    There were no vitals filed for this visit.   Subjective Assessment - 08/03/20 1453    Subjective Reports pain with movement,  numbness and burning to the touch.  Pt brought 2 SPC with her this session, asked to be adjusted.    Pertinent History OA, HTN, Rt THR    Patient Stated Goals To walk without an assistive device, to go back to water aerobics, to go to [place]    Currently in Pain? Yes    Pain Score 4     Pain Location Hip    Pain Orientation Left    Pain Descriptors / Indicators Burning;Sore    Pain Type Surgical pain    Pain Onset In the past 7 days    Pain Frequency Constant    Aggravating Factors  exercise, activity    Pain Relieving Factors ice, meds     Effect of Pain on Daily Activities limits                             [facility] Adult PT Treatment/Exercise - 08/03/20 0001      Knee/Hip Exercises: Standing   Heel Raises Both;10 reps    Lateral Step Up Left;10 reps;Hand Hold: 2;Step Height: 4"    Forward Step Up Left;10 reps;Hand Hold: 2;Step Height: 4"    Step Down Left;10 reps;Hand Hold: 2;Step Height: 4"    Functional Squat 10 reps    Gait Training 276ft with SPC good mechanics      Knee/Hip Exercises: Seated   Sit to Sand 10 reps;without UE support      Knee/Hip Exercises: Supine   Bridges Both;2 sets;5 reps    Straight Leg Raises 2 sets;5 reps      Knee/Hip Exercises: Sidelying   Clams 2x 5                    PT Short Term Goals - 08/01/20 1438      PT SHORT  TERM GOAL #1   Title PT to be I in HEP to improve her functional ability    Time 2    Period Weeks    Status On-going    Target Date 08/17/20      PT SHORT TERM GOAL #2   Title PT hip strength to improve to allow pt to I place her LE on and off her bed    Time 2    Period Weeks    Status On-going      PT SHORT TERM GOAL #3   Title PT to be walking with her cane both in and out of the home.    Status On-going             PT Long Term Goals - 08/01/20 1440      PT LONG TERM GOAL #1   Title Pt to be I in advance HEP to allow pt to improve her functional ability    Time 6    Period Weeks    Status On-going      PT LONG TERM GOAL #2   Title PT to be able to come sit to stand without UE 8 or more times in a 30" period.    Time 6    Period Weeks    Status On-going      PT LONG TERM GOAL #3   Title PT strength in her LT hip and core to be increased to be able to go up and down 4 steps in a reciprocal manner using a handrail    Time 6    Period Weeks    Status On-going      PT LONG TERM GOAL #4   Title PT to be able to single leg stance for at least 20seconds on both LE to allow pt to feel confident walking without any  assistive device.    Time 6    Period Weeks    Status On-going                 Plan - 08/03/20 1511    Clinical Impression Statement Pt brought in personal SPCs.  Therapist adjusted cane to proper height, pt able to demonstrate good mechanics with no LOB episodes.  Encouraged pt to continue with RW for long duration or uneven terrain but SPC inside home.  Progressed LE strengthening with advanced HEP given.  Pt able to demonstrate appropriate form and technqiue with exercises though demonstrates proximal weakness especially with clam and SLR, cueing required to address extension lag during SLR and reduce leaning back.  Progressed to step up training, good control with 4in step height, did require HHA.    Personal Factors and Comorbidities Comorbidity 2    Comorbidities OA, HTN, back pain    Examination-Activity Limitations Bathing;Bed Mobility;Carry;Dressing;Lift;Locomotion Level;Squat;Stairs;Stand    Examination-Participation Restrictions Cleaning;Driving;Laundry;Meal Prep;Shop;Yard Work    Stability/Clinical Decision Making Stable/Uncomplicated    Clinical Decision Making Low    Rehab Potential Good    PT Frequency 2x / week    PT Duration 6 weeks    PT Treatment/Interventions ADLs/Self Care Home Management;Patient/family education;Gait training;Stair training;Therapeutic activities;Functional mobility training;Therapeutic exercise;Balance training;Manual techniques;Passive range of motion    PT Next Visit Plan Next session begin SLS, tandem stance and sidestep.    PT Home Exercise Plan sitting LAQ, ankle pumps, heelslides, quad and glut sets hip adduction; 08/03/20: standing heelraises, minisquats, sit to stands, clams, SLR and bridges           Patient  will benefit from skilled therapeutic intervention in order to improve the following deficits and impairments:  Abnormal gait, Decreased activity tolerance, Decreased balance, Decreased mobility, Decreased range of motion,  Difficulty walking, Decreased strength, Increased edema, Pain  Visit Diagnosis: Other abnormalities of gait and mobility  Localized edema  Difficulty in walking, not elsewhere classified  Pain in left hip     Problem List Patient Active Problem List   Diagnosis Date Noted  . Primary localized osteoarthritis of left hip 07/25/2020  . Tubular adenoma of colon 12/11/2016  . Dyspnea 12/11/2016  . Vitamin D deficiency 11/21/2016  . Pre-diabetes 11/21/2016  . Essential hypertension 11/19/2016  . Depression, recurrent ([place]) 11/19/2016  . Osteoarthritis 11/19/2016  . Chronic back pain greater than 3 months duration 11/19/2016  . History of cervical dysplasia 11/19/2016  . Morbid obesity ([place]) 11/19/2016   Ihor Austin, LPTA/CLT; CBIS [id number]  Aldona Lento 08/03/2020, 3:33 PM  [place] [address] [place], [place], [address] Phone: [phone]   Fax:  [phone]  Name: Sarah Cooper MRN: [id number] Date of Birth: Aug 27, 1951

## 2020-08-04 DIAGNOSIS — Z9889 Other specified postprocedural states: Secondary | ICD-10-CM | POA: Diagnosis not present

## 2020-08-07 ENCOUNTER — Ambulatory Visit (HOSPITAL_COMMUNITY): Payer: Medicare Other | Admitting: Physical Therapy

## 2020-08-07 ENCOUNTER — Telehealth (HOSPITAL_COMMUNITY): Payer: Self-pay | Admitting: Physical Therapy

## 2020-08-07 NOTE — Telephone Encounter (Signed)
pt called to cx this appt due to she is having a shower put together in her bathroom

## 2020-08-09 ENCOUNTER — Other Ambulatory Visit: Payer: Self-pay

## 2020-08-09 ENCOUNTER — Encounter (HOSPITAL_COMMUNITY): Payer: Self-pay | Admitting: Physical Therapy

## 2020-08-09 ENCOUNTER — Ambulatory Visit (HOSPITAL_COMMUNITY): Payer: Medicare Other | Admitting: Physical Therapy

## 2020-08-09 DIAGNOSIS — R2689 Other abnormalities of gait and mobility: Secondary | ICD-10-CM | POA: Diagnosis not present

## 2020-08-09 DIAGNOSIS — R600 Localized edema: Secondary | ICD-10-CM | POA: Diagnosis not present

## 2020-08-09 DIAGNOSIS — M25552 Pain in left hip: Secondary | ICD-10-CM

## 2020-08-09 DIAGNOSIS — R262 Difficulty in walking, not elsewhere classified: Secondary | ICD-10-CM | POA: Diagnosis not present

## 2020-08-09 NOTE — Therapy (Signed)
[place], [place], [address] Phone: [phone]   Fax:  [phone]  Physical Therapy Treatment  Patient Details  Name: Sarah Cooper MRN: [id number] Date of Birth: Sep 02, 1951 Referring Provider (PT): Melrose Nakayama    Encounter Date: 08/09/2020   PT End of Session - 08/09/20 1207    Visit Number 4    Number of Visits 12    Date for PT Re-Evaluation 09/07/20    Authorization Type UHC Medicaree    Progress Note Due on Visit 10    PT Start Time 1135    PT Stop Time 1213    PT Time Calculation (min) 38 min    Activity Tolerance Patient tolerated treatment well    Behavior During Therapy [facility] for tasks assessed/performed           Past Medical History:  Diagnosis Date  . Anxiety   . Arthritis   . Cancer ([place]) 1978   cervical  . Depression   . Hypertension   . Pneumothorax 1977    Past Surgical History:  Procedure Laterality Date  . BREAST BIOPSY Left    approx @ 48 benign   . HIP SURGERY  2007   right  . JOINT REPLACEMENT  2007  . TOTAL HIP ARTHROPLASTY Left 07/25/2020   Procedure: LEFT TOTAL HIP ARTHROPLASTY ANTERIOR APPROACH;  Surgeon: Melrose Nakayama, MD;  Location: [facility];  Service: Orthopedics;  Laterality: Left;    There were no vitals filed for this visit.   Subjective Assessment - 08/09/20 1136    Subjective Pt is no longer taking any pain medication.  She is trying to get the exerices done. She is cooking and doing some housework.    Pertinent History OA, HTN, Rt THR    How long can you sit comfortably? Able to sit for an hour at a time.    How long can you stand comfortably? Able to stand for 10-15 minutes priot to discomfort; 10/27 20 minutes    How long can you walk comfortably? Able to walk for about 15 minutes with a walker.; walking with a cane    Pain Onset In the past 7 days                     [facility] Adult PT Treatment/Exercise - 08/09/20 0001       Ambulation/Gait   Ambulation/Gait Yes    Ambulation/Gait Assistance 7: Independent    Ambulation Distance (Feet) 226 Feet    Assistive device None      Exercises   Exercises Knee/Hip      Knee/Hip Exercises: Stretches   Other Knee/Hip Stretches slant board 3 x 30"      Knee/Hip Exercises: Standing   Heel Raises 15 reps    Lateral Step Up Left;10 reps;Step Height: 6"    Forward Step Up Left;10 reps;Step Height: 6"    Step Down Left;10 reps;Step Height: 4"    Rocker Board 2 minutes    SLS with Vectors 3 x each ; finger touch     Other Standing Knee Exercises side stepping x 1 RT       Knee/Hip Exercises: Seated   Sit to Sand 15 reps                    PT Short Term Goals - 08/01/20 1438      PT SHORT TERM GOAL #1   Title PT to be I in  HEP to improve her functional ability    Time 2    Period Weeks    Status On-going    Target Date 08/17/20      PT SHORT TERM GOAL #2   Title PT hip strength to improve to allow pt to I place her LE on and off her bed    Time 2    Period Weeks    Status On-going      PT SHORT TERM GOAL #3   Title PT to be walking with her cane both in and out of the home.    Status On-going             PT Long Term Goals - 08/01/20 1440      PT LONG TERM GOAL #1   Title Pt to be I in advance HEP to allow pt to improve her functional ability    Time 6    Period Weeks    Status On-going      PT LONG TERM GOAL #2   Title PT to be able to come sit to stand without UE 8 or more times in a 30" period.    Time 6    Period Weeks    Status On-going      PT LONG TERM GOAL #3   Title PT strength in her LT hip and core to be increased to be able to go up and down 4 steps in a reciprocal manner using a handrail    Time 6    Period Weeks    Status On-going      PT LONG TERM GOAL #4   Title PT to be able to single leg stance for at least 20seconds on both LE to allow pt to feel confident walking without any assistive device.    Time 6     Period Weeks    Status On-going                 Plan - 08/09/20 1208    Clinical Impression Statement Progressed POC with ambulating without an assistive device, increasing step height and adding lunging and sidestepping activity.    Personal Factors and Comorbidities Comorbidity 2    Comorbidities OA, HTN, back pain    Examination-Activity Limitations Bathing;Bed Mobility;Carry;Dressing;Lift;Locomotion Level;Squat;Stairs;Stand    Examination-Participation Restrictions Cleaning;Driving;Laundry;Meal Prep;Shop;Yard Work    Stability/Clinical Decision Making Stable/Uncomplicated    Rehab Potential Good    PT Frequency 2x / week    PT Duration 6 weeks    PT Treatment/Interventions ADLs/Self Care Home Management;Patient/family education;Gait training;Stair training;Therapeutic activities;Functional mobility training;Therapeutic exercise;Balance training;Manual techniques;Passive range of motion    PT Next Visit Plan Begin steps when able. Next visit add step ups and single leg stance for a HEP    PT Home Exercise Plan sitting LAQ, ankle pumps, heelslides, quad and glut sets hip adduction; 08/03/20: standing heelraises, minisquats, sit to stands, clams, SLR and bridges           Patient will benefit from skilled therapeutic intervention in order to improve the following deficits and impairments:  Abnormal gait, Decreased activity tolerance, Decreased balance, Decreased mobility, Decreased range of motion, Difficulty walking, Decreased strength, Increased edema, Pain  Visit Diagnosis: Other abnormalities of gait and mobility  Localized edema  Difficulty in walking, not elsewhere classified  Pain in left hip     Problem List Patient Active Problem List   Diagnosis Date Noted  . Primary localized osteoarthritis of left hip 07/25/2020  . Tubular  adenoma of colon 12/11/2016  . Dyspnea 12/11/2016  . Vitamin D deficiency 11/21/2016  . Pre-diabetes 11/21/2016  . Essential  hypertension 11/19/2016  . Depression, recurrent ([place]) 11/19/2016  . Osteoarthritis 11/19/2016  . Chronic back pain greater than 3 months duration 11/19/2016  . History of cervical dysplasia 11/19/2016  . Morbid obesity [facility]) 11/19/2016   Rayetta Humphrey, PT CLT [phone] 08/09/2020, 12:15 PM  [place] [address] [place], [place], [address] Phone: [phone]   Fax:  [phone]  Name: VELVIE THOMASTON MRN: [id number] Date of Birth: 05-17-1951

## 2020-08-15 ENCOUNTER — Encounter (HOSPITAL_COMMUNITY): Payer: Self-pay | Admitting: Physical Therapy

## 2020-08-15 ENCOUNTER — Other Ambulatory Visit: Payer: Self-pay

## 2020-08-15 ENCOUNTER — Ambulatory Visit (HOSPITAL_COMMUNITY): Payer: Medicare Other | Attending: Orthopaedic Surgery | Admitting: Physical Therapy

## 2020-08-15 DIAGNOSIS — M25552 Pain in left hip: Secondary | ICD-10-CM | POA: Insufficient documentation

## 2020-08-15 DIAGNOSIS — R262 Difficulty in walking, not elsewhere classified: Secondary | ICD-10-CM | POA: Insufficient documentation

## 2020-08-15 DIAGNOSIS — R600 Localized edema: Secondary | ICD-10-CM | POA: Diagnosis not present

## 2020-08-15 DIAGNOSIS — R2689 Other abnormalities of gait and mobility: Secondary | ICD-10-CM | POA: Diagnosis not present

## 2020-08-15 NOTE — Therapy (Signed)
[place], [place], [address] Phone: [phone]   Fax:  [phone]  Physical Therapy Treatment  Patient Details  Name: Sarah Cooper MRN: [id number] Date of Birth: [birth year] Referring Provider (PT): Melrose Nakayama    Encounter Date: 08/15/2020   PT End of Session - 08/15/20 1315    Visit Number 5    Number of Visits 12    Date for PT Re-Evaluation 09/07/20    Authorization Type UHC Medicaree    Progress Note Due on Visit 10    PT Start Time 1315    PT Stop Time 1355    PT Time Calculation (min) 40 min    Activity Tolerance Patient tolerated treatment well    Behavior During Therapy [facility] for tasks assessed/performed           Past Medical History:  Diagnosis Date  . Anxiety   . Arthritis   . Cancer ([place]) 1978   cervical  . Depression   . Hypertension   . Pneumothorax 1977    Past Surgical History:  Procedure Laterality Date  . BREAST BIOPSY Left    approx @ 48 benign   . HIP SURGERY  2007   right  . JOINT REPLACEMENT  2007  . TOTAL HIP ARTHROPLASTY Left 07/25/2020   Procedure: LEFT TOTAL HIP ARTHROPLASTY ANTERIOR APPROACH;  Surgeon: Melrose Nakayama, MD;  [facility];  Service: Orthopedics;  Laterality: Left;    There were no vitals filed for this visit.       [facility] PT Assessment - 08/15/20 0001      Assessment   Medical Diagnosis Lt THR     Referring Provider (PT) Melrose Nakayama     Onset Date/Surgical Date 07/25/20                         [facility] Adult PT Treatment/Exercise - 08/15/20 0001      Knee/Hip Exercises: Stretches   Gastroc Stretch Left;3 reps;30 seconds   incline board.      Knee/Hip Exercises: Seated   Sit to Sand 15 reps;without UE support      Knee/Hip Exercises: Supine   Quad Sets --   reviewed   Heel Slides --   reviewed   Bridges AROM;4 sets;5 reps    Straight Leg Raises 5 reps;Left;Other (comment)   painful stopped    Other Supine  Knee/Hip Exercises heel slides x10 L 5" holds ; hook lying hip abd unilateral into Red theraband. x25 B     Other Supine Knee/Hip Exercises hip add isometric towel 2x5 5" holds ; butterfly stretch x20 5" holds                   PT Education - 08/15/20 1341    Education Details Answered all questions about HEP, consolidated HEP and reviewed program. educated patient in staying within tolerated ROM    Person(s) Educated Patient    Methods Explanation    Comprehension Verbalized understanding            PT Short Term Goals - 08/01/20 1438      PT SHORT TERM GOAL #1   Title PT to be I in HEP to improve her functional ability    Time 2    Period Weeks    Status On-going    Target Date 08/17/20      PT SHORT TERM GOAL #2   Title PT hip strength  to improve to allow pt to I place her LE on and off her bed    Time 2    Period Weeks    Status On-going      PT SHORT TERM GOAL #3   Title PT to be walking with her cane both in and out of the home.    Status On-going             PT Long Term Goals - 08/01/20 1440      PT LONG TERM GOAL #1   Title Pt to be I in advance HEP to allow pt to improve her functional ability    Time 6    Period Weeks    Status On-going      PT LONG TERM GOAL #2   Title PT to be able to come sit to stand without UE 8 or more times in a 30" period.    Time 6    Period Weeks    Status On-going      PT LONG TERM GOAL #3   Title PT strength in her LT hip and core to be increased to be able to go up and down 4 steps in a reciprocal manner using a handrail    Time 6    Period Weeks    Status On-going      PT LONG TERM GOAL #4   Title PT to be able to single leg stance for at least 20seconds on both LE to allow pt to feel confident walking without any assistive device.    Time 6    Period Weeks    Status On-going                 Plan - 08/15/20 1400    Clinical Impression Statement Cramps in both legs and core muscles throughout  session. Patient reported she has had increased cramping since the surgery and this may be related to hydration. Reviewed HEP secondary to patient request. Clamshells very challenging for patient, changed this exercise to hooklying clamshells with a band which was still challenging but not as painful. Will continue to progress patient as tolerated.    Personal Factors and Comorbidities Comorbidity 2    Comorbidities OA, HTN, back pain    Examination-Activity Limitations Bathing;Bed Mobility;Carry;Dressing;Lift;Locomotion Level;Squat;Stairs;Stand    Examination-Participation Restrictions Cleaning;Driving;Laundry;Meal Prep;Shop;Yard Work    Stability/Clinical Decision Making Stable/Uncomplicated    Rehab Potential Good    PT Frequency 2x / week    PT Duration 6 weeks    PT Treatment/Interventions ADLs/Self Care Home Management;Patient/family education;Gait training;Stair training;Therapeutic activities;Functional mobility training;Therapeutic exercise;Balance training;Manual techniques;Passive range of motion    PT Next Visit Plan Begin steps when able. Next visit add step ups and single leg stance for a HEP    PT Home Exercise Plan sitting LAQ, ankle pumps, heelslides, quad and glut sets hip adduction; 08/03/20: standing heelraises, minisquats, sit to stands, clams, SLR and bridges           Patient will benefit from skilled therapeutic intervention in order to improve the following deficits and impairments:  Abnormal gait, Decreased activity tolerance, Decreased balance, Decreased mobility, Decreased range of motion, Difficulty walking, Decreased strength, Increased edema, Pain  Visit Diagnosis: Other abnormalities of gait and mobility  Localized edema  Difficulty in walking, not elsewhere classified  Pain in left hip     Problem List Patient Active Problem List   Diagnosis Date Noted  . Primary localized osteoarthritis of left hip 07/25/2020  .  Tubular adenoma of colon 12/11/2016    . Dyspnea 12/11/2016  . Vitamin D deficiency 11/21/2016  . Pre-diabetes 11/21/2016  . Essential hypertension 11/19/2016  . Depression, recurrent ([place]) 11/19/2016  . Osteoarthritis 11/19/2016  . Chronic back pain greater than 3 months duration 11/19/2016  . History of cervical dysplasia 11/19/2016  . Morbid obesity ([place]) 11/19/2016   2:01 PM, 08/15/20 Jerene Pitch, DPT Physical Therapy with [facility]  [phone] office  [place] [address] [place], [place], [address] Phone: [phone]   Fax:  [phone]  Name: Sarah Cooper MRN: [id number] Date of Birth: 1951-02-22

## 2020-08-15 NOTE — Patient Instructions (Signed)
Access Code: [email or web address] URL: [email or web address] Date: 08/15/2020 Prepared by: Yetta Glassman  Exercises Supine Heel Slide - 1 x daily - 5 x weekly - 2 sets - 15 reps - 5 hold Supine Gluteal Sets - 1 x daily - 5 x weekly - 2 sets - 15 reps - 5 hold Supine Hip Adduction Isometric with Ball - 1 x daily - 5 x weekly - 2 sets - 15 reps - 5 hold Supine Bridge - 1 x daily - 5 x weekly - 4 sets - 5 reps - 5 hold Hooklying Isometric Clamshell - 1 x daily - 5 x weekly - 5 sets - 5 reps - 2 hold Small Range Straight Leg Raise - 1 x daily - 5 x weekly - 4 sets - 5 reps Sit to Stand - 1 x daily - 7 x weekly - 3 sets - 10 reps Bent Knee Fallouts - 1 x daily - 7 x weekly - 1 sets - 20 reps Supine Hip Abduction - 1 x daily - 7 x weekly - 2 sets - 10 reps

## 2020-08-17 ENCOUNTER — Telehealth (HOSPITAL_COMMUNITY): Payer: Self-pay

## 2020-08-17 ENCOUNTER — Ambulatory Visit (HOSPITAL_COMMUNITY): Payer: Medicare Other

## 2020-08-17 NOTE — Telephone Encounter (Signed)
pt called to cancel this appt [email or web address]

## 2020-08-22 ENCOUNTER — Encounter (HOSPITAL_COMMUNITY): Payer: Self-pay

## 2020-08-22 ENCOUNTER — Other Ambulatory Visit: Payer: Self-pay

## 2020-08-22 ENCOUNTER — Ambulatory Visit (HOSPITAL_COMMUNITY): Payer: Medicare Other

## 2020-08-22 DIAGNOSIS — R600 Localized edema: Secondary | ICD-10-CM | POA: Diagnosis not present

## 2020-08-22 DIAGNOSIS — M25552 Pain in left hip: Secondary | ICD-10-CM | POA: Diagnosis not present

## 2020-08-22 DIAGNOSIS — R262 Difficulty in walking, not elsewhere classified: Secondary | ICD-10-CM

## 2020-08-22 DIAGNOSIS — R2689 Other abnormalities of gait and mobility: Secondary | ICD-10-CM | POA: Diagnosis not present

## 2020-08-22 NOTE — Therapy (Signed)
[place], [place], [address] Phone: [phone]   Fax:  [phone]  Physical Therapy Treatment  Patient Details  Name: Sarah Cooper MRN: [id number] Date of Birth: [birth year] Referring Provider (PT): Melrose Nakayama    Encounter Date: 08/22/2020   PT End of Session - 08/22/20 1136    Visit Number 6    Number of Visits 12    Date for PT Re-Evaluation 09/07/20    Authorization Type UHC Medicaree    Progress Note Due on Visit 10    PT Start Time 1134    PT Stop Time 1217    PT Time Calculation (min) 43 min    Activity Tolerance Patient tolerated treatment well    Behavior During Therapy [facility] for tasks assessed/performed           Past Medical History:  Diagnosis Date  . Anxiety   . Arthritis   . Cancer ([place]) 1978   cervical  . Depression   . Hypertension   . Pneumothorax 1977    Past Surgical History:  Procedure Laterality Date  . BREAST BIOPSY Left    approx @ 48 benign   . HIP SURGERY  2007   right  . JOINT REPLACEMENT  2007  . TOTAL HIP ARTHROPLASTY Left 07/25/2020   Procedure: LEFT TOTAL HIP ARTHROPLASTY ANTERIOR APPROACH;  Surgeon: Melrose Nakayama, MD;  Location: [facility];  Service: Orthopedics;  Laterality: Left;    There were no vitals filed for this visit.   Subjective Assessment - 08/22/20 1134    Subjective Pt stated she was able to tie Lt tennis shoes and took a standing shower independently today, difficult due to stiffness.  Admits she hasn't been doing her HEP regulary.    Pertinent History OA, HTN, Rt THR    Patient Stated Goals To walk without an assistive device, to go back to water aerobics, to go to [place]    Currently in Pain? No/denies              [facility] PT Assessment - 08/22/20 0001      Assessment   Medical Diagnosis Lt THR     Referring Provider (PT) Melrose Nakayama     Onset Date/Surgical Date 07/25/20    Next MD Visit 08/04/2020    Prior Therapy  hospital       Precautions   Precautions Anterior Hip                         [facility] Adult PT Treatment/Exercise - 08/22/20 0001      Exercises   Exercises Knee/Hip      Knee/Hip Exercises: Standing   Heel Raises 15 reps    Lateral Step Up 10 reps;Hand Hold: 1;Hand Hold: 2    Lateral Step Up Limitations 7in step height    Functional Squat 10 reps    Functional Squat Limitations 3D hip excursion    Stairs 5RT reciprocal pattern     SLS 3x each Rt: 9" Lt 10"    SLS with Vectors 3x 5" BLE    Rebounder --    Other Standing Knee Exercises side stepping x 1 RT       Knee/Hip Exercises: Seated   Other Seated Knee/Hip Exercises Lt IR 10x 5"    Sit to Sand 10 reps;without UE support   controlled decent     Knee/Hip Exercises: Supine   Bridges Both;10 reps  Bridges Limitations 5" holds    Other Supine Knee/Hip Exercises clam 10x5" with RTB      Knee/Hip Exercises: Sidelying   Clams 2x 5                    PT Short Term Goals - 08/01/20 1438      PT SHORT TERM GOAL #1   Title PT to be I in HEP to improve her functional ability    Time 2    Period Weeks    Status On-going    Target Date 08/17/20      PT SHORT TERM GOAL #2   Title PT hip strength to improve to allow pt to I place her LE on and off her bed    Time 2    Period Weeks    Status On-going      PT SHORT TERM GOAL #3   Title PT to be walking with her cane both in and out of the home.    Status On-going             PT Long Term Goals - 08/01/20 1440      PT LONG TERM GOAL #1   Title Pt to be I in advance HEP to allow pt to improve her functional ability    Time 6    Period Weeks    Status On-going      PT LONG TERM GOAL #2   Title PT to be able to come sit to stand without UE 8 or more times in a 30" period.    Time 6    Period Weeks    Status On-going      PT LONG TERM GOAL #3   Title PT strength in her LT hip and core to be increased to be able to go up and down 4  steps in a reciprocal manner using a handrail    Time 6    Period Weeks    Status On-going      PT LONG TERM GOAL #4   Title PT to be able to single leg stance for at least 20seconds on both LE to allow pt to feel confident walking without any assistive device.    Time 6    Period Weeks    Status On-going                 Plan - 08/22/20 1249    Clinical Impression Statement Added 3D hip excursion for mobility and progressed functional strengthening.  Began reciprocal pattern 7in step height with 1 HR assistance, pt able to demonstrate good and safe mechanics with increased demand.  Pt continues to demonstrate gluteal weakness noted with SLS and increased challenge wiht clams.  Reviewed HEP compliance wiht additional SLS and beginning walking program for activity tolerance, printout given and verbalized understanding.    Personal Factors and Comorbidities Comorbidity 2    Comorbidities OA, HTN, back pain    Examination-Activity Limitations Bathing;Bed Mobility;Carry;Dressing;Lift;Locomotion Level;Squat;Stairs;Stand    Examination-Participation Restrictions Cleaning;Driving;Laundry;Meal Prep;Shop;Yard Work    Stability/Clinical Decision Making Stable/Uncomplicated    Clinical Decision Making Low    Rehab Potential Good    PT Frequency 2x / week    PT Duration 6 weeks    PT Treatment/Interventions ADLs/Self Care Home Management;Patient/family education;Gait training;Stair training;Therapeutic activities;Functional mobility training;Therapeutic exercise;Balance training;Manual techniques;Passive range of motion    PT Next Visit Plan Progress gluteal strengthening.  Begin sidelunges, add resistance with sidestep as able.  Continue SLS,  vector stance....    PT Home Exercise Plan sitting LAQ, ankle pumps, heelslides, quad and glut sets hip adduction; 08/03/20: standing heelraises, minisquats, sit to stands, clams, SLR and bridges; 08/22/20: begin walking program with SPC and SLS infront of  counter for safety.           Patient will benefit from skilled therapeutic intervention in order to improve the following deficits and impairments:  Abnormal gait, Decreased activity tolerance, Decreased balance, Decreased mobility, Decreased range of motion, Difficulty walking, Decreased strength, Increased edema, Pain  Visit Diagnosis: Other abnormalities of gait and mobility  Localized edema  Difficulty in walking, not elsewhere classified  Pain in left hip     Problem List Patient Active Problem List   Diagnosis Date Noted  . Primary localized osteoarthritis of left hip 07/25/2020  . Tubular adenoma of colon 12/11/2016  . Dyspnea 12/11/2016  . Vitamin D deficiency 11/21/2016  . Pre-diabetes 11/21/2016  . Essential hypertension 11/19/2016  . Depression, recurrent ([place]) 11/19/2016  . Osteoarthritis 11/19/2016  . Chronic back pain greater than 3 months duration 11/19/2016  . History of cervical dysplasia 11/19/2016  . Morbid obesity ([place]) 11/19/2016   Ihor Austin, LPTA/CLT; CBIS [phone]  Aldona Lento 08/22/2020, 12:56 PM  [facility] [address] [place], [place], [address] Phone: [phone]   Fax:  [phone]  Name: Sarah Cooper MRN: [id number] Date of Birth: [birth year]

## 2020-08-22 NOTE — Patient Instructions (Signed)
Single Leg Balance: Eyes Open    Stand on right leg with eyes open. Hold 30 seconds. 5 reps 2 times per day.  http://ggbe.exer.us/5   Copyright  VHI. All rights reserved.   Begin walking program  Set timer on watch and begin walking for 5-10 minutes at a time.  Increase walking time as able.  4 days a week.

## 2020-08-24 ENCOUNTER — Ambulatory Visit (HOSPITAL_COMMUNITY): Payer: Medicare Other | Admitting: Physical Therapy

## 2020-08-24 ENCOUNTER — Other Ambulatory Visit: Payer: Self-pay

## 2020-08-24 ENCOUNTER — Encounter (HOSPITAL_COMMUNITY): Payer: Self-pay | Admitting: Physical Therapy

## 2020-08-24 DIAGNOSIS — E7849 Other hyperlipidemia: Secondary | ICD-10-CM | POA: Diagnosis not present

## 2020-08-24 DIAGNOSIS — R2689 Other abnormalities of gait and mobility: Secondary | ICD-10-CM | POA: Diagnosis not present

## 2020-08-24 DIAGNOSIS — M25552 Pain in left hip: Secondary | ICD-10-CM

## 2020-08-24 DIAGNOSIS — R600 Localized edema: Secondary | ICD-10-CM

## 2020-08-24 DIAGNOSIS — R262 Difficulty in walking, not elsewhere classified: Secondary | ICD-10-CM | POA: Diagnosis not present

## 2020-08-24 DIAGNOSIS — I10 Essential (primary) hypertension: Secondary | ICD-10-CM | POA: Diagnosis not present

## 2020-08-24 NOTE — Therapy (Signed)
[facility] [address] [place], [place], [address] Phone: [phone]   Fax:  [phone]  Physical Therapy Treatment  Patient Details  Name: Sarah Cooper MRN: [id number] Date of Birth: 03-27-1951 Referring Provider (PT): Melrose Nakayama    Encounter Date: 08/24/2020   PT End of Session - 08/24/20 1205    Visit Number 7    Number of Visits 12    Date for PT Re-Evaluation 09/07/20    Authorization Type UHC [place]    Progress Note Due on Visit 10    PT Start Time 1138    PT Stop Time 1216    PT Time Calculation (min) 38 min    Activity Tolerance Patient tolerated treatment well    Behavior During Therapy [facility] for tasks assessed/performed           Past Medical History:  Diagnosis Date   Anxiety    Arthritis    Cancer ([place]) 1978   cervical   Depression    Hypertension    Pneumothorax 1977    Past Surgical History:  Procedure Laterality Date   BREAST BIOPSY Left    approx @ 48 benign    HIP SURGERY  2007   right   JOINT REPLACEMENT  2007   TOTAL HIP ARTHROPLASTY Left 07/25/2020   Procedure: LEFT TOTAL HIP ARTHROPLASTY ANTERIOR APPROACH;  Surgeon: Melrose Nakayama, MD;  Location: [facility];  Service: Orthopedics;  Laterality: Left;    There were no vitals filed for this visit.   Subjective Assessment - 08/24/20 1138    Subjective Pt stated she was able to tie Lt tennis shoes and took a standing shower independently today, difficult due to stiffness.  Admits she hasn't been doing her HEP regulary.    Pertinent History OA, HTN, Rt THR    Patient Stated Goals To walk without an assistive device, to go back to water aerobics, to go to [place]    Currently in Pain? No/denies                             [facility] Adult PT Treatment/Exercise - 08/24/20 0001      Exercises   Exercises Knee/Hip               Balance Exercises - 08/24/20 0001      Balance Exercises: Standing    Tandem Stance Eyes open;5 reps   raising cane into flexion     SLS with Vectors 3 reps;15 secs    Rockerboard Lateral   x 2'   Sidestepping 2 reps;Theraband    Theraband Level (Sidestepping) Level 2 (Red)    Marching Solid surface;Static;10 reps    Heel Raises Both;10 reps   no UE    Sit to Stand Standard surface    Other Standing Exercises  step up onto 6" box x 10 B, lunging B onto 6" step x 10     Other Standing Exercises Comments slant board stretch 3x x 30"               PT Short Term Goals - 08/01/20 1438      PT SHORT TERM GOAL #1   Title PT to be I in HEP to improve her functional ability    Time 2    Period Weeks    Status On-going    Target Date 08/17/20      PT SHORT TERM GOAL #2  Title PT hip strength to improve to allow pt to I place her LE on and off her bed    Time 2    Period Weeks    Status On-going      PT SHORT TERM GOAL #3   Title PT to be walking with her cane both in and out of the home.    Status On-going             PT Long Term Goals - 08/01/20 1440      PT LONG TERM GOAL #1   Title Pt to be I in advance HEP to allow pt to improve her functional ability    Time 6    Period Weeks    Status On-going      PT LONG TERM GOAL #2   Title PT to be able to come sit to stand without UE 8 or more times in a 30" period.    Time 6    Period Weeks    Status On-going      PT LONG TERM GOAL #3   Title PT strength in her LT hip and core to be increased to be able to go up and down 4 steps in a reciprocal manner using a handrail    Time 6    Period Weeks    Status On-going      PT LONG TERM GOAL #4   Title PT to be able to single leg stance for at least 20seconds on both LE to allow pt to feel confident walking without any assistive device.    Time 6    Period Weeks    Status On-going                 Plan - 08/24/20 1205    Clinical Impression Statement Progressed balance activity with marching and vector stances.  Continued to  improve mobility with slant board stretch.  Pt walking at home without assistive device.    Personal Factors and Comorbidities Comorbidity 2    Comorbidities OA, HTN, back pain    Examination-Activity Limitations Bathing;Bed Mobility;Carry;Dressing;Lift;Locomotion Level;Squat;Stairs;Stand    Examination-Participation Restrictions Cleaning;Driving;Laundry;Meal Prep;Shop;Yard Work    Stability/Clinical Decision Making Stable/Uncomplicated    Rehab Potential Good    PT Frequency 2x / week    PT Duration 6 weeks    PT Treatment/Interventions ADLs/Self Care Home Management;Patient/family education;Gait training;Stair training;Therapeutic activities;Functional mobility training;Therapeutic exercise;Balance training;Manual techniques;Passive range of motion    PT Next Visit Plan Begin sidelunges, begin stair climbing    PT Home Exercise Plan sitting LAQ, ankle pumps, heelslides, quad and glut sets hip adduction; 08/03/20: standing heelraises, minisquats, sit to stands, clams, SLR and bridges; 08/22/20: begin walking program with SPC and SLS infront of counter for safety.11/11:  lunge and sidestep           Patient will benefit from skilled therapeutic intervention in order to improve the following deficits and impairments:  Abnormal gait, Decreased activity tolerance, Decreased balance, Decreased mobility, Decreased range of motion, Difficulty walking, Decreased strength, Increased edema, Pain  Visit Diagnosis: Other abnormalities of gait and mobility  Localized edema  Difficulty in walking, not elsewhere classified  Pain in left hip     Problem List Patient Active Problem List   Diagnosis Date Noted   Primary localized osteoarthritis of left hip 07/25/2020   Tubular adenoma of colon 12/11/2016   Dyspnea 12/11/2016   Vitamin D deficiency 11/21/2016   Pre-diabetes 11/21/2016   Essential hypertension 11/19/2016  Depression, recurrent ([place]) 11/19/2016   Osteoarthritis  11/19/2016   Chronic back pain greater than 3 months duration 11/19/2016   History of cervical dysplasia 11/19/2016   Morbid obesity ([place]) 11/19/2016    Rayetta Humphrey, PT CLT [phone] 08/24/2020, 12:21 PM  [facility] [address] [place], [place], [address] Phone: [phone]   Fax:  [phone]  Name: Sarah Cooper MRN: [id number] Date of Birth: 1951-06-09

## 2020-08-29 ENCOUNTER — Ambulatory Visit (HOSPITAL_COMMUNITY): Payer: Medicare Other | Admitting: Physical Therapy

## 2020-08-29 ENCOUNTER — Encounter (HOSPITAL_COMMUNITY): Payer: Self-pay | Admitting: Physical Therapy

## 2020-08-29 ENCOUNTER — Other Ambulatory Visit: Payer: Self-pay

## 2020-08-29 ENCOUNTER — Other Ambulatory Visit: Payer: Medicare Other

## 2020-08-29 DIAGNOSIS — R2689 Other abnormalities of gait and mobility: Secondary | ICD-10-CM

## 2020-08-29 DIAGNOSIS — R262 Difficulty in walking, not elsewhere classified: Secondary | ICD-10-CM | POA: Diagnosis not present

## 2020-08-29 DIAGNOSIS — M25552 Pain in left hip: Secondary | ICD-10-CM

## 2020-08-29 DIAGNOSIS — R600 Localized edema: Secondary | ICD-10-CM

## 2020-08-29 NOTE — Therapy (Signed)
[place], [place], [address] Phone: [phone]   Fax:  [phone]  Physical Therapy Treatment  Patient Details  Name: Sarah Cooper MRN: [id number] Date of Birth: 03/31/1951 Referring Provider (PT): Melrose Nakayama    Encounter Date: 08/29/2020   PT End of Session - 08/29/20 1119    Visit Number 8    Number of Visits 12    Date for PT Re-Evaluation 09/07/20    Authorization Type UHC Medicaree    Progress Note Due on Visit 10    PT Start Time 1116    PT Stop Time 1155    PT Time Calculation (min) 39 min    Activity Tolerance Patient tolerated treatment well;Patient limited by fatigue    Behavior During Therapy [facility] for tasks assessed/performed           Past Medical History:  Diagnosis Date  . Anxiety   . Arthritis   . Cancer ([place]) 1978   cervical  . Depression   . Hypertension   . Pneumothorax 1977    Past Surgical History:  Procedure Laterality Date  . BREAST BIOPSY Left    approx @ 48 benign   . HIP SURGERY  2007   right  . JOINT REPLACEMENT  2007  . TOTAL HIP ARTHROPLASTY Left 07/25/2020   Procedure: LEFT TOTAL HIP ARTHROPLASTY ANTERIOR APPROACH;  Surgeon: Melrose Nakayama, MD;  Location: [facility];  Service: Orthopedics;  Laterality: Left;    There were no vitals filed for this visit.   Subjective Assessment - 08/29/20 1119    Subjective Patient says things are good. No new issues. No pain currently.    Pertinent History OA, HTN, Rt THR    Patient Stated Goals To walk without an assistive device, to go back to water aerobics, to go to [place]    Currently in Pain? No/denies                             [facility] Adult PT Treatment/Exercise - 08/29/20 0001      Knee/Hip Exercises: Stretches   Knee: Self-Stretch to increase Flexion Left;5 reps;10 seconds    Knee: Self-Stretch Limitations knee drive for hip flexion on 2nd step       Knee/Hip Exercises: Aerobic    Recumbent Bike 4 min seat 8 lv 2       Knee/Hip Exercises: Standing   Heel Raises Both;20 reps    Hip Abduction Both;1 set;10 reps;Knee straight    Lateral Step Up Both;1 set;10 reps;Hand Hold: 1;Step Height: 6"    Forward Step Up Both;1 set;10 reps;Hand Hold: 1;Step Height: 6"    Step Down Both;1 set;10 reps;Hand Hold: 2;Step Height: 6"    SLS 3x each Rt: 11" Lt 15" max     Gait Training 274ft with no AD good mechanics    Other Standing Knee Exercises band sidestepping with RTB 2 RT       Knee/Hip Exercises: Seated   Sit to Sand 10 reps;without UE support                    PT Short Term Goals - 08/01/20 1438      PT SHORT TERM GOAL #1   Title PT to be I in HEP to improve her functional ability    Time 2    Period Weeks    Status On-going    Target Date 08/17/20  PT SHORT TERM GOAL #2   Title PT hip strength to improve to allow pt to I place her LE on and off her bed    Time 2    Period Weeks    Status On-going      PT SHORT TERM GOAL #3   Title PT to be walking with her cane both in and out of the home.    Status On-going             PT Long Term Goals - 08/01/20 1440      PT LONG TERM GOAL #1   Title Pt to be I in advance HEP to allow pt to improve her functional ability    Time 6    Period Weeks    Status On-going      PT LONG TERM GOAL #2   Title PT to be able to come sit to stand without UE 8 or more times in a 30" period.    Time 6    Period Weeks    Status On-going      PT LONG TERM GOAL #3   Title PT strength in her LT hip and core to be increased to be able to go up and down 4 steps in a reciprocal manner using a handrail    Time 6    Period Weeks    Status On-going      PT LONG TERM GOAL #4   Title PT to be able to single leg stance for at least 20seconds on both LE to allow pt to feel confident walking without any assistive device.    Time 6    Period Weeks    Status On-going                 Plan - 08/29/20 1152     Clinical Impression Statement Patient tolerated session well today. Shows good progress toward therapy goals. Patient requires encouragement for rest breaks as she demonstrates visible fatigue, but is reluctant to pause activity. Added lateral step up and step down for functional LE strength progression. Patient able to progress gait training with no AD in clinic. Shows good balance and stability. Patient with improving static balance, slightly challenged with single leg stands, but much improved since start of therapy. Patient will continue to benefit from skilled therapy to progress strength and balance to improve functional mobility.    Personal Factors and Comorbidities Comorbidity 2    Comorbidities OA, HTN, back pain    Examination-Activity Limitations Bathing;Bed Mobility;Carry;Dressing;Lift;Locomotion Level;Squat;Stairs;Stand    Examination-Participation Restrictions Cleaning;Driving;Laundry;Meal Prep;Shop;Yard Work    Stability/Clinical Decision Making Stable/Uncomplicated    Rehab Potential Good    PT Frequency 2x / week    PT Duration 6 weeks    PT Treatment/Interventions ADLs/Self Care Home Management;Patient/family education;Gait training;Stair training;Therapeutic activities;Functional mobility training;Therapeutic exercise;Balance training;Manual techniques;Passive range of motion    PT Next Visit Plan Continue to progress LE functional strengthening, gait and balance as tolerated. Add foam to balance next visit    PT Home Exercise Plan sitting LAQ, ankle pumps, heelslides, quad and glut sets hip adduction; 08/03/20: standing heelraises, minisquats, sit to stands, clams, SLR and bridges; 08/22/20: begin walking program with SPC and SLS infront of counter for safety.11/11:  lunge and sidestep    Consulted and Agree with Plan of Care Patient           Patient will benefit from skilled therapeutic intervention in order to improve the following deficits and impairments:  Abnormal gait,  Decreased activity tolerance, Decreased balance, Decreased mobility, Decreased range of motion, Difficulty walking, Decreased strength, Increased edema, Pain  Visit Diagnosis: Other abnormalities of gait and mobility  Localized edema  Difficulty in walking, not elsewhere classified  Pain in left hip     Problem List Patient Active Problem List   Diagnosis Date Noted  . Primary localized osteoarthritis of left hip 07/25/2020  . Tubular adenoma of colon 12/11/2016  . Dyspnea 12/11/2016  . Vitamin D deficiency 11/21/2016  . Pre-diabetes 11/21/2016  . Essential hypertension 11/19/2016  . Depression, recurrent ([place]) 11/19/2016  . Osteoarthritis 11/19/2016  . Chronic back pain greater than 3 months duration 11/19/2016  . History of cervical dysplasia 11/19/2016  . Morbid obesity ([place]) 11/19/2016    11:58 AM, 08/29/20 Josue Hector PT DPT  Physical Therapist with [facility]  [phone] [place] [address] [place], [place], [address] Phone: [phone]   Fax:  [phone]  Name: Sarah Cooper MRN: [id number] Date of Birth: 04-Jul-1951

## 2020-08-30 ENCOUNTER — Ambulatory Visit (HOSPITAL_COMMUNITY): Payer: Medicare Other

## 2020-08-30 ENCOUNTER — Encounter (HOSPITAL_COMMUNITY): Payer: Self-pay

## 2020-08-30 DIAGNOSIS — R2689 Other abnormalities of gait and mobility: Secondary | ICD-10-CM

## 2020-08-30 DIAGNOSIS — R600 Localized edema: Secondary | ICD-10-CM | POA: Diagnosis not present

## 2020-08-30 DIAGNOSIS — R262 Difficulty in walking, not elsewhere classified: Secondary | ICD-10-CM

## 2020-08-30 DIAGNOSIS — M25552 Pain in left hip: Secondary | ICD-10-CM | POA: Diagnosis not present

## 2020-08-30 NOTE — Therapy (Signed)
[place], [place], [address] Phone: [phone]   Fax:  [phone]  Physical Therapy Treatment  Patient Details  Name: Sarah Cooper MRN: [id number] Date of Birth: 02-Jan-1951 Referring Provider (PT): Melrose Nakayama    Encounter Date: 08/30/2020   PT End of Session - 08/30/20 1140    Visit Number 9    Number of Visits 12    Date for PT Re-Evaluation 09/07/20    Authorization Type UHC Medicaree    Progress Note Due on Visit 10    PT Start Time 1137    PT Stop Time 1215    PT Time Calculation (min) 38 min    Activity Tolerance Patient tolerated treatment well;Patient limited by fatigue    Behavior During Therapy [facility] for tasks assessed/performed           Past Medical History:  Diagnosis Date  . Anxiety   . Arthritis   . Cancer ([place]) 1978   cervical  . Depression   . Hypertension   . Pneumothorax 1977    Past Surgical History:  Procedure Laterality Date  . BREAST BIOPSY Left    approx @ 48 benign   . HIP SURGERY  2007   right  . JOINT REPLACEMENT  2007  . TOTAL HIP ARTHROPLASTY Left 07/25/2020   Procedure: LEFT TOTAL HIP ARTHROPLASTY ANTERIOR APPROACH;  Surgeon: Melrose Nakayama, MD;  Location: [facility];  Service: Orthopedics;  Laterality: Left;    There were no vitals filed for this visit.   Subjective Assessment - 08/30/20 1137    Subjective Pt requested not to pushed too hard, reports some burning Lt thigh pain today.  Pt arrived without AD.    Pertinent History OA, HTN, Rt THR    Patient Stated Goals To walk without an assistive device, to go back to water aerobics, to go to [place]    Currently in Pain? Yes    Pain Score 3     Pain Location Leg    Pain Orientation Left;Anterior;Proximal   Lt thigh   Pain Descriptors / Indicators Burning    Pain Type Surgical pain    Pain Radiating Towards knee    Pain Onset In the past 7 days    Pain Frequency Constant    Aggravating Factors   exercise, activity, certain movements    Pain Relieving Factors ice, meds                             [facility] Adult PT Treatment/Exercise - 08/30/20 0001      Exercises   Exercises Knee/Hip      Knee/Hip Exercises: Stretches   Hip Flexor Stretch 3 reps;30 seconds    Hip Flexor Stretch Limitations Thomas stretch 3x 30"    Other Knee/Hip Stretches LTR 5x 10"    Other Knee/Hip Stretches SKTC 3x 30"      Knee/Hip Exercises: Standing   Heel Raises Both;2 sets;10 reps    Heel Raises Limitations incline slope; toe raises    Other Standing Knee Exercises tandem stance 2x 30" on foam intermittent HHA    Other Standing Knee Exercises RTB around thigh sidestep 2RT      Knee/Hip Exercises: Seated   Other Seated Knee/Hip Exercises Lt IR 10x 5"    Sit to Sand 10 reps;without UE support      Knee/Hip Exercises: Supine   Bridges Both;10 reps    [employer]  Limitations 5" holds                    PT Short Term Goals - 08/01/20 1438      PT SHORT TERM GOAL #1   Title PT to be I in HEP to improve her functional ability    Time 2    Period Weeks    Status On-going    Target Date 08/17/20      PT SHORT TERM GOAL #2   Title PT hip strength to improve to allow pt to I place her LE on and off her bed    Time 2    Period Weeks    Status On-going      PT SHORT TERM GOAL #3   Title PT to be walking with her cane both in and out of the home.    Status On-going             PT Long Term Goals - 08/01/20 1440      PT LONG TERM GOAL #1   Title Pt to be I in advance HEP to allow pt to improve her functional ability    Time 6    Period Weeks    Status On-going      PT LONG TERM GOAL #2   Title PT to be able to come sit to stand without UE 8 or more times in a 30" period.    Time 6    Period Weeks    Status On-going      PT LONG TERM GOAL #3   Title PT strength in her LT hip and core to be increased to be able to go up and down 4 steps in a reciprocal manner  using a handrail    Time 6    Period Weeks    Status On-going      PT LONG TERM GOAL #4   Title PT to be able to single leg stance for at least 20seconds on both LE to allow pt to feel confident walking without any assistive device.    Time 6    Period Weeks    Status On-going                 Plan - 08/30/20 1205    Clinical Impression Statement Pt arrived with reports of increased pain and fatigue with sessions back to back.  Added hip mobility exercises and stretches to address tightness.  Pt reports some relief with these stretches, given print out to add to HEP.  Pt most limited with hip mobility primarly IR. Added dynamic surface with balance activities with cueing for posture and core activation, did require some intermittent HHA.  EOS pain reduced to 2/10.    Personal Factors and Comorbidities Comorbidity 2    Comorbidities OA, HTN, back pain    Examination-Activity Limitations Bathing;Bed Mobility;Carry;Dressing;Lift;Locomotion Level;Squat;Stairs;Stand    Examination-Participation Restrictions Cleaning;Driving;Laundry;Meal Prep;Shop;Yard Work    Stability/Clinical Decision Making Stable/Uncomplicated    Clinical Decision Making Low    Rehab Potential Good    PT Frequency 2x / week    PT Duration 6 weeks    PT Treatment/Interventions ADLs/Self Care Home Management;Patient/family education;Gait training;Stair training;Therapeutic activities;Functional mobility training;Therapeutic exercise;Balance training;Manual techniques;Passive range of motion    PT Next Visit Plan 10th visit progress note.  Focus on mobility to increase ease with putting on shoes and gait/balance.    PT Home Exercise Plan sitting LAQ, ankle pumps, heelslides, quad and glut sets hip  adduction; 08/03/20: standing heelraises, minisquats, sit to stands, clams, SLR and bridges; 08/22/20: begin walking program with SPC and SLS infront of counter for safety.11/11:  lunge and sidestep; 11/17: LTR and thomas stretch            Patient will benefit from skilled therapeutic intervention in order to improve the following deficits and impairments:  Abnormal gait, Decreased activity tolerance, Decreased balance, Decreased mobility, Decreased range of motion, Difficulty walking, Decreased strength, Increased edema, Pain  Visit Diagnosis: Other abnormalities of gait and mobility  Localized edema  Difficulty in walking, not elsewhere classified  Pain in left hip     Problem List Patient Active Problem List   Diagnosis Date Noted  . Primary localized osteoarthritis of left hip 07/25/2020  . Tubular adenoma of colon 12/11/2016  . Dyspnea 12/11/2016  . Vitamin D deficiency 11/21/2016  . Pre-diabetes 11/21/2016  . Essential hypertension 11/19/2016  . Depression, recurrent ([place]) 11/19/2016  . Osteoarthritis 11/19/2016  . Chronic back pain greater than 3 months duration 11/19/2016  . History of cervical dysplasia 11/19/2016  . Morbid obesity ([place]) 11/19/2016   Ihor Austin, LPTA/CLT; CBIS [phone]  Aldona Lento 08/30/2020, 1:17 PM  [place] [address] [place], [place], [address] Phone: [phone]   Fax:  [phone]  Name: JACKLYNN DEHAAS MRN: [id number] Date of Birth: 1951/04/11

## 2020-08-31 DIAGNOSIS — H2513 Age-related nuclear cataract, bilateral: Secondary | ICD-10-CM | POA: Diagnosis not present

## 2020-09-01 ENCOUNTER — Ambulatory Visit (HOSPITAL_COMMUNITY): Payer: Medicare Other

## 2020-09-03 ENCOUNTER — Other Ambulatory Visit: Payer: Self-pay | Admitting: Cardiology

## 2020-09-03 DIAGNOSIS — I10 Essential (primary) hypertension: Secondary | ICD-10-CM

## 2020-09-04 ENCOUNTER — Ambulatory Visit: Payer: Medicare Other | Admitting: Cardiology

## 2020-09-05 ENCOUNTER — Other Ambulatory Visit: Payer: Self-pay

## 2020-09-05 ENCOUNTER — Ambulatory Visit (HOSPITAL_COMMUNITY): Payer: Medicare Other | Admitting: Physical Therapy

## 2020-09-05 ENCOUNTER — Encounter (HOSPITAL_COMMUNITY): Payer: Self-pay | Admitting: Physical Therapy

## 2020-09-05 DIAGNOSIS — R2689 Other abnormalities of gait and mobility: Secondary | ICD-10-CM

## 2020-09-05 DIAGNOSIS — R600 Localized edema: Secondary | ICD-10-CM | POA: Diagnosis not present

## 2020-09-05 DIAGNOSIS — M25552 Pain in left hip: Secondary | ICD-10-CM

## 2020-09-05 DIAGNOSIS — R262 Difficulty in walking, not elsewhere classified: Secondary | ICD-10-CM | POA: Diagnosis not present

## 2020-09-05 NOTE — Therapy (Signed)
[place] [address] [place], [place], [address] Phone: [phone]   Fax:  [phone]  Physical Therapy Treatment/Discharge Summary  Patient Details  Name: Sarah Cooper MRN: [id number] Date of Birth: 1950-11-23 Referring Provider (PT): Melrose Nakayama    Encounter Date: 09/05/2020  PHYSICAL THERAPY DISCHARGE SUMMARY  Visits from Start of Care: 10  Current functional level related to goals / functional outcomes: See below   Remaining deficits: See below   Education / Equipment: See below  Plan: Patient agrees to discharge.  Patient goals were met. Patient is being discharged due to meeting the stated rehab goals.  ?????        PT End of Session - 09/05/20 1128    Visit Number 10    Number of Visits 12    Date for PT Re-Evaluation 09/07/20    Authorization Type UHC Medicaree    Progress Note Due on Visit 10    PT Start Time 1130    PT Stop Time 1201    PT Time Calculation (min) 31 min    Activity Tolerance Patient tolerated treatment well;Patient limited by fatigue    Behavior During Therapy WFL for tasks assessed/performed           Past Medical History:  Diagnosis Date  . Anxiety   . Arthritis   . Cancer ([place]) 1978   cervical  . Depression   . Hypertension   . Pneumothorax 1977    Past Surgical History:  Procedure Laterality Date  . BREAST BIOPSY Left    approx @ 48 benign   . HIP SURGERY  2007   right  . JOINT REPLACEMENT  2007  . TOTAL HIP ARTHROPLASTY Left 07/25/2020   Procedure: LEFT TOTAL HIP ARTHROPLASTY ANTERIOR APPROACH;  Surgeon: Melrose Nakayama, MD;  [facility];  Service: Orthopedics;  Laterality: Left;    There were no vitals filed for this visit.   Subjective Assessment - 09/05/20 1129    Subjective Patient states her hip is doing alright. Her hip is still numb on the outside of her leg. Patient states 65-75% improvement with physical therapy intervention. Patient  states most difficulty with balance when she is moving around and with tying her shoe. She has hip stretches.    Pertinent History OA, HTN, Rt THR    Patient Stated Goals To walk without an assistive device, to go back to water aerobics, to go to [place]    Currently in Pain? No/denies    Pain Onset In the past 7 days              [facility] PT Assessment - 09/05/20 0001      Assessment   Medical Diagnosis Lt THR     Referring Provider (PT) Melrose Nakayama     Onset Date/Surgical Date 07/25/20    Next MD Visit January 2022    Prior Therapy hospital       Precautions   Precautions Anterior Hip      Restrictions   Weight Bearing Restrictions No      Balance Screen   Has the patient fallen in the past 6 months No    Has the patient had a decrease in activity level because of a fear of falling?  No    Is the patient reluctant to leave their home because of a fear of falling?  No      Prior Function   Level of Independence Independent  Cognition   Overall Cognitive Status Within Functional Limits for tasks assessed      Observation/Other Assessments   Observations Ambulates without AD    Focus on Therapeutic Outcomes (FOTO)  16% limited      Strength   Left Hip Flexion 4+/5    Left Hip Extension 4/5    Left Hip ABduction 3+/5      Transfers   Comments 12 STS in 30 seconds      Balance   Balance Assessed Yes      Static Standing Balance   Static Standing - Balance Support No upper extremity supported    Static Standing - Comment/# of Minutes 13 seconds LLE, 8 seconds RLE                                 PT Education - 09/05/20 1135    Education Details Patient educated reassessment findings, review of HEP, progress made, returning to PT if needed    Person(s) Educated Patient    Methods Explanation;Demonstration    Comprehension Verbalized understanding;Returned demonstration            PT Short Term Goals - 09/05/20 1136      PT  SHORT TERM GOAL #1   Title PT to be I in HEP to improve her functional ability    Time 2    Period Weeks    Status Achieved    Target Date 08/17/20      PT SHORT TERM GOAL #2   Title PT hip strength to improve to allow pt to I place her LE on and off her bed    Time 2    Period Weeks    Status Achieved      PT SHORT TERM GOAL #3   Title PT to be walking with her cane both in and out of the home.    Status Achieved             PT Long Term Goals - 09/05/20 1137      PT LONG TERM GOAL #1   Title Pt to be I in advance HEP to allow pt to improve her functional ability    Time 6    Period Weeks    Status Achieved      PT LONG TERM GOAL #2   Title PT to be able to come sit to stand without UE 8 or more times in a 30" period.    Time 6    Period Weeks    Status Achieved      PT LONG TERM GOAL #3   Title PT strength in her LT hip and core to be increased to be able to go up and down 4 steps in a reciprocal manner using a handrail    Time 6    Period Weeks    Status Achieved      PT LONG TERM GOAL #4   Title PT to be able to single leg stance for at least 20seconds on both LE to allow pt to feel confident walking without any assistive device.    Time 6    Period Weeks    Status Partially Met                 Plan - 09/05/20 1128    Clinical Impression Statement Patient has met all short and long term goals with the exception of 1 long term goal  related to single leg balance. Patient had made progress toward goal but remains limited. Goals met due to ability to complete HEP and improved strength, gait, functional mobility. Patient remains limited by balance and impaired ROM with difficulty putting on L shoe. Patient educated on importance of continuing HEP. Discussed beginning Tai chi with patient as she is interested in beginning classes. Patient able to navigate stairs and show improved functional strength without deviation. Patient discharged from physical therapy at  this time.    Personal Factors and Comorbidities Comorbidity 2    Comorbidities OA, HTN, back pain    Examination-Activity Limitations Bathing;Bed Mobility;Carry;Dressing;Lift;Locomotion Level;Squat;Stairs;Stand    Examination-Participation Restrictions Cleaning;Driving;Laundry;Meal Prep;Shop;Yard Work    Stability/Clinical Decision Making Stable/Uncomplicated    Rehab Potential Good    PT Frequency --    PT Duration --    PT Treatment/Interventions ADLs/Self Care Home Management;Patient/family education;Gait training;Stair training;Therapeutic activities;Functional mobility training;Therapeutic exercise;Balance training;Manual techniques;Passive range of motion    PT Next Visit Plan n/a    PT Home Exercise Plan sitting LAQ, ankle pumps, heelslides, quad and glut sets hip adduction; 08/03/20: standing heelraises, minisquats, sit to stands, clams, SLR and bridges; 08/22/20: begin walking program with SPC and SLS infront of counter for safety.11/11:  lunge and sidestep; 11/17: LTR and thomas stretch           Patient will benefit from skilled therapeutic intervention in order to improve the following deficits and impairments:  Abnormal gait, Decreased activity tolerance, Decreased balance, Decreased mobility, Decreased range of motion, Difficulty walking, Decreased strength, Increased edema, Pain  Visit Diagnosis: Other abnormalities of gait and mobility  Localized edema  Difficulty in walking, not elsewhere classified  Pain in left hip     Problem List Patient Active Problem List   Diagnosis Date Noted  . Primary localized osteoarthritis of left hip 07/25/2020  . Tubular adenoma of colon 12/11/2016  . Dyspnea 12/11/2016  . Vitamin D deficiency 11/21/2016  . Pre-diabetes 11/21/2016  . Essential hypertension 11/19/2016  . Depression, recurrent ([place]) 11/19/2016  . Osteoarthritis 11/19/2016  . Chronic back pain greater than 3 months duration 11/19/2016  . History of cervical  dysplasia 11/19/2016  . Morbid obesity ([place]) 11/19/2016    12:07 PM, 09/05/20 Mearl Latin PT, DPT Physical Therapist at [place], [place], [address] Phone: [phone]   Fax:  [phone]  Name: ESTEPHANI POPPER MRN: [id number] Date of Birth: [birth year]

## 2020-09-06 ENCOUNTER — Ambulatory Visit (HOSPITAL_COMMUNITY): Payer: Medicare Other | Admitting: Physical Therapy

## 2020-09-12 ENCOUNTER — Ambulatory Visit (HOSPITAL_COMMUNITY): Payer: Medicare Other

## 2020-09-14 ENCOUNTER — Ambulatory Visit (HOSPITAL_COMMUNITY): Payer: Medicare Other | Admitting: Physical Therapy

## 2020-09-25 DIAGNOSIS — R42 Dizziness and giddiness: Secondary | ICD-10-CM | POA: Diagnosis not present

## 2020-09-25 DIAGNOSIS — R7301 Impaired fasting glucose: Secondary | ICD-10-CM | POA: Diagnosis not present

## 2020-09-25 DIAGNOSIS — J069 Acute upper respiratory infection, unspecified: Secondary | ICD-10-CM | POA: Diagnosis not present

## 2020-09-25 DIAGNOSIS — N390 Urinary tract infection, site not specified: Secondary | ICD-10-CM | POA: Diagnosis not present

## 2020-09-25 DIAGNOSIS — I10 Essential (primary) hypertension: Secondary | ICD-10-CM | POA: Diagnosis not present

## 2020-09-25 DIAGNOSIS — R2689 Other abnormalities of gait and mobility: Secondary | ICD-10-CM | POA: Diagnosis not present

## 2020-09-28 ENCOUNTER — Other Ambulatory Visit: Payer: Medicare Other

## 2020-10-02 DIAGNOSIS — Z712 Person consulting for explanation of examination or test findings: Secondary | ICD-10-CM | POA: Diagnosis not present

## 2020-10-02 DIAGNOSIS — R42 Dizziness and giddiness: Secondary | ICD-10-CM | POA: Diagnosis not present

## 2020-10-02 DIAGNOSIS — J069 Acute upper respiratory infection, unspecified: Secondary | ICD-10-CM | POA: Diagnosis not present

## 2020-10-02 DIAGNOSIS — N390 Urinary tract infection, site not specified: Secondary | ICD-10-CM | POA: Diagnosis not present

## 2020-10-05 DIAGNOSIS — I10 Essential (primary) hypertension: Secondary | ICD-10-CM | POA: Diagnosis not present

## 2020-10-05 DIAGNOSIS — R42 Dizziness and giddiness: Secondary | ICD-10-CM | POA: Diagnosis not present

## 2020-10-05 DIAGNOSIS — N390 Urinary tract infection, site not specified: Secondary | ICD-10-CM | POA: Diagnosis not present

## 2020-10-05 DIAGNOSIS — J069 Acute upper respiratory infection, unspecified: Secondary | ICD-10-CM | POA: Diagnosis not present

## 2020-10-13 ENCOUNTER — Other Ambulatory Visit: Payer: Self-pay | Admitting: Cardiology

## 2020-10-13 DIAGNOSIS — I10 Essential (primary) hypertension: Secondary | ICD-10-CM | POA: Diagnosis not present

## 2020-10-13 DIAGNOSIS — E7849 Other hyperlipidemia: Secondary | ICD-10-CM | POA: Diagnosis not present

## 2020-10-18 ENCOUNTER — Other Ambulatory Visit: Payer: Medicare Other

## 2020-10-23 DIAGNOSIS — Z96642 Presence of left artificial hip joint: Secondary | ICD-10-CM | POA: Diagnosis not present

## 2020-10-23 DIAGNOSIS — Z9889 Other specified postprocedural states: Secondary | ICD-10-CM | POA: Diagnosis not present

## 2020-10-26 ENCOUNTER — Ambulatory Visit: Payer: Medicare Other | Admitting: Cardiology

## 2020-11-07 ENCOUNTER — Other Ambulatory Visit: Payer: Medicare Other

## 2020-11-17 ENCOUNTER — Ambulatory Visit: Payer: Medicare Other | Admitting: Cardiology

## 2020-11-21 ENCOUNTER — Ambulatory Visit: Payer: Medicare Other

## 2020-11-21 ENCOUNTER — Other Ambulatory Visit: Payer: Self-pay

## 2020-11-21 DIAGNOSIS — R0989 Other specified symptoms and signs involving the circulatory and respiratory systems: Secondary | ICD-10-CM

## 2020-11-21 DIAGNOSIS — I6522 Occlusion and stenosis of left carotid artery: Secondary | ICD-10-CM

## 2020-11-23 ENCOUNTER — Other Ambulatory Visit: Payer: Self-pay | Admitting: Internal Medicine

## 2020-11-23 DIAGNOSIS — Z1231 Encounter for screening mammogram for malignant neoplasm of breast: Secondary | ICD-10-CM

## 2020-11-23 DIAGNOSIS — I10 Essential (primary) hypertension: Secondary | ICD-10-CM | POA: Diagnosis not present

## 2020-11-24 ENCOUNTER — Ambulatory Visit
Admission: RE | Admit: 2020-11-24 | Discharge: 2020-11-24 | Disposition: A | Discharge status: Home / Self Care | Payer: Medicare Other | Source: Ambulatory Visit | Attending: Internal Medicine | Admitting: Internal Medicine

## 2020-11-24 ENCOUNTER — Other Ambulatory Visit: Payer: Self-pay

## 2020-11-24 DIAGNOSIS — Z1231 Encounter for screening mammogram for malignant neoplasm of breast: Secondary | ICD-10-CM

## 2020-11-24 LAB — BASIC METABOLIC PANEL
BUN/Creatinine Ratio: 27 (ref 12–28)
BUN: 26 mg/dL (ref 8–27)
CO2: 21 mmol/L (ref 20–29)
Calcium: 10.2 mg/dL (ref 8.7–10.3)
Chloride: 98 mmol/L (ref 96–106)
Creatinine, Ser: 0.96 mg/dL (ref 0.57–1.00)
GFR calc Af Amer: 69 mL/min/{1.73_m2} (ref >59)
GFR calc non Af Amer: 60 mL/min/{1.73_m2} (ref >59)
Glucose: 116 mg/dL — ABNORMAL HIGH (ref 65–99)
Potassium: 4.7 mmol/L (ref 3.5–5.2)
Sodium: 135 mmol/L (ref 134–144)

## 2020-11-25 ENCOUNTER — Other Ambulatory Visit: Payer: Self-pay | Admitting: Cardiology

## 2020-11-25 DIAGNOSIS — I6521 Occlusion and stenosis of right carotid artery: Secondary | ICD-10-CM

## 2020-11-27 ENCOUNTER — Ambulatory Visit: Payer: Medicare Other | Admitting: Cardiology

## 2020-11-27 ENCOUNTER — Encounter: Payer: Self-pay | Admitting: Cardiology

## 2020-11-27 ENCOUNTER — Other Ambulatory Visit: Payer: Self-pay

## 2020-11-27 VITALS — BP 131/67 | HR 100 | Temp 98.1°F | Resp 16 | Ht 62.0 in | Wt 204.6 lb

## 2020-11-27 DIAGNOSIS — R0602 Shortness of breath: Secondary | ICD-10-CM | POA: Diagnosis not present

## 2020-11-27 DIAGNOSIS — I10 Essential (primary) hypertension: Secondary | ICD-10-CM

## 2020-11-27 DIAGNOSIS — I6521 Occlusion and stenosis of right carotid artery: Secondary | ICD-10-CM

## 2020-11-27 DIAGNOSIS — I5032 Chronic diastolic (congestive) heart failure: Secondary | ICD-10-CM

## 2020-11-27 NOTE — Progress Notes (Signed)
Primary Physician/Referring:  Celene Squibb, MD  Patient ID: Sarah Cooper, female    DOB: 10-Jun-1951, 70 y.o.   MRN: [id number]  Chief Complaint  Patient presents with  . Hypertension  . Diastolic Heart Failure  . Chronic Renal Failure  . Follow-up    4 week  . Congestive Heart Failure   HPI:    Sarah Cooper  is a 70 y.o. African American female with hypertension, mild asymptomatic left carotid stenosis, mild hyperlipidemia, prediabetes mellitus, insomnia, bilateral mild leg edema secondary to varicose veins, degenerative arthritis, elevated BMI (BMI 39) this is a 63-month office visit for follow-up of chronic diastolic heart failure, hypertension and carotid stenosis.  States that she has not had any acute decompensated heart failure symptoms including PND or orthopnea or leg edema.  She underwent left hip replacement in October 2021 without any periprocedural cardiac complication.  Except for mild exertional dyspnea, she has not had any chest pain, palpitations, dizziness or syncope.  She is tolerating all her medications well.     Past Medical History:  Diagnosis Date  . Anxiety   . Arthritis   . Cancer ([place]) 1978   cervical  . Depression   . Hypertension   . Pneumothorax 1977   Past Surgical History:  Procedure Laterality Date  . BREAST BIOPSY Left    approx @ 48 benign   . HIP SURGERY  2007   right  . JOINT REPLACEMENT  2007  . TOTAL HIP ARTHROPLASTY Left 07/25/2020   Procedure: LEFT TOTAL HIP ARTHROPLASTY ANTERIOR APPROACH;  Surgeon: Melrose Nakayama, MD;  Location: [facility];  Service: Orthopedics;  Laterality: Left;   Social History   Tobacco Use  . Smoking status: Former Smoker    Packs/day: 1.00    Years: 50.00    Pack years: 50.00    Types: Cigarettes    Start date: 1966    Quit date: 10/14/2010    Years since quitting: 10.1  . Smokeless tobacco: Never Used  Substance Use Topics  . Alcohol use: Yes    Alcohol/week: 3.0 standard drinks     Types: 3 Glasses of wine per week   Marital status: Single   ROS  Review of Systems  Constitutional: Negative for malaise/fatigue.  Eyes: Negative for blurred vision.  Cardiovascular: Positive for chest pain, dyspnea on exertion and leg swelling (occasional). Negative for paroxysmal nocturnal dyspnea and syncope.  Respiratory: Positive for snoring. Negative for cough and sputum production.   Musculoskeletal: Positive for arthritis (DJD and severe pain right hip).   Objective   Vitals with BMI 11/27/2020 11/27/2020 07/25/2020  Height - 5\' 2"  -  Weight - 204 lbs 10 oz -  BMI - 56.38 -  Systolic 756 433 295  Diastolic 67 66 82  Pulse 188 99 97    Physical Exam Constitutional:      Comments: Morbidly obese in no acute distress  Eyes:     General: No scleral icterus.    Conjunctiva/sclera: Conjunctivae normal.  Cardiovascular:     Rate and Rhythm: Normal rate. Rhythm irregular.     Pulses:          Carotid pulses are on the right side with bruit and on the left side with bruit.      Radial pulses are 2+ on the right side and 2+ on the left side.       Popliteal pulses are 2+ on the right side and 2+ on the left side.  Dorsalis pedis pulses are 2+ on the right side and 2+ on the left side.       Posterior tibial pulses are 0 on the right side and 0 on the left side.     Heart sounds: Murmur heard.   Medium-pitched midsystolic murmur is present with a grade of 2/6 at the upper right sternal border radiating to the neck.     Comments: No leg edema. No JVD.  Pulmonary:     Effort: Pulmonary effort is normal.     Breath sounds: Normal breath sounds. No wheezing.  Abdominal:     Palpations: Abdomen is soft.     Tenderness: There is no abdominal tenderness.  Musculoskeletal:        General: Normal range of motion.     Cervical back: Neck supple.  Skin:    General: Skin is warm and dry.  Neurological:     Mental Status: She is alert and oriented to person, place, and time.     Laboratory examination:   Recent Labs    03/30/20 1050 04/13/20 1110 07/19/20 0842 11/23/20 1118  NA 139 136 137 135  K 4.5 5.2 4.6 4.7  CL 103 95* 101 98  CO2 22 23 25 21   GLUCOSE 103* 117* 117* 116*  BUN 19 42* 23 26  CREATININE 0.82 1.50* 0.76 0.96  CALCIUM 10.1 10.6* 9.8 10.2  GFRNONAA 73 35* >60 60  GFRAA 84 40*  --  69   CMP Latest Ref Rng & Units 11/23/2020 07/19/2020 04/13/2020  Glucose 65 - 99 mg/dL 116(H) 117(H) 117(H)  BUN 8 - 27 mg/dL 26 23 42(H)  Creatinine 0.57 - 1.00 mg/dL 0.96 0.76 1.50(H)  Sodium 134 - 144 mmol/L 135 137 136  Potassium 3.5 - 5.2 mmol/L 4.7 4.6 5.2  Chloride 96 - 106 mmol/L 98 101 95(L)  CO2 20 - 29 mmol/L 21 25 23   Calcium 8.7 - 10.3 mg/dL 10.2 9.8 10.6(H)  Total Protein 6.1 - 8.1 g/dL - - -  Total Bilirubin 0.2 - 1.2 mg/dL - - -  Alkaline Phos 33 - 130 U/L - - -  AST 10 - 35 U/L - - -  ALT 6 - 29 U/L - - -   CBC Latest Ref Rng & Units 07/19/2020 11/19/2016  WBC 4.0 - 10.5 K/uL 9.3 6.5  Hemoglobin 12.0 - 15.0 g/dL 11.5(L) 10.8(L)  Hematocrit 36.0 - 46.0 % 37.0 34.8(L)  Platelets 150 - 400 K/uL 293 281   Lipid Panel No results for input(s): CHOL, TRIG, LDLCALC, VLDL, HDL, CHOLHDL, LDLDIRECT in the last 8760 hours.   BNP (last 3 results) Recent Labs    03/30/20 1050  BNP 24.7   ProBNP (last 3 results) Recent Labs    04/13/20 1109  PROBNP 36    HEMOGLOBIN A1C  A1c 5.9% in 07/2019  TSH No results for input(s): TSH in the last 8760 hours.   External labs:   Cholesterol, total 184.000 M 09/25/2020 HDL 92.000 MG 09/25/2020 LDL 61.000 mg 11/12/2019 Triglycerides 88.000 MG 09/25/2020  A1C 5.900 % 09/25/2020 TSH 1.400 01/27/2020  Hemoglobin 11.000 G/ 09/25/2020 Platelets 323.000 X1 09/25/2020   Creatinine, Serum 0.960 mg/ 11/23/2020 Potassium 4.700 mm 11/23/2020 ALT (SGPT) 16.000 IU/ 09/25/2020  Medications and allergies   No Known Allergies  Current Outpatient Medications  Medication Instructions  . amLODipine  (NORVASC) 10 MG tablet TAKE 1 TABLET BY MOUTH EVERY DAY  . atorvastatin (LIPITOR) 10 mg, Oral, Daily  . Blood Pressure Monitor MISC  Disp. 1 b/p monitor.  Marland Kitchen lisinopril-hydrochlorothiazide (PRINZIDE,ZESTORETIC) 20-25 MG tablet 1 tablet, Oral, Daily  . meloxicam (MOBIC) 15 mg, Daily  . QUEtiapine (SEROQUEL) 50 mg, Oral, Daily at bedtime  . spironolactone (ALDACTONE) 25 MG tablet TAKE 1 TABLET BY MOUTH EVERY DAY  . traMADol (ULTRAM) 50 mg, Oral, Every 6 hours PRN   Radiology:   Cardiac Studies:   Lexiscan (Walking with mod Bruce)Tetrofosmin Stress Test  07/10/2020: Nondiagnostic ECG stress. Myocardial perfusion is normal. Overall LV systolic function is normal without regional wall motion abnormalities. Stress LV EF: 57%.  Compared to the study done on 09/06/2019, no change in perfusion, previously LVEF was calculated at 30%.  Low risk.  Echocardiogram 04/06/2020:  Study Quality: Technically Difficult  Normal LV systolic function with visual EF 55-60%. Left ventricle cavity  is normal in size. Mild left ventricular hypertrophy. Normal global wall  motion. Normal diastolic filling pattern, normal LAP. Calculated EF 57%.  Moderate (Grade II) aortic regurgitation.  Mild tricuspid regurgitation. No evidence of pulmonary hypertension.  No significant change compared to previous study dated 08/19/2019.   Carotid artery duplex 11/21/2020: Doppler velocity suggests stenosis in the right internal carotid artery (16-49%). Peak systolic velocities in the left bifurcation, internal, external and common carotid arteries are within normal limits. Mild heterogeneous plaque noted bilaterally. Antegrade right vertebral artery flow. Antegrade left vertebral artery flow. Follow up in one year is appropriate if clinically indicated.   EKG:   EKG 11/27/2020: Normal sinus rhythm at rate of 92 bpm, normal axis, poor R wave progression, probably normal variant.  No evidence of ischemia, otherwise normal  EKG.  No significant change from EKG 03/30/2020   Assessment     ICD-10-CM   1. Essential hypertension  I10 EKG 12-Lead  2. Shortness of breath  R06.02   3. Chronic diastolic (congestive) heart failure (HCC)  I50.32   4. Stenosis of right carotid artery  I65.21      Recommendations:   Sarah Cooper  is a 70 y.o. African American female with hypertension, mild asymptomatic left carotid stenosis, mild hyperlipidemia, prediabetes mellitus, insomnia, bilateral mild leg edema secondary to varicose veins, degenerative arthritis, elevated BMI (BMI 39) previously evaluated for dyspnea on exertion, has had a low risk stress test in 2020.    She is presently doing well, she has not had any further PND or orthopnea, dyspnea has remained stable.  I have again discussed with her regarding weight loss and avoidance of excess salt.  She is not using furosemide, she has had worsening renal function after she reduced furosemide 6 months ago.  Fortunately there is no leg edema, no acute decompensated heart failure on physical exam.  She does have moderate aortic regurgitation but there is no change in auscultation.  Only a systolic murmur is heard.  Blood pressure is also well controlled.  I reviewed her external labs, lipids are under excellent control as well.  Very mild carotid stenosis, will continue surveillance in a year and I would like to see her back then.  No changes in the medications were done today.   Adrian Prows, MD, [facility] 11/27/2020, 9:22 AM Office: [phone]

## 2020-12-01 ENCOUNTER — Ambulatory Visit (INDEPENDENT_AMBULATORY_CARE_PROVIDER_SITE_OTHER): Payer: Medicare Other | Admitting: Nurse Practitioner

## 2020-12-01 ENCOUNTER — Other Ambulatory Visit: Payer: Self-pay

## 2020-12-01 ENCOUNTER — Encounter: Payer: Self-pay | Admitting: Nurse Practitioner

## 2020-12-01 VITALS — BP 164/78 | HR 96 | Temp 98.2°F | Resp 20 | Ht 62.0 in | Wt 206.0 lb

## 2020-12-01 DIAGNOSIS — I10 Essential (primary) hypertension: Secondary | ICD-10-CM

## 2020-12-01 DIAGNOSIS — G8929 Other chronic pain: Secondary | ICD-10-CM

## 2020-12-01 DIAGNOSIS — I5032 Chronic diastolic (congestive) heart failure: Secondary | ICD-10-CM

## 2020-12-01 DIAGNOSIS — E559 Vitamin D deficiency, unspecified: Secondary | ICD-10-CM | POA: Diagnosis not present

## 2020-12-01 DIAGNOSIS — G4700 Insomnia, unspecified: Secondary | ICD-10-CM

## 2020-12-01 DIAGNOSIS — M1991 Primary osteoarthritis, unspecified site: Secondary | ICD-10-CM

## 2020-12-01 DIAGNOSIS — R600 Localized edema: Secondary | ICD-10-CM | POA: Diagnosis not present

## 2020-12-01 DIAGNOSIS — E785 Hyperlipidemia, unspecified: Secondary | ICD-10-CM

## 2020-12-01 DIAGNOSIS — R7303 Prediabetes: Secondary | ICD-10-CM

## 2020-12-01 DIAGNOSIS — M549 Dorsalgia, unspecified: Secondary | ICD-10-CM | POA: Diagnosis not present

## 2020-12-01 DIAGNOSIS — Z7689 Persons encountering health services in other specified circumstances: Secondary | ICD-10-CM

## 2020-12-01 HISTORY — DX: Hyperlipidemia, unspecified: E78.5

## 2020-12-01 NOTE — Assessment & Plan Note (Signed)
-  BP elevated today, but she states that she hasn't taken her BP medicine -takes amlodipine -takes lisinopril/HCTZ -takes spironolactone

## 2020-12-01 NOTE — Assessment & Plan Note (Signed)
-  will check A1c with next set of labs

## 2020-12-01 NOTE — Assessment & Plan Note (Signed)
-  takes seroquel 50 mg qhs  -working well

## 2020-12-01 NOTE — Assessment & Plan Note (Signed)
-  followed by Dr. Einar Gip

## 2020-12-01 NOTE — Assessment & Plan Note (Addendum)
-  will get labs with next draw; due April -takes atorvastatin

## 2020-12-01 NOTE — Assessment & Plan Note (Signed)
-  due to varicose veins -chronic

## 2020-12-01 NOTE — Assessment & Plan Note (Signed)
-  takes tramadol for pain

## 2020-12-01 NOTE — Assessment & Plan Note (Addendum)
-  affects back, hips, and knees -takes tramadol q6h PRN -hx of left hip replacement

## 2020-12-01 NOTE — Patient Instructions (Signed)
It was great seeing you again today!

## 2020-12-01 NOTE — Progress Notes (Signed)
New Patient Office Visit  Subjective:  Patient ID: Sarah Cooper, female    DOB: 01-17-1951  Age: 70 y.o. MRN: [id number]  CC:  Chief Complaint  Patient presents with  . New Patient (Initial Visit)    HPI Sarah Cooper presents for new patient visit. Transferring care from Sarah Neighbors, MD. Last physical was her last visit at Dr. Juel Cooper, but she can't recall the date. Her next appointment was upcoming in April. Last labs were drawn with Dr. Einar Cooper.  She states she has right hand numbness when she wakes up at night.  Past Medical History:  Diagnosis Date  . Anxiety   . Arthritis    back, hips, knees  . Cancer ([place]) 1978   cervical  . CHF (congestive heart failure) ([place])    Phreesia 11/28/2020  . Depression   . Hyperlipidemia 12/01/2020  . Hypertension   . Pneumothorax 1977    Past Surgical History:  Procedure Laterality Date  . BREAST BIOPSY Left    approx @ 48 benign   . HIP SURGERY  2007   right  . JOINT REPLACEMENT  2007  . TOTAL HIP ARTHROPLASTY Left 07/25/2020   Procedure: LEFT TOTAL HIP ARTHROPLASTY ANTERIOR APPROACH;  Surgeon: Sarah Nakayama, MD;  Location: [facility];  Service: Orthopedics;  Laterality: Left;    Family History  Problem Relation Age of Onset  . Heart disease Mother 63  . Arthritis Mother   . Diabetes Mother   . Alcohol abuse Father   . Cancer Father 4       lung  . Drug abuse Daughter   . Asthma Son   . Hypertension Son   . Mental illness Maternal Aunt   . Diabetes Maternal Grandmother   . Diabetes Paternal Grandmother   . Mental illness Daughter        schizophrenia  . Depression Daughter   . Mental illness Daughter        anxiety  . Obesity Daughter        bariatric surgery  . Heart disease Maternal Aunt     Social History   Socioeconomic History  . Marital status: Single    Spouse name: Not on file  . Number of children: 4  . Years of education: Not on file  . Highest education level: Not on file   Occupational History  . Occupation: retired- from [employer]  . Smoking status: Former Smoker    Packs/day: 1.00    Years: 50.00    Pack years: 50.00    Types: Cigarettes    Start date: 1966    Quit date: 10/14/2010    Years since quitting: 10.1  . Smokeless tobacco: Never Used  Vaping Use  . Vaping Use: Never used  Substance and Sexual Activity  . Alcohol use: Yes    Alcohol/week: 3.0 standard drinks    Types: 3 Glasses of wine per week    Comment: 3 glasses per week  . Drug use: No  . Sexual activity: Not Currently  Other Topics Concern  . Not on file  Social History Narrative  . Not on file   Social Determinants of Health   Financial Resource Strain: Not on file  Food Insecurity: Not on file  Transportation Needs: Not on file  Physical Activity: Not on file  Stress: Not on file  Social Connections: Not on file  Intimate Partner Violence: Not on file    ROS Review of Systems  Constitutional: Negative.  Respiratory: Negative.   Cardiovascular: Negative.   Musculoskeletal: Positive for arthralgias.  Neurological: Positive for numbness.       To wright wrist when she wakes up  Psychiatric/Behavioral: Negative.     Objective:   Today's Vitals: BP (!) 164/78   Pulse 96   Temp 98.2 F (36.8 C)   Resp 20   Ht '5\' 2"'  (1.575 m)   Wt 206 lb (93.4 kg)   LMP 10/15/2007 (Approximate)   SpO2 96%   BMI 37.68 kg/m   Physical Exam Constitutional:      Appearance: She is obese.  Cardiovascular:     Rate and Rhythm: Normal rate and regular rhythm.     Pulses: Normal pulses.     Heart sounds: Normal heart sounds.  Pulmonary:     Effort: Pulmonary effort is normal.     Breath sounds: Normal breath sounds.  Musculoskeletal:     Comments: Negative Phalen's test  Neurological:     Mental Status: She is alert.  Psychiatric:        Mood and Affect: Mood normal.        Behavior: Behavior normal.        Thought Content: Thought content  normal.        Judgment: Judgment normal.     Assessment & Plan:   Problem List Items Addressed This Visit      Cardiovascular and Mediastinum   Essential hypertension    -BP elevated today, but she states that she hasn't taken her BP medicine -takes amlodipine -takes lisinopril/HCTZ -takes spironolactone      Relevant Orders   CBC with Differential/Platelet   CMP14+EGFR   Chronic diastolic heart failure ([place])    -followed by Dr. Einar Cooper        Musculoskeletal and Integument   Osteoarthritis    -affects back, hips, and knees -takes tramadol q6h PRN -hx of left hip replacement        Other   Chronic back pain greater than 3 months duration    -takes tramadol for pain      Vitamin D deficiency   Pre-diabetes    -will check A1c with next set of labs      Relevant Orders   Microalbumin / creatinine urine ratio   Hemoglobin A1c   Insomnia, unspecified    -takes seroquel 50 mg qhs  -working well      Hyperlipidemia    -will get labs with next draw; due April -takes atorvastatin      Relevant Orders   Lipid Panel With LDL/HDL Ratio   Leg edema    -due to varicose veins -chronic       Other Visit Diagnoses    Encounter to establish care    -  Primary   Relevant Orders   CBC with Differential/Platelet   CMP14+EGFR   Lipid Panel With LDL/HDL Ratio   Microalbumin / creatinine urine ratio   Hemoglobin A1c      Outpatient Encounter Medications as of 12/01/2020  Medication Sig  . amLODipine (NORVASC) 10 MG tablet TAKE 1 TABLET BY MOUTH EVERY DAY (Patient taking differently: Take 10 mg by mouth daily.)  . atorvastatin (LIPITOR) 10 MG tablet Take 1 tablet (10 mg total) by mouth daily.  Marland Kitchen lisinopril-hydrochlorothiazide (PRINZIDE,ZESTORETIC) 20-25 MG tablet Take 1 tablet by mouth daily.  . meloxicam (MOBIC) 15 MG tablet Take 15 mg by mouth daily.  . QUEtiapine (SEROQUEL) 50 MG tablet Take 50 mg by mouth at bedtime.  Marland Kitchen  spironolactone (ALDACTONE) 25 MG  tablet TAKE 1 TABLET BY MOUTH EVERY DAY  . traMADol (ULTRAM) 50 MG tablet Take 1 tablet (50 mg total) by mouth every 6 (six) hours as needed.  . [DISCONTINUED] Blood Pressure Monitor MISC Disp. 1 b/p monitor. (Patient not taking: Reported on 12/01/2020)   No facility-administered encounter medications on file as of 12/01/2020.    Follow-up: Return in about 2 months (around 02/08/2021).   Noreene Larsson, NP

## 2020-12-28 ENCOUNTER — Other Ambulatory Visit: Payer: Self-pay

## 2020-12-28 DIAGNOSIS — F339 Major depressive disorder, recurrent, unspecified: Secondary | ICD-10-CM

## 2020-12-28 MED ORDER — QUETIAPINE FUMARATE 50 MG PO TABS: 50.0000 mg | ORAL_TABLET | Freq: Every day | ORAL | 1 refills | Status: DC

## 2021-01-12 ENCOUNTER — Other Ambulatory Visit: Payer: Self-pay | Admitting: Cardiology

## 2021-01-12 DIAGNOSIS — E785 Hyperlipidemia, unspecified: Secondary | ICD-10-CM

## 2021-01-30 ENCOUNTER — Ambulatory Visit: Payer: Medicare Other | Admitting: Nurse Practitioner

## 2021-02-06 ENCOUNTER — Other Ambulatory Visit: Payer: Self-pay

## 2021-02-06 ENCOUNTER — Encounter: Payer: Self-pay | Admitting: Nurse Practitioner

## 2021-02-06 ENCOUNTER — Ambulatory Visit (INDEPENDENT_AMBULATORY_CARE_PROVIDER_SITE_OTHER): Payer: Medicare Other | Admitting: Nurse Practitioner

## 2021-02-06 VITALS — BP 147/74 | HR 94 | Temp 98.5°F | Resp 18 | Ht 62.0 in | Wt 206.0 lb

## 2021-02-06 DIAGNOSIS — E785 Hyperlipidemia, unspecified: Secondary | ICD-10-CM

## 2021-02-06 DIAGNOSIS — I10 Essential (primary) hypertension: Secondary | ICD-10-CM

## 2021-02-06 DIAGNOSIS — M62838 Other muscle spasm: Secondary | ICD-10-CM | POA: Diagnosis not present

## 2021-02-06 MED ORDER — TIZANIDINE HCL 4 MG PO TABS: 4.0000 mg | ORAL_TABLET | Freq: Four times a day (QID) | ORAL | 1 refills | Status: DC | PRN

## 2021-02-06 NOTE — Assessment & Plan Note (Signed)
-  refilled tizanidine 

## 2021-02-06 NOTE — Assessment & Plan Note (Addendum)
-  BP slightly elevated today -has been taking lisinopril-HCTZ 20/25 mg as well as spironolactone 25 mg daily -home BPs look great, so no change in meds today

## 2021-02-06 NOTE — Assessment & Plan Note (Signed)
-  will check labs -has been taking atorvastatin

## 2021-02-06 NOTE — Progress Notes (Signed)
Established Patient Office Visit  Subjective:  Patient ID: Sarah Cooper, female    DOB: 1951/09/10  Age: 70 y.o. MRN: [id number]  CC:  Chief Complaint  Patient presents with  . Follow-up  . Hypertension  . Spasms    Pt has hx of muscle spasms in legs and hands, these have started again within the last month. Has used tizanidine in the past.     HPI Sarah Cooper presents for lab follow-up. She established care on 12/01/20, and at that time had BP 164/78 (but hadn't taken her BP meds).  She brought in her home readings, and most of them have SBP in the 120s-130s.  She has a few readings in the 140s, but most are great.  Past Medical History:  Diagnosis Date  . Anxiety   . Arthritis    back, hips, knees  . Cancer ([place]) 1978   cervical  . CHF (congestive heart failure) ([place])    Phreesia 11/28/2020  . Depression   . Hyperlipidemia 12/01/2020  . Hypertension   . Pneumothorax 1977    Past Surgical History:  Procedure Laterality Date  . BREAST BIOPSY Left    approx @ 48 benign   . HIP SURGERY  2007   right  . JOINT REPLACEMENT  2007  . TOTAL HIP ARTHROPLASTY Left 07/25/2020   Procedure: LEFT TOTAL HIP ARTHROPLASTY ANTERIOR APPROACH;  Surgeon: Melrose Nakayama, MD;  Location: [facility];  Service: Orthopedics;  Laterality: Left;    Family History  Problem Relation Age of Onset  . Heart disease Mother 68  . Arthritis Mother   . Diabetes Mother   . Alcohol abuse Father   . Cancer Father 58       lung  . Drug abuse Daughter   . Asthma Son   . Hypertension Son   . Mental illness Maternal Aunt   . Diabetes Maternal Grandmother   . Diabetes Paternal Grandmother   . Mental illness Daughter        schizophrenia  . Depression Daughter   . Mental illness Daughter        anxiety  . Obesity Daughter        bariatric surgery  . Heart disease Maternal Aunt     Social History   Socioeconomic History  . Marital status: Single    Spouse name: Not on file  .  Number of children: 4  . Years of education: Not on file  . Highest education level: Not on file  Occupational History  . Occupation: retired- from [employer]  . Smoking status: Former Smoker    Packs/day: 1.00    Years: 50.00    Pack years: 50.00    Types: Cigarettes    Start date: 1966    Quit date: 10/14/2010    Years since quitting: 10.3  . Smokeless tobacco: Never Used  Vaping Use  . Vaping Use: Never used  Substance and Sexual Activity  . Alcohol use: Yes    Alcohol/week: 3.0 standard drinks    Types: 3 Glasses of wine per week    Comment: 3 glasses per week  . Drug use: No  . Sexual activity: Not Currently  Other Topics Concern  . Not on file  Social History Narrative  . Not on file   Social Determinants of Health   Financial Resource Strain: Not on file  Food Insecurity: Not on file  Transportation Needs: Not on file  Physical Activity: Not on file  Stress: Not on file  Social Connections: Not on file  Intimate Partner Violence: Not on file    Outpatient Medications Prior to Visit  Medication Sig Dispense Refill  . amLODipine (NORVASC) 10 MG tablet TAKE 1 TABLET BY MOUTH EVERY DAY (Patient taking differently: Take 10 mg by mouth daily.) 90 tablet 3  . atorvastatin (LIPITOR) 10 MG tablet TAKE 1 TABLET BY MOUTH EVERY DAY 90 tablet 3  . lisinopril-hydrochlorothiazide (PRINZIDE,ZESTORETIC) 20-25 MG tablet Take 1 tablet by mouth daily. 90 tablet 3  . QUEtiapine (SEROQUEL) 50 MG tablet Take 1 tablet (50 mg total) by mouth at bedtime. 30 tablet 1  . spironolactone (ALDACTONE) 25 MG tablet TAKE 1 TABLET BY MOUTH EVERY DAY 90 tablet 0  . traMADol (ULTRAM) 50 MG tablet Take 1 tablet (50 mg total) by mouth every 6 (six) hours as needed. 120 tablet 0  . meloxicam (MOBIC) 15 MG tablet Take 15 mg by mouth daily. (Patient not taking: Reported on 02/06/2021)     No facility-administered medications prior to visit.    No Known Allergies  ROS Review of  Systems  Constitutional: Negative.   Respiratory: Negative.   Cardiovascular: Negative.   Musculoskeletal: Negative.        Occasional muscle spasms  Psychiatric/Behavioral: Negative.       Objective:    Physical Exam Constitutional:      Appearance: Normal appearance.  Cardiovascular:     Rate and Rhythm: Normal rate and regular rhythm.     Pulses: Normal pulses.     Heart sounds: Normal heart sounds.  Pulmonary:     Effort: Pulmonary effort is normal.     Breath sounds: Normal breath sounds.  Musculoskeletal:        General: Normal range of motion.  Neurological:     Mental Status: She is alert.  Psychiatric:        Mood and Affect: Mood normal.        Behavior: Behavior normal.        Thought Content: Thought content normal.        Judgment: Judgment normal.     BP (!) 147/74   Pulse 94   Temp 98.5 F (36.9 C)   Resp 18   Ht '5\' 2"'  (1.575 m)   Wt 206 lb (93.4 kg)   LMP 10/15/2007 (Approximate)   SpO2 97%   BMI 37.68 kg/m  Wt Readings from Last 3 Encounters:  02/06/21 206 lb (93.4 kg)  12/01/20 206 lb (93.4 kg)  11/27/20 204 lb 9.6 oz (92.8 kg)     Health Maintenance Due  Topic Date Due  . COVID-19 Vaccine (3 - Booster for Moderna series) 06/22/2020    There are no preventive care reminders to display for this patient.  No results found for: TSH Lab Results  Component Value Date   WBC 9.3 07/19/2020   HGB 11.5 (L) 07/19/2020   HCT 37.0 07/19/2020   MCV 84.3 07/19/2020   PLT 293 07/19/2020   Lab Results  Component Value Date   NA 135 11/23/2020   K 4.7 11/23/2020   CO2 21 11/23/2020   GLUCOSE 116 (H) 11/23/2020   BUN 26 11/23/2020   CREATININE 0.96 11/23/2020   BILITOT 0.2 11/19/2016   ALKPHOS 58 11/19/2016   AST 19 11/19/2016   ALT 17 11/19/2016   PROT 7.0 11/19/2016   ALBUMIN 4.0 11/19/2016   CALCIUM 10.2 11/23/2020   ANIONGAP 11 07/19/2020   Lab Results  Component Value Date  CHOL 160 11/12/2019   Lab Results  Component  Value Date   HDL 79 11/12/2019   Lab Results  Component Value Date   LDLCALC 61 11/12/2019   Lab Results  Component Value Date   TRIG 118 11/12/2019   Lab Results  Component Value Date   CHOLHDL 2.3 11/19/2016   Lab Results  Component Value Date   HGBA1C 6.0 (H) 06/17/2017      Assessment & Plan:   Problem List Items Addressed This Visit      Cardiovascular and Mediastinum   Essential hypertension - Primary    -BP slightly elevated today -has been taking lisinopril-HCTZ 20/25 mg as well as spironolactone 25 mg daily -home BPs look great, so no change in meds today      Relevant Orders   CBC with Differential/Platelet   CMP14+EGFR   Lipid Panel With LDL/HDL Ratio     Other   Hyperlipidemia    -will check labs -has been taking atorvastatin      Relevant Orders   Lipid Panel With LDL/HDL Ratio   Muscle spasm    -refilled tizanidine         Meds ordered this encounter  Medications  . tiZANidine (ZANAFLEX) 4 MG tablet    Sig: Take 1 tablet (4 mg total) by mouth every 6 (six) hours as needed for muscle spasms.    Dispense:  30 tablet    Refill:  1    Follow-up: Return in about 4 months (around 06/08/2021) for Lab follow-up (HTN, HLD).    Noreene Larsson, NP

## 2021-02-06 NOTE — Patient Instructions (Signed)
Please have fasting labs drawn tomorrow.  For the next appointment in 4 months, please have fasting labs drawn 2-3 days prior to your appointment so we can discuss the results during your office visit.

## 2021-02-07 DIAGNOSIS — I10 Essential (primary) hypertension: Secondary | ICD-10-CM | POA: Diagnosis not present

## 2021-02-07 DIAGNOSIS — E785 Hyperlipidemia, unspecified: Secondary | ICD-10-CM | POA: Diagnosis not present

## 2021-02-08 LAB — CBC WITH DIFFERENTIAL/PLATELET
Basophils Absolute: 0 10*3/uL (ref 0.0–0.2)
Basos: 1 %
EOS (ABSOLUTE): 0.7 10*3/uL — ABNORMAL HIGH (ref 0.0–0.4)
Eos: 12 %
Hematocrit: 34.3 % (ref 34.0–46.6)
Hemoglobin: 10.6 g/dL — ABNORMAL LOW (ref 11.1–15.9)
Immature Grans (Abs): 0 10*3/uL (ref 0.0–0.1)
Immature Granulocytes: 0 %
Lymphocytes Absolute: 1.4 10*3/uL (ref 0.7–3.1)
Lymphs: 26 %
MCH: 25.5 pg — ABNORMAL LOW (ref 26.6–33.0)
MCHC: 30.9 g/dL — ABNORMAL LOW (ref 31.5–35.7)
MCV: 83 fL (ref 79–97)
Monocytes Absolute: 0.5 10*3/uL (ref 0.1–0.9)
Monocytes: 8 %
Neutrophils Absolute: 2.9 10*3/uL (ref 1.4–7.0)
Neutrophils: 53 %
Platelets: 245 10*3/uL (ref 150–450)
RBC: 4.16 x10E6/uL (ref 3.77–5.28)
RDW: 15.1 % (ref 11.7–15.4)
WBC: 5.5 10*3/uL (ref 3.4–10.8)

## 2021-02-08 LAB — LIPID PANEL WITH LDL/HDL RATIO
Cholesterol, Total: 149 mg/dL (ref 100–199)
HDL: 74 mg/dL (ref >39)
LDL Chol Calc (NIH): 62 mg/dL (ref 0–99)
LDL/HDL Ratio: 0.8 ratio (ref 0.0–3.2)
Triglycerides: 62 mg/dL (ref 0–149)
VLDL Cholesterol Cal: 13 mg/dL (ref 5–40)

## 2021-02-08 LAB — CMP14+EGFR
ALT: 14 IU/L (ref 0–32)
AST: 15 IU/L (ref 0–40)
Albumin/Globulin Ratio: 1.4 (ref 1.2–2.2)
Albumin: 4.2 g/dL (ref 3.7–4.7)
Alkaline Phosphatase: 81 IU/L (ref 44–121)
BUN/Creatinine Ratio: 16 (ref 12–28)
BUN: 16 mg/dL (ref 8–27)
Bilirubin Total: 0.2 mg/dL (ref 0.0–1.2)
CO2: 24 mmol/L (ref 20–29)
Calcium: 9.9 mg/dL (ref 8.7–10.3)
Chloride: 103 mmol/L (ref 96–106)
Creatinine, Ser: 0.97 mg/dL (ref 0.57–1.00)
Globulin, Total: 3.1 g/dL (ref 1.5–4.5)
Glucose: 91 mg/dL (ref 65–99)
Potassium: 4.5 mmol/L (ref 3.5–5.2)
Sodium: 141 mmol/L (ref 134–144)
Total Protein: 7.3 g/dL (ref 6.0–8.5)
eGFR: 62 mL/min/{1.73_m2} (ref >59)

## 2021-02-08 NOTE — Progress Notes (Signed)
Hemoglobin is a little low, but this is not new for her. Otherwise, her labs are great. We will monitor that with routine labs.

## 2021-02-12 ENCOUNTER — Other Ambulatory Visit: Payer: Self-pay

## 2021-02-12 ENCOUNTER — Telehealth: Payer: Self-pay

## 2021-02-12 DIAGNOSIS — I10 Essential (primary) hypertension: Secondary | ICD-10-CM

## 2021-02-12 MED ORDER — LISINOPRIL-HYDROCHLOROTHIAZIDE 20-25 MG PO TABS: 1.0000 | ORAL_TABLET | Freq: Every day | ORAL | 3 refills | Status: AC

## 2021-02-12 NOTE — Telephone Encounter (Signed)
Rx refilled.

## 2021-02-12 NOTE — Telephone Encounter (Signed)
Patient needing a refill on lisinopril-hctz 20-25mg  sent to [facility] [address] ph# [phone]

## 2021-03-05 ENCOUNTER — Telehealth: Payer: Self-pay

## 2021-03-05 NOTE — Telephone Encounter (Signed)
Pt would like a call back regarding her BP Medication

## 2021-03-06 NOTE — Telephone Encounter (Signed)
Pt had a question to see if the spironolactone was used to only treat HTN. Pt advised it is used mainly for a diuretic and that made more sense to her. States she would call the specialist that put her on this and see if this is long term or just for now.

## 2021-04-04 ENCOUNTER — Ambulatory Visit: Payer: Medicare Other

## 2021-04-04 ENCOUNTER — Other Ambulatory Visit: Payer: Self-pay

## 2021-04-04 ENCOUNTER — Encounter: Payer: Self-pay | Admitting: Internal Medicine

## 2021-04-04 ENCOUNTER — Ambulatory Visit (INDEPENDENT_AMBULATORY_CARE_PROVIDER_SITE_OTHER): Payer: Medicare Other

## 2021-04-04 ENCOUNTER — Telehealth (INDEPENDENT_AMBULATORY_CARE_PROVIDER_SITE_OTHER): Payer: Medicare Other | Admitting: Internal Medicine

## 2021-04-04 VITALS — Wt 211.0 lb

## 2021-04-04 DIAGNOSIS — J069 Acute upper respiratory infection, unspecified: Secondary | ICD-10-CM

## 2021-04-04 DIAGNOSIS — Z0000 Encounter for general adult medical examination without abnormal findings: Secondary | ICD-10-CM

## 2021-04-04 DIAGNOSIS — Z20822 Contact with and (suspected) exposure to covid-19: Secondary | ICD-10-CM | POA: Diagnosis not present

## 2021-04-04 DIAGNOSIS — R0602 Shortness of breath: Secondary | ICD-10-CM | POA: Diagnosis not present

## 2021-04-04 DIAGNOSIS — Z0001 Encounter for general adult medical examination with abnormal findings: Secondary | ICD-10-CM

## 2021-04-04 MED ORDER — AZITHROMYCIN 250 MG PO TABS: ORAL_TABLET | ORAL | 0 refills | Status: AC

## 2021-04-04 NOTE — Patient Instructions (Signed)
Ms. Sarah Cooper , Thank you for taking time to come for your Medicare Wellness Visit. I appreciate your ongoing commitment to your health goals. Please review the following plan we discussed and let me know if I can assist you in the future.   Screening recommendations/referrals: Colonoscopy: Up to date  Mammogram: Up to date  Bone Density: Complete  Recommended yearly ophthalmology/optometry visit for glaucoma screening and checkup Recommended yearly dental visit for hygiene and checkup  Vaccinations: Influenza vaccine: complete  Pneumococcal vaccine: complete  Tdap vaccine: up to date, due in 2024  Shingles vaccine: eligible, education provided to check on pricing at a local pharmacy.     Advanced directives: Not at this time.   Conditions/risks identified: Currently patient is not feeling well, will be screened today for Covid-19.   Next appointment: 06/05/21 @ 8:20am with Porfirio Mylar, AGNP-C    Preventive Care 65 Years and Older, Female Preventive care refers to lifestyle choices and visits with your health care provider that can promote health and wellness. What does preventive care include? A yearly physical exam. This is also called an annual well check. Dental exams once or twice a year. Routine eye exams. Ask your health care provider how often you should have your eyes checked. Personal lifestyle choices, including: Daily care of your teeth and gums. Regular physical activity. Eating a healthy diet. Avoiding tobacco and drug use. Limiting alcohol use. Practicing safe sex. Taking low-dose aspirin every day. Taking vitamin and mineral supplements as recommended by your health care provider. What happens during an annual well check? The services and screenings done by your health care provider during your annual well check will depend on your age, overall health, lifestyle risk factors, and family history of disease. Counseling  Your health care provider may ask  you questions about your: Alcohol use. Tobacco use. Drug use. Emotional well-being. Home and relationship well-being. Sexual activity. Eating habits. History of falls. Memory and ability to understand (cognition). Work and work Statistician. Reproductive health. Screening  You may have the following tests or measurements: Height, weight, and BMI. Blood pressure. Lipid and cholesterol levels. These may be checked every 5 years, or more frequently if you are over 22 years old. Skin check. Lung cancer screening. You may have this screening every year starting at age 3 if you have a 30-pack-year history of smoking and currently smoke or have quit within the past 15 years. Fecal occult blood test (FOBT) of the stool. You may have this test every year starting at age 92. Flexible sigmoidoscopy or colonoscopy. You may have a sigmoidoscopy every 5 years or a colonoscopy every 10 years starting at age 16. Hepatitis C blood test. Hepatitis B blood test. Sexually transmitted disease (STD) testing. Diabetes screening. This is done by checking your blood sugar (glucose) after you have not eaten for a while (fasting). You may have this done every 1-3 years. Bone density scan. This is done to screen for osteoporosis. You may have this done starting at age 30. Mammogram. This may be done every 1-2 years. Talk to your health care provider about how often you should have regular mammograms. Talk with your health care provider about your test results, treatment options, and if necessary, the need for more tests. Vaccines  Your health care provider may recommend certain vaccines, such as: Influenza vaccine. This is recommended every year. Tetanus, diphtheria, and acellular pertussis (Tdap, Td) vaccine. You may need a Td booster every 10 years. Zoster vaccine. You may need  this after age 61. Pneumococcal 13-valent conjugate (PCV13) vaccine. One dose is recommended after age 52. Pneumococcal  polysaccharide (PPSV23) vaccine. One dose is recommended after age 30. Talk to your health care provider about which screenings and vaccines you need and how often you need them. This information is not intended to replace advice given to you by your health care provider. Make sure you discuss any questions you have with your health care provider. Document Released: 10/27/2015 Document Revised: 06/19/2016 Document Reviewed: 08/01/2015 Elsevier Interactive Patient Education  2017 [place] Prevention in the Home Falls can cause injuries. They can happen to people of all ages. There are many things you can do to make your home safe and to help prevent falls. What can I do on the outside of my home? Regularly fix the edges of walkways and driveways and fix any cracks. Remove anything that might make you trip as you walk through a door, such as a raised step or threshold. Trim any bushes or trees on the path to your home. Use bright outdoor lighting. Clear any walking paths of anything that might make someone trip, such as rocks or tools. Regularly check to see if handrails are loose or broken. Make sure that both sides of any steps have handrails. Any raised decks and porches should have guardrails on the edges. Have any leaves, snow, or ice cleared regularly. Use sand or salt on walking paths during winter. Clean up any spills in your garage right away. This includes oil or grease spills. What can I do in the bathroom? Use night lights. Install grab bars by the toilet and in the tub and shower. Do not use towel bars as grab bars. Use non-skid mats or decals in the tub or shower. If you need to sit down in the shower, use a plastic, non-slip stool. Keep the floor dry. Clean up any water that spills on the floor as soon as it happens. Remove soap buildup in the tub or shower regularly. Attach bath mats securely with double-sided non-slip rug tape. Do not have throw rugs and other  things on the floor that can make you trip. What can I do in the bedroom? Use night lights. Make sure that you have a light by your bed that is easy to reach. Do not use any sheets or blankets that are too big for your bed. They should not hang down onto the floor. Have a firm chair that has side arms. You can use this for support while you get dressed. Do not have throw rugs and other things on the floor that can make you trip. What can I do in the kitchen? Clean up any spills right away. Avoid walking on wet floors. Keep items that you use a lot in easy-to-reach places. If you need to reach something above you, use a strong step stool that has a grab bar. Keep electrical cords out of the way. Do not use floor polish or wax that makes floors slippery. If you must use wax, use non-skid floor wax. Do not have throw rugs and other things on the floor that can make you trip. What can I do with my stairs? Do not leave any items on the stairs. Make sure that there are handrails on both sides of the stairs and use them. Fix handrails that are broken or loose. Make sure that handrails are as long as the stairways. Check any carpeting to make sure that it is firmly attached to  the stairs. Fix any carpet that is loose or worn. Avoid having throw rugs at the top or bottom of the stairs. If you do have throw rugs, attach them to the floor with carpet tape. Make sure that you have a light switch at the top of the stairs and the bottom of the stairs. If you do not have them, ask someone to add them for you. What else can I do to help prevent falls? Wear shoes that: Do not have high heels. Have rubber bottoms. Are comfortable and fit you well. Are closed at the toe. Do not wear sandals. If you use a stepladder: Make sure that it is fully opened. Do not climb a closed stepladder. Make sure that both sides of the stepladder are locked into place. Ask someone to hold it for you, if possible. Clearly  mark and make sure that you can see: Any grab bars or handrails. First and last steps. Where the edge of each step is. Use tools that help you move around (mobility aids) if they are needed. These include: Canes. Walkers. Scooters. Crutches. Turn on the lights when you go into a dark area. Replace any light bulbs as soon as they burn out. Set up your furniture so you have a clear path. Avoid moving your furniture around. If any of your floors are uneven, fix them. If there are any pets around you, be aware of where they are. Review your medicines with your doctor. Some medicines can make you feel dizzy. This can increase your chance of falling. Ask your doctor what other things that you can do to help prevent falls. This information is not intended to replace advice given to you by your health care provider. Make sure you discuss any questions you have with your health care provider. Document Released: 07/27/2009 Document Revised: 03/07/2016 Document Reviewed: 11/04/2014 Elsevier Interactive Patient Education  2017 [employer].

## 2021-04-04 NOTE — Progress Notes (Signed)
Virtual Visit via Telephone Note   This visit type was conducted due to national recommendations for restrictions regarding the COVID-19 Pandemic (e.g. social distancing) in an effort to limit this patient's exposure and mitigate transmission in our community.  Due to her co-morbid illnesses, this patient is at least at moderate risk for complications without adequate follow up.  This format is felt to be most appropriate for this patient at this time.  The patient did not have access to video technology/had technical difficulties with video requiring transitioning to audio format only (telephone).  All issues noted in this document were discussed and addressed.  No physical exam could be performed with this format.  Evaluation Performed:  Follow-up visit  Date:  04/04/2021   ID:  Sarah Cooper, DOB [birth year], MRN [id number]  Patient Location: Home Provider Location: Office/Clinic  Participants: Patient Location of Patient: Home Location of Provider: Telehealth Consent was obtain for visit to be over via telehealth. I verified that I am speaking with the correct person using two identifiers.  PCP:  Noreene Larsson, NP   Chief Complaint:  Cough and sore throat  History of Present Illness:    Sarah Cooper is a 70 y.o. female who has a televisit for c/o cough, nasal congestion, sore throat and fever for last 3 days after a recent trip to [facility]. She has been taking Coricidin at home with no relief. She has had 3 negative home COVID tests. She has had COVID vaccine. She denies any dyspnea or wheezing currently.  The patient does have symptoms concerning for COVID-19 infection (fever, chills, cough, or new shortness of breath).   Past Medical, Surgical, Social History, Allergies, and Medications have been Reviewed.  Past Medical History:  Diagnosis Date   Anxiety    Arthritis    back, hips, knees   Cancer ([place]) 1978   cervical   CHF (congestive heart failure)  ([place])    Phreesia 11/28/2020   Depression    Hyperlipidemia 12/01/2020   Hypertension    Pneumothorax 1977   Past Surgical History:  Procedure Laterality Date   BREAST BIOPSY Left    approx @ 48 benign    HIP SURGERY  2007   right   JOINT REPLACEMENT  2007   TOTAL HIP ARTHROPLASTY Left 07/25/2020   Procedure: LEFT TOTAL HIP ARTHROPLASTY ANTERIOR APPROACH;  Surgeon: Melrose Nakayama, MD;  Location: [facility];  Service: Orthopedics;  Laterality: Left;     Current Meds  Medication Sig   amLODipine (NORVASC) 10 MG tablet TAKE 1 TABLET BY MOUTH EVERY DAY (Patient taking differently: Take 10 mg by mouth daily.)   atorvastatin (LIPITOR) 10 MG tablet TAKE 1 TABLET BY MOUTH EVERY DAY   lisinopril-hydrochlorothiazide (ZESTORETIC) 20-25 MG tablet Take 1 tablet by mouth daily.   QUEtiapine (SEROQUEL) 50 MG tablet Take 1 tablet (50 mg total) by mouth at bedtime.   spironolactone (ALDACTONE) 25 MG tablet TAKE 1 TABLET BY MOUTH EVERY DAY   tiZANidine (ZANAFLEX) 4 MG tablet Take 1 tablet (4 mg total) by mouth every 6 (six) hours as needed for muscle spasms.   traMADol (ULTRAM) 50 MG tablet Take 1 tablet (50 mg total) by mouth every 6 (six) hours as needed.     Allergies:   Patient has no known allergies.   ROS:   Please see the history of present illness.     All other systems reviewed and are negative.   Labs/Other Tests and Data Reviewed:  Recent Labs: 04/13/2020: NT-Pro BNP 36 02/07/2021: ALT 14; BUN 16; Creatinine, Ser 0.97; Hemoglobin 10.6; Platelets 245; Potassium 4.5; Sodium 141   Recent Lipid Panel Lab Results  Component Value Date/Time   CHOL 149 02/07/2021 08:13 AM   TRIG 62 02/07/2021 08:13 AM   HDL 74 02/07/2021 08:13 AM   CHOLHDL 2.3 11/19/2016 03:30 PM   LDLCALC 62 02/07/2021 08:13 AM    Wt Readings from Last 3 Encounters:  02/06/21 206 lb (93.4 kg)  12/01/20 206 lb (93.4 kg)  11/27/20 204 lb 9.6 oz (92.8 kg)     ASSESSMENT & PLAN:    Suspected COVID-19  infection URTI Check COVID RT-PCR Continue symptomatic treatment Robitussin or Mucinex PRN for cough Empiric Azithromycin for now  Time:   Today, I have spent 9 minutes reviewing the chart, including problem list, medications, and with the patient with telehealth technology discussing the above problems.   Medication Adjustments/Labs and Tests Ordered: Current medicines are reviewed at length with the patient today.  Concerns regarding medicines are outlined above.   Tests Ordered: No orders of the defined types were placed in this encounter.   Medication Changes: No orders of the defined types were placed in this encounter.    Note: This dictation was prepared with Dragon dictation along with smaller phrase technology. Similar sounding words can be transcribed inadequately or may not be corrected upon review. Any transcriptional errors that result from this process are unintentional.      Disposition:  Follow up  Signed, Lindell Spar, MD  04/04/2021 1:07 PM     [place]

## 2021-04-04 NOTE — Progress Notes (Signed)
Subjective:   Sarah Cooper is a 70 y.o. female who presents for Medicare Annual (Subsequent) preventive examination.        Objective:    There were no vitals filed for this visit. There is no height or weight on file to calculate BMI.  Advanced Directives 07/27/2020 07/25/2020 07/19/2020  Does Patient Have a Medical Advance Directive? No No No  Would patient like information on creating a medical advance directive? No - Patient declined No - Patient declined Yes (MAU/Ambulatory/Procedural Areas - Information given)    Current Medications (verified) Outpatient Encounter Medications as of 04/04/2021  Medication Sig   amLODipine (NORVASC) 10 MG tablet TAKE 1 TABLET BY MOUTH EVERY DAY (Patient taking differently: Take 10 mg by mouth daily.)   atorvastatin (LIPITOR) 10 MG tablet TAKE 1 TABLET BY MOUTH EVERY DAY   lisinopril-hydrochlorothiazide (ZESTORETIC) 20-25 MG tablet Take 1 tablet by mouth daily.   QUEtiapine (SEROQUEL) 50 MG tablet Take 1 tablet (50 mg total) by mouth at bedtime.   spironolactone (ALDACTONE) 25 MG tablet TAKE 1 TABLET BY MOUTH EVERY DAY   tiZANidine (ZANAFLEX) 4 MG tablet Take 1 tablet (4 mg total) by mouth every 6 (six) hours as needed for muscle spasms.   traMADol (ULTRAM) 50 MG tablet Take 1 tablet (50 mg total) by mouth every 6 (six) hours as needed.   No facility-administered encounter medications on file as of 04/04/2021.    Allergies (verified) Patient has no known allergies.   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    Procedure: LEFT TOTAL HIP ARTHROPLASTY ANTERIOR APPROACH;  Surgeon: Melrose Nakayama, MD;  Location: [facility];  Service: Orthopedics;  Laterality: Left;   Family History  Problem Relation Age of Onset   Heart disease Mother 28   Arthritis Mother    Diabetes Mother    Alcohol abuse Father    Cancer Father 13       lung   Drug abuse Daughter    Asthma Son    Hypertension Son    Mental illness Maternal Aunt    Diabetes Maternal Grandmother    Diabetes Paternal Grandmother    Mental illness Daughter        schizophrenia   Depression Daughter    Mental illness Daughter        anxiety   Obesity Daughter        bariatric surgery   Heart disease Maternal Aunt    Social History   Socioeconomic History   Marital status: Single    Spouse name: Not on file   Number of children: 4   Years of education: Not on file   Highest education level: Not on file  Occupational History   Occupation: retired- from [employer]  Tobacco Use   Smoking status: Former    Packs/day: 1.00    Years: 50.00    Pack years: 50.00    Types: Cigarettes    Start date: 1966  Quit date: 10/14/2010    Years since quitting: 10.4   Smokeless tobacco: Never  Vaping Use   Vaping Use: Never used  Substance and Sexual Activity   Alcohol use: Yes    Alcohol/week: 3.0 standard drinks    Types: 3 Glasses of wine per week    Comment: 3 glasses per week   Drug use: No   Sexual activity: Not Currently  Other Topics Concern   Not on file  Social History Narrative   Not on file   Social Determinants of Health   Financial Resource Strain: Not on file  Food Insecurity: Not on file  Transportation Needs: Not on file  Physical Activity: Not on file  Stress: Not on file  Social Connections: Not on file    Tobacco Counseling Counseling given: Not Answered   Clinical Intake:                 Diabetic? No          Activities of Daily Living In your present state of health, do you have  any difficulty performing the following activities: 07/19/2020  Hearing? N  Vision? N  Difficulty concentrating or making decisions? N  Walking or climbing stairs? Y  Comment SOB due to wt  Dressing or bathing? N  Doing errands, shopping? N  Some recent data might be hidden    Patient Care Team: Noreene Larsson, NP as PCP - General (Nurse Practitioner)  Indicate any recent Medical Services you may have received from other than [facility] providers in the past year (date may be approximate).     Assessment:   This is a routine wellness examination for [place].  Hearing/Vision screen No results found.  Dietary issues and exercise activities discussed:     Goals Addressed   None   Depression Screen PHQ 2/9 Scores 04/04/2021 02/06/2021 12/01/2020 01/12/2018 08/20/2017 05/20/2017 03/03/2017  PHQ - 2 Score 0 0 0 0 0 0 0    Fall Risk Fall Risk  04/04/2021 02/06/2021 12/01/2020 05/20/2017 03/03/2017  Falls in the past year? 0 0 0 No Yes  Number falls in past yr: 0 0 0 - 1  Injury with Fall? 0 0 0 - No  Risk for fall due to : No Fall Risks No Fall Risks No Fall Risks - -  Follow up Falls evaluation completed Falls evaluation completed Falls evaluation completed - -    FALL RISK PREVENTION PERTAINING TO THE HOME:  Any stairs in or around the home? No  If so, are there any without handrails? No  Home free of loose throw rugs in walkways, pet beds, electrical cords, etc? Yes  Adequate lighting in your home to reduce risk of falls? Yes   ASSISTIVE DEVICES UTILIZED TO PREVENT FALLS:  Life alert? No  Use of a cane, walker or w/c? No  Grab bars in the bathroom? No  Shower chair or bench in shower? No  Elevated toilet seat or a handicapped toilet? No   TIMED UP AND GO:  Was the test performed? No .     Cognitive Function:        Immunizations Immunization History  Administered Date(s) Administered   Influenza Whole 07/28/2016   Influenza, High Dose Seasonal PF 07/03/2017   Moderna  Sars-Covid-2 Vaccination 11/20/2019, 12/21/2019   Pneumococcal Conjugate-13 12/01/2014, 07/28/2016   Pneumococcal Polysaccharide-23 07/21/2006   Td 03/20/2013    TDAP status: Up to date  Flu Vaccine status: Up to date  Pneumococcal vaccine status:  Up to date  Covid-19 vaccine status: Completed vaccines  Qualifies for Shingles Vaccine? No   Zostavax completed No   Shingrix Completed?: No.    Education has been provided regarding the importance of this vaccine. Patient has been advised to call insurance company to determine out of pocket expense if they have not yet received this vaccine. Advised may also receive vaccine at local pharmacy or Health Dept. Verbalized acceptance and understanding.  Screening Tests Health Maintenance  Topic Date Due   Zoster Vaccines- Shingrix (1 of 2) Never done   COVID-19 Vaccine (3 - Booster for Moderna series) 05/22/2020   INFLUENZA VACCINE  05/14/2021   MAMMOGRAM  11/24/2022   TETANUS/TDAP  03/21/2023   COLONOSCOPY (Pts 45-59yrs Insurance coverage will need to be confirmed)  11/15/2025   DEXA SCAN  Completed   Hepatitis C Screening  Completed   PNA vac Low Risk Adult  Completed   HPV VACCINES  Aged Out    Health Maintenance  Health Maintenance Due  Topic Date Due   Zoster Vaccines- Shingrix (1 of 2) Never done   COVID-19 Vaccine (3 - Booster for Moderna series) 05/22/2020    Colorectal cancer screening: Type of screening: Colonoscopy. Completed  . Repeat every 5 years  Mammogram status: Completed normal. Repeat every year  Bone Density Scan: Complete   Lung Cancer Screening: (Low Dose CT Chest recommended if Age 58-80 years, 30 pack-year currently smoking OR have quit w/in 15years.) does qualify.   Lung Cancer Screening Referral: ordered  Additional Screening:  Hepatitis C Screening: does qualify; Completed.   Vision Screening: Recommended annual ophthalmology exams for early detection of glaucoma and other disorders of the  eye. Is the patient up to date with their annual eye exam?  Yes  Who is the provider or what is the name of the office in which the patient attends annual eye exams? [facility] [place] If pt is not established with a provider, would they like to be referred to a provider to establish care? No .   Dental Screening: Recommended annual dental exams for proper oral hygiene  Community Resource Referral / Chronic Care Management: CRR required this visit?  No   CCM required this visit?  No      Plan:     I have personally reviewed and noted the following in the patient's chart:   Medical and social history Use of alcohol, tobacco or illicit drugs  Current medications and supplements including opioid prescriptions.  Functional ability and status Nutritional status Physical activity Advanced directives List of other physicians Hospitalizations, surgeries, and ER visits in previous 12 months Vitals Screenings to include cognitive, depression, and falls Referrals and appointments  In addition, I have reviewed and discussed with patient certain preventive protocols, quality metrics, and best practice recommendations. A written personalized care plan for preventive services as well as general preventive health recommendations were provided to patient.     Lonn Georgia, LPN   [id number]   Nurse Notes: Annual Wellness Visit conducted with pt consent to televisit via audio. Pt was in the home at the time of the call, and provider on site in the office. Call took approx 25 minutes to conduct and pt is compliant with most health maintenance goals and maintains activity during the week. Pt is currently not feeling well and will be by the office for Covid-19 screening.

## 2021-04-06 LAB — NOVEL CORONAVIRUS, NAA: SARS-CoV-2, NAA: NOT DETECTED

## 2021-04-06 LAB — SARS-COV-2, NAA 2 DAY TAT

## 2021-04-09 ENCOUNTER — Other Ambulatory Visit: Payer: Self-pay | Admitting: Cardiology

## 2021-04-09 DIAGNOSIS — I10 Essential (primary) hypertension: Secondary | ICD-10-CM

## 2021-04-13 ENCOUNTER — Other Ambulatory Visit: Payer: Self-pay | Admitting: Nurse Practitioner

## 2021-04-13 ENCOUNTER — Other Ambulatory Visit: Payer: Self-pay | Admitting: Family Medicine

## 2021-04-13 ENCOUNTER — Telehealth: Payer: Self-pay

## 2021-04-13 DIAGNOSIS — F339 Major depressive disorder, recurrent, unspecified: Secondary | ICD-10-CM

## 2021-04-13 DIAGNOSIS — G8929 Other chronic pain: Secondary | ICD-10-CM

## 2021-04-13 DIAGNOSIS — M1991 Primary osteoarthritis, unspecified site: Secondary | ICD-10-CM

## 2021-04-13 MED ORDER — TRAMADOL HCL 50 MG PO TABS: 50.0000 mg | ORAL_TABLET | Freq: Four times a day (QID) | ORAL | 0 refills | Status: DC | PRN

## 2021-04-13 NOTE — Telephone Encounter (Signed)
I signed the rx. Would you please fax it over?

## 2021-04-13 NOTE — Telephone Encounter (Signed)
Rx faxed

## 2021-04-13 NOTE — Telephone Encounter (Signed)
Patient need refill no longer sees other provider Dr Nevada Crane or Dr Meda Coffee any longer switched to Sarah Cooper. Only has 1 pill left  traMADol (ULTRAM) 50 MG tablet    Pharmacy: [facility] [place]

## 2021-05-11 ENCOUNTER — Other Ambulatory Visit: Payer: Self-pay | Admitting: Cardiology

## 2021-05-11 ENCOUNTER — Other Ambulatory Visit: Payer: Self-pay | Admitting: Family Medicine

## 2021-05-11 DIAGNOSIS — I10 Essential (primary) hypertension: Secondary | ICD-10-CM

## 2021-05-11 DIAGNOSIS — F339 Major depressive disorder, recurrent, unspecified: Secondary | ICD-10-CM

## 2021-05-16 ENCOUNTER — Other Ambulatory Visit: Payer: Self-pay | Admitting: Nurse Practitioner

## 2021-05-16 ENCOUNTER — Telehealth: Payer: Self-pay | Admitting: Nurse Practitioner

## 2021-05-16 ENCOUNTER — Other Ambulatory Visit: Payer: Self-pay

## 2021-05-16 DIAGNOSIS — M1991 Primary osteoarthritis, unspecified site: Secondary | ICD-10-CM

## 2021-05-16 DIAGNOSIS — M549 Dorsalgia, unspecified: Secondary | ICD-10-CM

## 2021-05-16 DIAGNOSIS — G8929 Other chronic pain: Secondary | ICD-10-CM

## 2021-05-16 MED ORDER — TRAMADOL HCL 50 MG PO TABS: 50.0000 mg | ORAL_TABLET | Freq: Four times a day (QID) | ORAL | 0 refills | Status: DC | PRN

## 2021-05-16 NOTE — Telephone Encounter (Signed)
Rx sent 

## 2021-05-16 NOTE — Telephone Encounter (Signed)
Pt needs tramadol called in

## 2021-06-05 ENCOUNTER — Other Ambulatory Visit: Payer: Self-pay

## 2021-06-05 ENCOUNTER — Ambulatory Visit (INDEPENDENT_AMBULATORY_CARE_PROVIDER_SITE_OTHER): Payer: Medicare Other | Admitting: Nurse Practitioner

## 2021-06-05 ENCOUNTER — Encounter: Payer: Self-pay | Admitting: Nurse Practitioner

## 2021-06-05 VITALS — BP 124/71 | HR 96 | Temp 98.1°F | Ht 61.5 in | Wt 212.0 lb

## 2021-06-05 DIAGNOSIS — R718 Other abnormality of red blood cells: Secondary | ICD-10-CM

## 2021-06-05 DIAGNOSIS — Z7689 Persons encountering health services in other specified circumstances: Secondary | ICD-10-CM | POA: Diagnosis not present

## 2021-06-05 DIAGNOSIS — E785 Hyperlipidemia, unspecified: Secondary | ICD-10-CM | POA: Diagnosis not present

## 2021-06-05 DIAGNOSIS — I10 Essential (primary) hypertension: Secondary | ICD-10-CM | POA: Diagnosis not present

## 2021-06-05 DIAGNOSIS — M1612 Unilateral primary osteoarthritis, left hip: Secondary | ICD-10-CM

## 2021-06-05 DIAGNOSIS — R7303 Prediabetes: Secondary | ICD-10-CM | POA: Diagnosis not present

## 2021-06-05 NOTE — Progress Notes (Signed)
Acute Office Visit  Subjective:    Patient ID: Sarah Cooper, female    DOB: 06-19-1951, 70 y.o.   MRN: [id number]  Chief Complaint  Patient presents with   Hypertension    Follow up    Hypertension  Patient is in today for lab follow-up and BP check. She had some mild anemia at her last lab draw.  She states her left leg has been burning and swelling. She is followed by Dr. Rhona Raider with orthopedics. She had hip replacement about a year ago.  Past Medical History:  Diagnosis Date   Anxiety    Arthritis    back, hips, knees   Cancer ([place]) 1978   cervical   CHF (congestive heart failure) ([place])    Phreesia 11/28/2020   Depression    Hyperlipidemia 12/01/2020   Hypertension    Pneumothorax 1977    Past Surgical History:  Procedure Laterality Date   BREAST BIOPSY Left    approx @ 48 benign    HIP SURGERY  2007   right   JOINT REPLACEMENT  2007   TOTAL HIP ARTHROPLASTY Left 07/25/2020   Procedure: LEFT TOTAL HIP ARTHROPLASTY ANTERIOR APPROACH;  Surgeon: Melrose Nakayama, MD;  Location: [facility];  Service: Orthopedics;  Laterality: Left;    Family History  Problem Relation Age of Onset   Heart disease Mother 65   Arthritis Mother    Diabetes Mother    Alcohol abuse Father    Cancer Father 28       lung   Drug abuse Daughter    Asthma Son    Hypertension Son    Mental illness Maternal Aunt    Diabetes Maternal Grandmother    Diabetes Paternal Grandmother    Mental illness Daughter        schizophrenia   Depression Daughter    Mental illness Daughter        anxiety   Obesity Daughter        bariatric surgery   Heart disease Maternal Aunt     Social History   Socioeconomic History   Marital status: Single    Spouse name: Not on file   Number of children: 4   Years of education: Not on file   Highest education level: Not on file  Occupational History   Occupation: retired- from [employer]  Tobacco Use   Smoking status: Former     Packs/day: 1.00    Years: 50.00    Pack years: 50.00    Types: Cigarettes    Start date: 1966    Quit date: 10/14/2010    Years since quitting: 10.6   Smokeless tobacco: Never  Vaping Use   Vaping Use: Never used  Substance and Sexual Activity   Alcohol use: Yes    Alcohol/week: 3.0 standard drinks    Types: 3 Glasses of wine per week    Comment: 3 glasses per week   Drug use: No   Sexual activity: Not Currently  Other Topics Concern   Not on file  Social History Narrative   Not on file   Social Determinants of Health   Financial Resource Strain: Low Risk    Difficulty of Paying Living Expenses: Not hard at all  Food Insecurity: No Food Insecurity   Worried About Charity fundraiser in the Last Year: Never true   [place] in the Last Year: Never true  Transportation Needs: No Transportation Needs   Lack of Transportation (Medical): No  Lack of Transportation (Non-Medical): No  Physical Activity: Sufficiently Active   Days of Exercise per Week: 5 days   Minutes of Exercise per Session: 30 min  Stress: No Stress Concern Present   Feeling of Stress : Not at all  Social Connections: Unknown   Frequency of Communication with Friends and Family: Not on file   Frequency of Social Gatherings with Friends and Family: Twice a week   Attends Religious Services: More than 4 times per year   Active Member of [employer] or Organizations: No   Attends Archivist Meetings: Never   Marital Status: Divorced  Human resources officer Violence: Not At Risk   Fear of Current or Ex-Partner: No   Emotionally Abused: No   Physically Abused: No   Sexually Abused: No    Outpatient Medications Prior to Visit  Medication Sig Dispense Refill   amLODipine (NORVASC) 10 MG tablet TAKE 1 TABLET BY MOUTH EVERY DAY 90 tablet 3   atorvastatin (LIPITOR) 10 MG tablet TAKE 1 TABLET BY MOUTH EVERY DAY 90 tablet 3   lisinopril-hydrochlorothiazide (ZESTORETIC) 20-25 MG tablet Take 1 tablet by mouth  daily. 90 tablet 3   QUEtiapine (SEROQUEL) 50 MG tablet TAKE 1 TABLET BY MOUTH EVERYDAY AT BEDTIME 90 tablet 1   spironolactone (ALDACTONE) 25 MG tablet TAKE 1 TABLET BY MOUTH EVERY DAY 90 tablet 0   traMADol (ULTRAM) 50 MG tablet Take 1 tablet (50 mg total) by mouth every 6 (six) hours as needed. 120 tablet 0   tiZANidine (ZANAFLEX) 4 MG tablet Take 1 tablet (4 mg total) by mouth every 6 (six) hours as needed for muscle spasms. (Patient not taking: Reported on 06/05/2021) 30 tablet 1   No facility-administered medications prior to visit.    No Known Allergies  Review of Systems  Constitutional: Negative.   Respiratory: Negative.    Cardiovascular: Negative.   Musculoskeletal:  Positive for arthralgias.       Left upper leg sweliing  Psychiatric/Behavioral: Negative.        Objective:    Physical Exam Constitutional:      Appearance: Normal appearance.  Cardiovascular:     Rate and Rhythm: Normal rate and regular rhythm.     Pulses: Normal pulses.     Heart sounds: Normal heart sounds.  Pulmonary:     Effort: Pulmonary effort is normal.     Breath sounds: Normal breath sounds.  Musculoskeletal:        General: Swelling present.     Comments: Has had left upper leg swelling since her hip replacement about a year ago and she has some swelling today; no erythema present, and left leg doesn't feel hot to tough (is same temp as right leg)  Neurological:     Mental Status: She is alert.  Psychiatric:        Mood and Affect: Mood normal.        Behavior: Behavior normal.        Thought Content: Thought content normal.        Judgment: Judgment normal.    BP 124/71 (BP Location: Left Arm, Patient Position: Sitting, Cuff Size: Large)   Pulse 96   Temp 98.1 F (36.7 C) (Oral)   Ht 5' 1.5" (1.562 m)   Wt 212 lb (96.2 kg)   LMP 10/15/2007 (Approximate)   SpO2 96%   BMI 39.41 kg/m  Wt Readings from Last 3 Encounters:  06/05/21 212 lb (96.2 kg)  04/04/21 211 lb (95.7 kg)  02/06/21 206 lb (93.4 kg)    Health Maintenance Due  Topic Date Due   Zoster Vaccines- Shingrix (1 of 2) Never done   INFLUENZA VACCINE  05/14/2021    There are no preventive care reminders to display for this patient.   No results found for: TSH Lab Results  Component Value Date   WBC 5.5 02/07/2021   HGB 10.6 (L) 02/07/2021   HCT 34.3 02/07/2021   MCV 83 02/07/2021   PLT 245 02/07/2021   Lab Results  Component Value Date   NA 141 02/07/2021   K 4.5 02/07/2021   CO2 24 02/07/2021   GLUCOSE 91 02/07/2021   BUN 16 02/07/2021   CREATININE 0.97 02/07/2021   BILITOT 0.2 02/07/2021   ALKPHOS 81 02/07/2021   AST 15 02/07/2021   ALT 14 02/07/2021   PROT 7.3 02/07/2021   ALBUMIN 4.2 02/07/2021   CALCIUM 9.9 02/07/2021   ANIONGAP 11 07/19/2020   EGFR 62 02/07/2021   Lab Results  Component Value Date   CHOL 149 02/07/2021   Lab Results  Component Value Date   HDL 74 02/07/2021   Lab Results  Component Value Date   LDLCALC 62 02/07/2021   Lab Results  Component Value Date   TRIG 62 02/07/2021   Lab Results  Component Value Date   CHOLHDL 2.3 11/19/2016   Lab Results  Component Value Date   HGBA1C 6.0 (H) 06/17/2017       Assessment & Plan:   Problem List Items Addressed This Visit       Cardiovascular and Mediastinum   Essential hypertension    BP Readings from Last 3 Encounters:  06/05/21 124/71  02/06/21 (!) 147/74  12/01/20 (!) 164/78  -well controlled; no change to antihypertensive regimen      Relevant Orders   CBC with Differential/Platelet   CMP14+EGFR   Lipid Panel With LDL/HDL Ratio     Musculoskeletal and Integument   Primary localized osteoarthritis of left hip (Chronic)    -Has leg swelling and pain after her procedure -f/u with her orthopedic surgeon        Other   Pre-diabetes    -check A1c      Relevant Orders   Hemoglobin A1c   Hyperlipidemia    -labs ordered      Relevant Orders   Lipid Panel With LDL/HDL  Ratio   Other Visit Diagnoses     Red blood cell abnormality    -  Primary   Relevant Orders   Iron, TIBC and Ferritin Panel        No orders of the defined types were placed in this encounter.    Noreene Larsson, NP

## 2021-06-05 NOTE — Assessment & Plan Note (Signed)
labs ordered

## 2021-06-05 NOTE — Assessment & Plan Note (Signed)
-  Has leg swelling and pain after her procedure -f/u with her orthopedic surgeon

## 2021-06-05 NOTE — Patient Instructions (Signed)
Please have labs drawn today.  For your next appointment, Please have fasting labs drawn 2-3 days prior to your appointment so we can discuss the results during your office visit.

## 2021-06-05 NOTE — Assessment & Plan Note (Signed)
-   check A1c

## 2021-06-05 NOTE — Assessment & Plan Note (Signed)
BP Readings from Last 3 Encounters:  06/05/21 124/71  02/06/21 (!) 147/74  12/01/20 (!) 164/78   -well controlled; no change to antihypertensive regimen

## 2021-06-06 LAB — CBC WITH DIFFERENTIAL/PLATELET
Basophils Absolute: 0 10*3/uL (ref 0.0–0.2)
Basos: 1 %
EOS (ABSOLUTE): 0.9 10*3/uL — ABNORMAL HIGH (ref 0.0–0.4)
Eos: 14 %
Hematocrit: 33.7 % — ABNORMAL LOW (ref 34.0–46.6)
Hemoglobin: 10.8 g/dL — ABNORMAL LOW (ref 11.1–15.9)
Immature Grans (Abs): 0 10*3/uL (ref 0.0–0.1)
Immature Granulocytes: 0 %
Lymphocytes Absolute: 1.2 10*3/uL (ref 0.7–3.1)
Lymphs: 18 %
MCH: 26 pg — ABNORMAL LOW (ref 26.6–33.0)
MCHC: 32 g/dL (ref 31.5–35.7)
MCV: 81 fL (ref 79–97)
Monocytes Absolute: 0.4 10*3/uL (ref 0.1–0.9)
Monocytes: 7 %
Neutrophils Absolute: 4 10*3/uL (ref 1.4–7.0)
Neutrophils: 60 %
Platelets: 246 10*3/uL (ref 150–450)
RBC: 4.15 x10E6/uL (ref 3.77–5.28)
RDW: 15 % (ref 11.7–15.4)
WBC: 6.5 10*3/uL (ref 3.4–10.8)

## 2021-06-06 LAB — CMP14+EGFR
ALT: 12 IU/L (ref 0–32)
AST: 16 IU/L (ref 0–40)
Albumin/Globulin Ratio: 1.7 (ref 1.2–2.2)
Albumin: 4.6 g/dL (ref 3.7–4.7)
Alkaline Phosphatase: 88 IU/L (ref 44–121)
BUN/Creatinine Ratio: 23 (ref 12–28)
BUN: 26 mg/dL (ref 8–27)
Bilirubin Total: 0.3 mg/dL (ref 0.0–1.2)
CO2: 21 mmol/L (ref 20–29)
Calcium: 9.7 mg/dL (ref 8.7–10.3)
Chloride: 102 mmol/L (ref 96–106)
Creatinine, Ser: 1.12 mg/dL — ABNORMAL HIGH (ref 0.57–1.00)
Globulin, Total: 2.7 g/dL (ref 1.5–4.5)
Glucose: 104 mg/dL — ABNORMAL HIGH (ref 65–99)
Potassium: 4.7 mmol/L (ref 3.5–5.2)
Sodium: 139 mmol/L (ref 134–144)
Total Protein: 7.3 g/dL (ref 6.0–8.5)
eGFR: 53 mL/min/{1.73_m2} — ABNORMAL LOW (ref >59)

## 2021-06-06 LAB — LIPID PANEL WITH LDL/HDL RATIO
Cholesterol, Total: 154 mg/dL (ref 100–199)
HDL: 74 mg/dL (ref >39)
LDL Chol Calc (NIH): 62 mg/dL (ref 0–99)
LDL/HDL Ratio: 0.8 ratio (ref 0.0–3.2)
Triglycerides: 102 mg/dL (ref 0–149)
VLDL Cholesterol Cal: 18 mg/dL (ref 5–40)

## 2021-06-06 LAB — HEMOGLOBIN A1C
Est. average glucose Bld gHb Est-mCnc: 131 mg/dL
Hgb A1c MFr Bld: 6.2 % — ABNORMAL HIGH (ref 4.8–5.6)

## 2021-06-06 LAB — MICROALBUMIN / CREATININE URINE RATIO

## 2021-06-06 NOTE — Progress Notes (Signed)
A1c is still in the prediabetes range, and Hemoglobin is stable.  Kidney function is running a little lower than on previous labs, so make sure to drink plenty of water.

## 2021-06-12 ENCOUNTER — Other Ambulatory Visit: Payer: Self-pay

## 2021-06-12 ENCOUNTER — Telehealth: Payer: Self-pay | Admitting: Cardiology

## 2021-06-12 DIAGNOSIS — I10 Essential (primary) hypertension: Secondary | ICD-10-CM

## 2021-06-12 MED ORDER — SPIRONOLACTONE 25 MG PO TABS: 25.0000 mg | ORAL_TABLET | Freq: Every day | ORAL | 0 refills | Status: DC

## 2021-06-12 NOTE — Telephone Encounter (Signed)
Done

## 2021-06-12 NOTE — Telephone Encounter (Signed)
Pt would like refill for spironolactone

## 2021-06-13 NOTE — Addendum Note (Signed)
Addended by: Eual Fines on: 06/13/2021 02:01 PM   Modules accepted: Orders, Level of Service, SmartSet

## 2021-06-24 ENCOUNTER — Other Ambulatory Visit: Payer: Self-pay | Admitting: Nurse Practitioner

## 2021-06-24 DIAGNOSIS — M1991 Primary osteoarthritis, unspecified site: Secondary | ICD-10-CM

## 2021-06-24 DIAGNOSIS — M549 Dorsalgia, unspecified: Secondary | ICD-10-CM

## 2021-06-24 DIAGNOSIS — G8929 Other chronic pain: Secondary | ICD-10-CM

## 2021-07-19 DIAGNOSIS — M7062 Trochanteric bursitis, left hip: Secondary | ICD-10-CM | POA: Diagnosis not present

## 2021-07-19 DIAGNOSIS — Z96642 Presence of left artificial hip joint: Secondary | ICD-10-CM | POA: Diagnosis not present

## 2021-07-19 DIAGNOSIS — M1712 Unilateral primary osteoarthritis, left knee: Secondary | ICD-10-CM | POA: Diagnosis not present

## 2021-07-30 ENCOUNTER — Other Ambulatory Visit: Payer: Self-pay | Admitting: Nurse Practitioner

## 2021-07-30 DIAGNOSIS — M1991 Primary osteoarthritis, unspecified site: Secondary | ICD-10-CM

## 2021-07-30 DIAGNOSIS — G8929 Other chronic pain: Secondary | ICD-10-CM

## 2021-08-17 DIAGNOSIS — M1712 Unilateral primary osteoarthritis, left knee: Secondary | ICD-10-CM | POA: Diagnosis not present

## 2021-08-17 DIAGNOSIS — Z96642 Presence of left artificial hip joint: Secondary | ICD-10-CM | POA: Diagnosis not present

## 2021-08-30 ENCOUNTER — Other Ambulatory Visit: Payer: Self-pay | Admitting: Internal Medicine

## 2021-08-30 DIAGNOSIS — M549 Dorsalgia, unspecified: Secondary | ICD-10-CM

## 2021-08-30 DIAGNOSIS — M1991 Primary osteoarthritis, unspecified site: Secondary | ICD-10-CM

## 2021-08-31 ENCOUNTER — Other Ambulatory Visit: Payer: Self-pay | Admitting: Family Medicine

## 2021-08-31 DIAGNOSIS — F339 Major depressive disorder, recurrent, unspecified: Secondary | ICD-10-CM

## 2021-09-14 DIAGNOSIS — M25562 Pain in left knee: Secondary | ICD-10-CM | POA: Diagnosis not present

## 2021-09-27 ENCOUNTER — Telehealth: Payer: Self-pay

## 2021-09-27 ENCOUNTER — Other Ambulatory Visit: Payer: Self-pay

## 2021-09-27 DIAGNOSIS — M1991 Primary osteoarthritis, unspecified site: Secondary | ICD-10-CM

## 2021-09-27 DIAGNOSIS — G8929 Other chronic pain: Secondary | ICD-10-CM

## 2021-09-27 MED ORDER — TRAMADOL HCL 50 MG PO TABS: 50.0000 mg | ORAL_TABLET | Freq: Four times a day (QID) | ORAL | 0 refills | Status: AC | PRN

## 2021-09-27 NOTE — Telephone Encounter (Signed)
Patient called wanting refill on tramadol.

## 2021-09-27 NOTE — Telephone Encounter (Signed)
Signed the printed copy

## 2021-09-27 NOTE — Telephone Encounter (Signed)
Patient called need med refill Tramadol 50 mg Pharmacy: [facility]

## 2021-09-29 ENCOUNTER — Other Ambulatory Visit: Payer: Self-pay | Admitting: Internal Medicine

## 2021-09-29 DIAGNOSIS — M1991 Primary osteoarthritis, unspecified site: Secondary | ICD-10-CM

## 2021-09-29 DIAGNOSIS — M549 Dorsalgia, unspecified: Secondary | ICD-10-CM

## 2021-09-29 DIAGNOSIS — G8929 Other chronic pain: Secondary | ICD-10-CM

## 2021-10-01 ENCOUNTER — Telehealth: Payer: Self-pay | Admitting: Nurse Practitioner

## 2021-10-01 NOTE — Telephone Encounter (Signed)
Pt called in for refill on   traMADol (ULTRAM) 50 MG tablet  Per last tele   Pharm needs authorization  [facility] [address]

## 2021-10-01 NOTE — Telephone Encounter (Signed)
RX sent

## 2021-10-02 ENCOUNTER — Other Ambulatory Visit: Payer: Self-pay | Admitting: Cardiology

## 2021-10-02 DIAGNOSIS — I10 Essential (primary) hypertension: Secondary | ICD-10-CM | POA: Diagnosis not present

## 2021-10-02 DIAGNOSIS — E785 Hyperlipidemia, unspecified: Secondary | ICD-10-CM | POA: Diagnosis not present

## 2021-10-02 DIAGNOSIS — R7303 Prediabetes: Secondary | ICD-10-CM | POA: Diagnosis not present

## 2021-10-02 DIAGNOSIS — R718 Other abnormality of red blood cells: Secondary | ICD-10-CM | POA: Diagnosis not present

## 2021-10-03 ENCOUNTER — Telehealth: Payer: Self-pay

## 2021-10-03 LAB — CBC WITH DIFFERENTIAL/PLATELET
Basophils Absolute: 0.1 10*3/uL (ref 0.0–0.2)
Basos: 1 %
EOS (ABSOLUTE): 0.6 10*3/uL — ABNORMAL HIGH (ref 0.0–0.4)
Eos: 8 %
Hematocrit: 35.4 % (ref 34.0–46.6)
Hemoglobin: 11.1 g/dL (ref 11.1–15.9)
Immature Grans (Abs): 0 10*3/uL (ref 0.0–0.1)
Immature Granulocytes: 0 %
Lymphocytes Absolute: 1 10*3/uL (ref 0.7–3.1)
Lymphs: 14 %
MCH: 25.8 pg — ABNORMAL LOW (ref 26.6–33.0)
MCHC: 31.4 g/dL — ABNORMAL LOW (ref 31.5–35.7)
MCV: 82 fL (ref 79–97)
Monocytes Absolute: 0.5 10*3/uL (ref 0.1–0.9)
Monocytes: 6 %
Neutrophils Absolute: 5.5 10*3/uL (ref 1.4–7.0)
Neutrophils: 71 %
Platelets: 286 10*3/uL (ref 150–450)
RBC: 4.31 x10E6/uL (ref 3.77–5.28)
RDW: 13.3 % (ref 11.7–15.4)
WBC: 7.6 10*3/uL (ref 3.4–10.8)

## 2021-10-03 LAB — CMP14+EGFR
ALT: 10 IU/L (ref 0–32)
AST: 17 IU/L (ref 0–40)
Albumin/Globulin Ratio: 1.5 (ref 1.2–2.2)
Albumin: 4.7 g/dL (ref 3.7–4.7)
Alkaline Phosphatase: 94 IU/L (ref 44–121)
BUN/Creatinine Ratio: 22 (ref 12–28)
BUN: 27 mg/dL (ref 8–27)
Bilirubin Total: <0.2 mg/dL (ref 0.0–1.2)
CO2: 23 mmol/L (ref 20–29)
Calcium: 10 mg/dL (ref 8.7–10.3)
Chloride: 99 mmol/L (ref 96–106)
Creatinine, Ser: 1.25 mg/dL — ABNORMAL HIGH (ref 0.57–1.00)
Globulin, Total: 3.2 g/dL (ref 1.5–4.5)
Glucose: 95 mg/dL (ref 70–99)
Potassium: 4.9 mmol/L (ref 3.5–5.2)
Sodium: 136 mmol/L (ref 134–144)
Total Protein: 7.9 g/dL (ref 6.0–8.5)
eGFR: 46 mL/min/{1.73_m2} — ABNORMAL LOW (ref >59)

## 2021-10-03 LAB — IRON,TIBC AND FERRITIN PANEL
Ferritin: 33 ng/mL (ref 15–150)
Iron Saturation: 12 % — ABNORMAL LOW (ref 15–55)
Iron: 46 ug/dL (ref 27–139)
Total Iron Binding Capacity: 381 ug/dL (ref 250–450)
UIBC: 335 ug/dL (ref 118–369)

## 2021-10-03 LAB — LIPID PANEL WITH LDL/HDL RATIO
Cholesterol, Total: 157 mg/dL (ref 100–199)
HDL: 88 mg/dL (ref >39)
LDL Chol Calc (NIH): 54 mg/dL (ref 0–99)
LDL/HDL Ratio: 0.6 ratio (ref 0.0–3.2)
Triglycerides: 77 mg/dL (ref 0–149)
VLDL Cholesterol Cal: 15 mg/dL (ref 5–40)

## 2021-10-03 LAB — HEMOGLOBIN A1C
Est. average glucose Bld gHb Est-mCnc: 137 mg/dL
Hgb A1c MFr Bld: 6.4 % — ABNORMAL HIGH (ref 4.8–5.6)

## 2021-10-03 NOTE — Telephone Encounter (Signed)
Patient returning call about lab results. Please return call [phone] or [phone]

## 2021-10-03 NOTE — Progress Notes (Signed)
Labs are stable. We will discuss them at her appt in 2 days.

## 2021-10-05 ENCOUNTER — Encounter: Payer: Self-pay | Admitting: Nurse Practitioner

## 2021-10-05 ENCOUNTER — Other Ambulatory Visit: Payer: Self-pay

## 2021-10-05 ENCOUNTER — Ambulatory Visit (INDEPENDENT_AMBULATORY_CARE_PROVIDER_SITE_OTHER): Payer: Medicare Other | Admitting: Nurse Practitioner

## 2021-10-05 VITALS — BP 139/82 | HR 112 | Ht 61.0 in | Wt 220.0 lb

## 2021-10-05 DIAGNOSIS — I5032 Chronic diastolic (congestive) heart failure: Secondary | ICD-10-CM

## 2021-10-05 DIAGNOSIS — E785 Hyperlipidemia, unspecified: Secondary | ICD-10-CM

## 2021-10-05 DIAGNOSIS — N1831 Chronic kidney disease, stage 3a: Secondary | ICD-10-CM | POA: Diagnosis not present

## 2021-10-05 DIAGNOSIS — R7303 Prediabetes: Secondary | ICD-10-CM

## 2021-10-05 NOTE — Assessment & Plan Note (Signed)
-  discussed increasing hydration -she is on 2 diuretics for CHF and HTN, decreased renal fcn by be diuretic related, but she ahs been doing well with these for a long time -referral to nephrology

## 2021-10-05 NOTE — Assessment & Plan Note (Addendum)
-  followed by Dr Ronnell Freshwater Readings from Last 3 Encounters:  10/05/21 220 lb 0.6 oz (99.8 kg)  06/05/21 212 lb (96.2 kg)  04/04/21 211 lb (95.7 kg)   -she states she has been gaining weight recently d/t not being able to exercise -taking 2 diuretics and renal function is worse over her last 2 visits, but BP is borderline

## 2021-10-05 NOTE — Progress Notes (Signed)
Acute Office Visit  Subjective:    Patient ID: Sarah Cooper, female    DOB: [birth year], 70 y.o.   MRN: [id number]  Chief Complaint  Patient presents with   Follow-up    Lab follow up     HPI Patient is in today for lab follow-up for HTN, HLD, and prediabetes.  Past Medical History:  Diagnosis Date   Anxiety    Arthritis    back, hips, knees   Cancer ([place]) 1978   cervical   CHF (congestive heart failure) ([place])    Phreesia 11/28/2020   Depression    Hyperlipidemia 12/01/2020   Hypertension    Pneumothorax 1977    Past Surgical History:  Procedure Laterality Date   BREAST BIOPSY Left    approx @ 48 benign    HIP SURGERY  2007   right   JOINT REPLACEMENT  2007   TOTAL HIP ARTHROPLASTY Left 07/25/2020   Procedure: LEFT TOTAL HIP ARTHROPLASTY ANTERIOR APPROACH;  Surgeon: Melrose Nakayama, MD;  Location: [facility];  Service: Orthopedics;  Laterality: Left;    Family History  Problem Relation Age of Onset   Heart disease Mother 51   Arthritis Mother    Diabetes Mother    Alcohol abuse Father    Cancer Father 40       lung   Drug abuse Daughter    Asthma Son    Hypertension Son    Mental illness Maternal Aunt    Diabetes Maternal Grandmother    Diabetes Paternal Grandmother    Mental illness Daughter        schizophrenia   Depression Daughter    Mental illness Daughter        anxiety   Obesity Daughter        bariatric surgery   Heart disease Maternal Aunt     Social History   Socioeconomic History   Marital status: Single    Spouse name: Not on file   Number of children: 4   Years of education: Not on file   Highest education level: Not on file  Occupational History   Occupation: retired- from [employer]  Tobacco Use   Smoking status: Former    Packs/day: 1.00    Years: 50.00    Pack years: 50.00    Types: Cigarettes    Start date: 1966    Quit date: 10/14/2010    Years since quitting: 10.9   Smokeless tobacco: Never  Vaping Use    Vaping Use: Never used  Substance and Sexual Activity   Alcohol use: Yes    Alcohol/week: 3.0 standard drinks    Types: 3 Glasses of wine per week    Comment: 3 glasses per week   Drug use: No   Sexual activity: Not Currently  Other Topics Concern   Not on file  Social History Narrative   Not on file   Social Determinants of Health   Financial Resource Strain: Low Risk    Difficulty of Paying Living Expenses: Not hard at all  Food Insecurity: No Food Insecurity   Worried About Charity fundraiser in the Last Year: Never true   Ran Out of Food in the Last Year: Never true  Transportation Needs: No Transportation Needs   Lack of Transportation (Medical): No   Lack of Transportation (Non-Medical): No  Physical Activity: Sufficiently Active   Days of Exercise per Week: 5 days   Minutes of Exercise per Session: 30 min  Stress: No Stress  Concern Present   Feeling of Stress : Not at all  Social Connections: Unknown   Frequency of Communication with Friends and Family: Not on file   Frequency of Social Gatherings with Friends and Family: Twice a week   Attends Religious Services: More than 4 times per year   Active Member of [employer] or Organizations: No   Attends Archivist Meetings: Never   Marital Status: Divorced  Human resources officer Violence: Not At Risk   Fear of Current or Ex-Partner: No   Emotionally Abused: No   Physically Abused: No   Sexually Abused: No    Outpatient Medications Prior to Visit  Medication Sig Dispense Refill   amLODipine (NORVASC) 10 MG tablet TAKE 1 TABLET BY MOUTH EVERY DAY 90 tablet 3   atorvastatin (LIPITOR) 10 MG tablet TAKE 1 TABLET BY MOUTH EVERY DAY 90 tablet 3   lisinopril-hydrochlorothiazide (ZESTORETIC) 20-25 MG tablet Take 1 tablet by mouth daily. 90 tablet 3   QUEtiapine (SEROQUEL) 50 MG tablet TAKE 1 TABLET BY MOUTH EVERYDAY AT BEDTIME 90 tablet 1   spironolactone (ALDACTONE) 25 MG tablet TAKE 1 TABLET (25 MG TOTAL) BY MOUTH  DAILY. 90 tablet 0   traMADol (ULTRAM) 50 MG tablet Take 1 tablet (50 mg total) by mouth every 6 (six) hours as needed for moderate pain or severe pain. 120 tablet 0   No facility-administered medications prior to visit.    No Known Allergies  Review of Systems  Constitutional: Negative.   Respiratory: Negative.    Cardiovascular: Negative.   Musculoskeletal:  Positive for arthralgias.       Chronic knee and hip pain; followed by ortho  Psychiatric/Behavioral: Negative.        Objective:    Physical Exam Constitutional:      Appearance: Normal appearance. She is obese.  Cardiovascular:     Rate and Rhythm: Regular rhythm. Tachycardia present.     Pulses: Normal pulses.     Heart sounds: Normal heart sounds.     Comments: Manual HR was 100 by auscultation Pulmonary:     Effort: Pulmonary effort is normal.     Breath sounds: Normal breath sounds.  Neurological:     Mental Status: She is alert.  Psychiatric:        Mood and Affect: Mood normal.        Behavior: Behavior normal.        Thought Content: Thought content normal.        Judgment: Judgment normal.    BP 139/82    Pulse (!) 112    Ht '5\' 1"'  (1.549 m)    Wt 220 lb 0.6 oz (99.8 kg)    LMP 10/15/2007 (Approximate)    SpO2 93%    BMI 41.58 kg/m  Wt Readings from Last 3 Encounters:  10/05/21 220 lb 0.6 oz (99.8 kg)  06/05/21 212 lb (96.2 kg)  04/04/21 211 lb (95.7 kg)    Health Maintenance Due  Topic Date Due   Zoster Vaccines- Shingrix (1 of 2) Never done   Pneumonia Vaccine 29+ Years old (3) 07/28/2017   COVID-19 Vaccine (5 - Booster for Moderna series) 04/26/2021    There are no preventive care reminders to display for this patient.   No results found for: TSH Lab Results  Component Value Date   WBC 7.6 10/02/2021   HGB 11.1 10/02/2021   HCT 35.4 10/02/2021   MCV 82 10/02/2021   PLT 286 10/02/2021   Lab Results  Component  Value Date   NA 136 10/02/2021   K 4.9 10/02/2021   CO2 23 10/02/2021    GLUCOSE 95 10/02/2021   BUN 27 10/02/2021   CREATININE 1.25 (H) 10/02/2021   BILITOT <0.2 10/02/2021   ALKPHOS 94 10/02/2021   AST 17 10/02/2021   ALT 10 10/02/2021   PROT 7.9 10/02/2021   ALBUMIN 4.7 10/02/2021   CALCIUM 10.0 10/02/2021   ANIONGAP 11 07/19/2020   EGFR 46 (L) 10/02/2021   Lab Results  Component Value Date   CHOL 157 10/02/2021   Lab Results  Component Value Date   HDL 88 10/02/2021   Lab Results  Component Value Date   LDLCALC 54 10/02/2021   Lab Results  Component Value Date   TRIG 77 10/02/2021   Lab Results  Component Value Date   CHOLHDL 2.3 11/19/2016   Lab Results  Component Value Date   HGBA1C 6.4 (H) 10/02/2021       Assessment & Plan:   Problem List Items Addressed This Visit       Cardiovascular and Mediastinum   Chronic diastolic heart failure ([place])    -followed by Dr Ronnell Freshwater Readings from Last 3 Encounters:  10/05/21 220 lb 0.6 oz (99.8 kg)  06/05/21 212 lb (96.2 kg)  04/04/21 211 lb (95.7 kg)   -she states she has been gaining weight recently d/t not being able to exercise -taking 2 diuretics and renal function is worse over her last 2 visits, but BP is borderline        Genitourinary   Stage 3a chronic kidney disease ([place]) - Primary    -discussed increasing hydration -she is on 2 diuretics for CHF and HTN, decreased renal fcn by be diuretic related, but she ahs been doing well with these for a long time -referral to nephrology      Relevant Orders   Ambulatory referral to Nephrology     Other   Pre-diabetes    Lab Results  Component Value Date   HGBA1C 6.4 (H) 10/02/2021  -still in prediabetic range -if she becomes diabetic, would consider GLP-1 to help with weight loss (would benefit her ortho issues)      Hyperlipidemia    Lab Results  Component Value Date   CHOL 157 10/02/2021   HDL 88 10/02/2021   LDLCALC 54 10/02/2021   TRIG 77 10/02/2021   CHOLHDL 2.3 11/19/2016  -well controlled; continue  current meds        No orders of the defined types were placed in this encounter.    Noreene Larsson, NP

## 2021-10-05 NOTE — Assessment & Plan Note (Signed)
Lab Results  Component Value Date   CHOL 157 10/02/2021   HDL 88 10/02/2021   LDLCALC 54 10/02/2021   TRIG 77 10/02/2021   CHOLHDL 2.3 11/19/2016   -well controlled; continue current meds

## 2021-10-05 NOTE — Assessment & Plan Note (Signed)
Lab Results  Component Value Date   HGBA1C 6.4 (H) 10/02/2021   -still in prediabetic range -if she becomes diabetic, would consider GLP-1 to help with weight loss (would benefit her ortho issues)

## 2021-10-05 NOTE — Patient Instructions (Signed)
Please have fasting labs drawn 2-3 days prior to your appointment so we can discuss the results during your office visit.  I will be moving to [place] located at [address], [place], [place] [address] effective Oct 14, 2021. If you would like to establish care with [facility] please call [phone].

## 2021-10-12 DIAGNOSIS — M25562 Pain in left knee: Secondary | ICD-10-CM | POA: Diagnosis not present

## 2021-11-23 ENCOUNTER — Other Ambulatory Visit: Payer: Self-pay

## 2021-11-23 ENCOUNTER — Ambulatory Visit: Payer: Medicare Other

## 2021-11-23 DIAGNOSIS — I6521 Occlusion and stenosis of right carotid artery: Secondary | ICD-10-CM

## 2021-12-03 ENCOUNTER — Other Ambulatory Visit: Payer: Self-pay

## 2021-12-03 ENCOUNTER — Encounter: Payer: Self-pay | Admitting: Cardiology

## 2021-12-03 ENCOUNTER — Ambulatory Visit: Payer: Medicare Other | Admitting: Cardiology

## 2021-12-03 VITALS — BP 137/87 | HR 117 | Temp 98.4°F | Resp 17 | Ht 61.0 in | Wt 217.8 lb

## 2021-12-03 DIAGNOSIS — I351 Nonrheumatic aortic (valve) insufficiency: Secondary | ICD-10-CM

## 2021-12-03 DIAGNOSIS — I10 Essential (primary) hypertension: Secondary | ICD-10-CM

## 2021-12-03 DIAGNOSIS — I5032 Chronic diastolic (congestive) heart failure: Secondary | ICD-10-CM

## 2021-12-03 MED ORDER — SPIRONOLACTONE 25 MG PO TABS: 25.0000 mg | ORAL_TABLET | Freq: Every day | ORAL | 1 refills | Status: AC

## 2021-12-03 NOTE — Progress Notes (Signed)
Primary Physician/Referring:  Tisovec, Fransico Him, MD  Patient ID: Sarah Cooper, female    DOB: [birth year], 71 y.o.   MRN: [id number]  No chief complaint on file.  HPI:    Sarah Cooper  is a 71 y.o. African American female with hypertension, mild asymptomatic left carotid stenosis, mild hyperlipidemia, prediabetes mellitus, insomnia, bilateral mild leg edema secondary to varicose veins, degenerative arthritis, morbid obesity, chronic dyspnea on exertion presents here for annual visit of hypertension and chronic diastolic heart failure.  States that she has not had any acute decompensated heart failure symptoms including PND or orthopnea or leg edema.  She underwent left hip replacement in October 2021 without any periprocedural cardiac complication, now planning left knee replacement. Dyspnea has remained stable.    Past Medical History:  Diagnosis Date   Anxiety    Arthritis    back, hips, knees   Cancer ([place]) 1978   cervical   CHF (congestive heart failure) ([place])    Phreesia 11/28/2020   Depression    Hyperlipidemia 12/01/2020   Hypertension    Pneumothorax 1977   Past Surgical History:  Procedure Laterality Date   BREAST BIOPSY Left    approx @ 48 benign    HIP SURGERY  2007   right   JOINT REPLACEMENT  2007   TOTAL HIP ARTHROPLASTY Left 07/25/2020   Procedure: LEFT TOTAL HIP ARTHROPLASTY ANTERIOR APPROACH;  Surgeon: Melrose Nakayama, MD;  Location: [facility];  Service: Orthopedics;  Laterality: Left;   Social History   Tobacco Use   Smoking status: Former    Packs/day: 1.00    Years: 50.00    Pack years: 50.00    Types: Cigarettes    Start date: 1966    Quit date: 10/14/2010    Years since quitting: 11.1   Smokeless tobacco: Never  Substance Use Topics   Alcohol use: Yes    Alcohol/week: 3.0 standard drinks    Types: 3 Glasses of wine per week    Comment: 3 glasses per week   Marital status: Single   ROS  Review of Systems   Cardiovascular:  Positive for dyspnea on exertion. Negative for chest pain, leg swelling and paroxysmal nocturnal dyspnea.  Objective   Vitals with BMI 10/05/2021 06/05/2021 04/04/2021  Height 5\' 1"  5' 1.5" -  Weight 220 lbs 1 oz 212 lbs 211 lbs  BMI 76.2 83.15 -  Systolic 176 160 -  Diastolic 82 71 -  Pulse 737 96 -    Physical Exam Constitutional:      Comments: Morbidly obese in no acute distress  Neck:     Vascular: No JVD.  Cardiovascular:     Rate and Rhythm: Normal rate and regular rhythm.     Pulses:          Radial pulses are 2+ on the right side and 2+ on the left side.       Popliteal pulses are 2+ on the right side and 2+ on the left side.       Dorsalis pedis pulses are 2+ on the right side and 2+ on the left side.       Posterior tibial pulses are 0 on the right side and 0 on the left side.     Heart sounds: Murmur heard.  Medium-pitched midsystolic murmur is present with a grade of 2/6 at the upper right sternal border.  Pulmonary:     Effort: Pulmonary effort is normal.     Breath sounds:  Normal breath sounds. No wheezing.  Abdominal:     Palpations: Abdomen is soft.  Musculoskeletal:     Right lower leg: No edema.     Left lower leg: No edema.   Laboratory examination:   Recent Labs    02/07/21 0813 06/05/21 0855 10/02/21 0949  NA 141 139 136  K 4.5 4.7 4.9  CL 103 102 99  CO2 24 21 23   GLUCOSE 91 104* 95  BUN 16 26 27   CREATININE 0.97 1.12* 1.25*  CALCIUM 9.9 9.7 10.0    CMP Latest Ref Rng & Units 10/02/2021 06/05/2021 02/07/2021  Glucose 70 - 99 mg/dL 95 104(H) 91  BUN 8 - 27 mg/dL 27 26 16   Creatinine 0.57 - 1.00 mg/dL 1.25(H) 1.12(H) 0.97  Sodium 134 - 144 mmol/L 136 139 141  Potassium 3.5 - 5.2 mmol/L 4.9 4.7 4.5  Chloride 96 - 106 mmol/L 99 102 103  CO2 20 - 29 mmol/L 23 21 24   Calcium 8.7 - 10.3 mg/dL 10.0 9.7 9.9  Total Protein 6.0 - 8.5 g/dL 7.9 7.3 7.3  Total Bilirubin 0.0 - 1.2 mg/dL <0.2 0.3 0.2  Alkaline Phos 44 - 121 IU/L 94  88 81  AST 0 - 40 IU/L 17 16 15   ALT 0 - 32 IU/L 10 12 14    CBC Latest Ref Rng & Units 10/02/2021 06/05/2021 02/07/2021  WBC 3.4 - 10.8 x10E3/uL 7.6 6.5 5.5  Hemoglobin 11.1 - 15.9 g/dL 11.1 10.8(L) 10.6(L)  Hematocrit 34.0 - 46.6 % 35.4 33.7(L) 34.3  Platelets 150 - 450 x10E3/uL 286 246 245   Lipid Panel Recent Labs    02/07/21 0813 06/05/21 0855 10/02/21 0949  CHOL 149 154 157  TRIG 62 102 77  LDLCALC 62 62 54  HDL 74 74 88      BNP (last 3 results) No results for input(s): BNP in the last 8760 hours.  ProBNP (last 3 results) No results for input(s): PROBNP in the last 8760 hours.     Medications and allergies   No Known Allergies    Current Outpatient Medications:    amLODipine (NORVASC) 10 MG tablet, TAKE 1 TABLET BY MOUTH EVERY DAY, Disp: 90 tablet, Rfl: 3   atorvastatin (LIPITOR) 10 MG tablet, TAKE 1 TABLET BY MOUTH EVERY DAY, Disp: 90 tablet, Rfl: 3   Cholecalciferol (VITAMIN D3) 25 MCG (1000 UT) CAPS, Take 2 tablets by mouth daily., Disp: , Rfl:    lisinopril-hydrochlorothiazide (ZESTORETIC) 20-25 MG tablet, Take 1 tablet by mouth daily., Disp: 90 tablet, Rfl: 3   Multiple Vitamin (MULTI VITAMIN) TABS, Take 1 tablet by mouth daily., Disp: , Rfl:    spironolactone (ALDACTONE) 25 MG tablet, TAKE 1 TABLET (25 MG TOTAL) BY MOUTH DAILY., Disp: 90 tablet, Rfl: 0   traMADol (ULTRAM) 50 MG tablet, Take 1 tablet (50 mg total) by mouth every 6 (six) hours as needed for moderate pain or severe pain., Disp: 120 tablet, Rfl: 0  Radiology:   Cardiac Studies:   Lexiscan (Walking with mod Bruce)Tetrofosmin Stress Test  07/10/2020: Nondiagnostic ECG stress. Myocardial perfusion is normal. Overall LV systolic function is normal without regional wall motion abnormalities. Stress LV EF: 57%.  Compared to the study done on 09/06/2019, no change in perfusion, previously LVEF was calculated at 30%.  Low risk.  Echocardiogram 04/06/2020:  Study Quality: Technically  Difficult  Normal LV systolic function with visual EF 55-60%. Left ventricle cavity  is normal in size. Mild left ventricular hypertrophy. Normal global wall  motion. Normal diastolic filling pattern, normal LAP. Calculated EF 57%.  Moderate (Grade II) aortic regurgitation.  Mild tricuspid regurgitation. No evidence of pulmonary hypertension.  No significant change compared to previous study dated 08/19/2019.  Carotid artery duplex 11/23/2021: Duplex suggests stenosis in the right internal carotid artery (minimal). Duplex suggests stenosis in the left internal carotid artery (minimal). Mild heterogeneous plaque bilateral carotid arteries.  Antegrade right vertebral artery flow. Antegrade left vertebral artery flow. No significant change since 11/21/2020.    EKG:  EKG 12/03/2021: Sinus tachycardia at rate of 104 bpm, normal axis, poor R wave progression, probably normal variant.  Single PVC.  No significant change from 11/27/2020, previous heart rate was 92 bpm.   Assessment     ICD-10-CM   1. Chronic diastolic (congestive) heart failure (HCC)  I50.32     2. Essential hypertension  I10     3. Moderate aortic regurgitation  I35.1        Recommendations:   Sarah Cooper  is a 71 y.o. African American female with hypertension, mild asymptomatic left carotid stenosis, mild hyperlipidemia, prediabetes mellitus, insomnia, bilateral mild leg edema secondary to varicose veins, degenerative arthritis, morbid obesity, chronic dyspnea on exertion presents here for annual visit of hypertension and chronic diastolic heart failure.  She has been doing well and except for chronic dyspnea no PND or orthopnea, no clinical evidence of heart failure, no JVD or leg edema.  The only issue that I have is underlying sinus tachycardia probably related to deconditioning and she has gained some pounds since last time as well.  She is contemplating left knee replacement surgery soon.  She has had a  negative nuclear stress test, from cardiac standpoint she is low risk.  Blood pressure is well controlled, lipids are at goal.  Reviewed the results of the carotid artery duplex, she has very minimal carotid plaque bilaterally, no further evaluation is indicated.  As she has remained stable and there is no recent acute heart failure exacerbation and no recent hospitalization, I will see her back on a as needed basis.  I will request Dr. Domenick Gong to follow her and also do prescription refills going forward.     Adrian Prows, MD, [facility] 12/03/2021, 7:11 AM Office: [phone]

## 2021-12-04 ENCOUNTER — Other Ambulatory Visit: Payer: Self-pay | Admitting: Orthopaedic Surgery

## 2021-12-05 NOTE — Progress Notes (Signed)
Your procedure is scheduled on:    12/18/21   Report to [facility] Main  Entrance   Report to admitting at  [place] AM     Call this number if you have problems the morning of surgery [phone]    REMEMBER: NO  SOLID FOOD CANDY OR GUM AFTER MIDNIGHT. THE NITE BEFORE SRUGERY CLEAR LIQUIDS UNTIL     0700AM     . NOTHING BY MOUTH EXCEPT CLEAR LIQUIDS UNTIL    0700AM  DAY OF SURGERY  . PLEASE FINISH ENSURE DRINK PER SURGEON ORDER  WHICH NEEDS TO BE COMPLETED AT   0700AM    . DAY OF SURGERY      CLEAR LIQUID DIET   Foods Allowed                                                                    Coffee and tea, regular and decaf         - NO MILK OR CREAM OR CREAMER                    Fruit ices (not with fruit pulp)                                      Iced Popsicles                                    Carbonated beverages, regular and diet                                    WHTIE CRANBERRY, WHITE GRAPE AND APPLE JUICES  Sports drinks like Gatorade Lightly seasoned clear broth ( CHICKEN, VEGETABLE OR BEEF)  Sugar ___________________________________________________________________      BRUSH YOUR TEETH MORNING OF SURGERY AND RINSE YOUR MOUTH OUT, NO CHEWING GUM CANDY OR MINTS.     Take these medicines the morning of surgery with A SIP OF WATER:   AMLODDIPINE  DO NOT TAKE ANY DIABETIC MEDICATIONS DAY OF YOUR SURGERY                               You may not have any metal on your body including hair pins and              piercings  Do not wear jewelry, make-up, lotions, powders or perfumes, deodorant             Do not wear nail polish on your fingernails.  Do not shave  48 hours prior to surgery.              Men may shave face and neck.   Do not bring valuables to the hospital. [place].  Contacts, dentures or bridgework may not be worn into surgery.  Leave suitcase in the car. After surgery  it may be  brought to your room.     Patients discharged the day of surgery will not be allowed to drive home. IF YOU ARE HAVING SURGERY AND GOING HOME THE SAME DAY, YOU MUST HAVE AN ADULT TO DRIVE YOU HOME AND BE WITH YOU FOR 24 HOURS. YOU MAY GO HOME BY TAXI OR UBER OR ORTHERWISE, BUT AN ADULT MUST ACCOMPANY YOU HOME AND STAY WITH YOU FOR 24 HOURS.  Name and phone number of your driver:  Special Instructions: N/A              Please read over the following fact sheets you were given: _____________________________________________________________________  [facility] - Preparing for Surgery Before surgery, you can play an important role.  Because skin is not sterile, your skin needs to be as free of germs as possible.  You can reduce the number of germs on your skin by washing with CHG (chlorahexidine gluconate) soap before surgery.  CHG is an antiseptic cleaner which kills germs and bonds with the skin to continue killing germs even after washing. Please DO NOT use if you have an allergy to CHG or antibacterial soaps.  If your skin becomes reddened/irritated stop using the CHG and inform your nurse when you arrive at Short Stay. Do not shave (including legs and underarms) for at least 48 hours prior to the first CHG shower.  You may shave your face/neck. Please follow these instructions carefully:  1.  Shower with CHG Soap the night before surgery and the  morning of Surgery.  2.  If you choose to wash your hair, wash your hair first as usual with your  normal  shampoo.  3.  After you shampoo, rinse your hair and body thoroughly to remove the  shampoo.                           4.  Use CHG as you would any other liquid soap.  You can apply chg directly  to the skin and wash                       Gently with a scrungie or clean washcloth.  5.  Apply the CHG Soap to your body ONLY FROM THE NECK DOWN.   Do not use on face/ open                           Wound or open sores. Avoid contact with eyes, ears mouth  and genitals (private parts).                       Wash face,  Genitals (private parts) with your normal soap.             6.  Wash thoroughly, paying special attention to the area where your surgery  will be performed.  7.  Thoroughly rinse your body with warm water from the neck down.  8.  DO NOT shower/wash with your normal soap after using and rinsing off  the CHG Soap.                9.  Pat yourself dry with a clean towel.            10.  Wear clean pajamas.            11.  Place clean sheets on your bed the  night of your first shower and do not  sleep with pets. Day of Surgery : Do not apply any lotions/deodorants the morning of surgery.  Please wear clean clothes to the hospital/surgery center.  FAILURE TO FOLLOW THESE INSTRUCTIONS MAY RESULT IN THE CANCELLATION OF YOUR SURGERY PATIENT SIGNATURE_________________________________  NURSE SIGNATURE__________________________________  ________________________________________________________________________

## 2021-12-05 NOTE — Progress Notes (Addendum)
Anesthesia Review:  PCP: Dr Osborne Casco  Cardiologist : DR Einar Gip- LOV 12/03/21  in epic  Chest x-ray : 12/10/26  EKG : 12/03/21  Echo :04/09/20  Carotids- 11/25/21  Stress test:07/10/20  Cardiac Cath :  Activity level: can do a flight of stair s without difficulty  Sleep Study/ CPAP : none  Fasting Blood Sugar :      / Checks Blood Sugar -- times a day:   Blood Thinner/ Instructions /Last Dose: ASA / Instructions/ Last Dose :   No covid test- ambulatory surgery  Hgba1c-12/10/21-5.9

## 2021-12-09 ENCOUNTER — Encounter: Payer: Self-pay | Admitting: Cardiology

## 2021-12-10 ENCOUNTER — Encounter (HOSPITAL_COMMUNITY): Payer: Self-pay

## 2021-12-10 ENCOUNTER — Ambulatory Visit (HOSPITAL_COMMUNITY)
Admission: RE | Admit: 2021-12-10 | Discharge: 2021-12-10 | Disposition: A | Discharge status: Home / Self Care | Payer: Medicare Other | Source: Ambulatory Visit | Attending: Orthopaedic Surgery | Admitting: Orthopaedic Surgery

## 2021-12-10 ENCOUNTER — Other Ambulatory Visit: Payer: Self-pay

## 2021-12-10 ENCOUNTER — Encounter (HOSPITAL_COMMUNITY)
Admission: RE | Admit: 2021-12-10 | Discharge: 2021-12-10 | Disposition: A | Discharge status: Home / Self Care | Payer: Medicare Other | Source: Ambulatory Visit | Attending: Orthopaedic Surgery | Admitting: Orthopaedic Surgery

## 2021-12-10 VITALS — BP 140/65 | HR 95 | Temp 98.1°F | Resp 16 | Ht 66.0 in | Wt 214.0 lb

## 2021-12-10 DIAGNOSIS — Z01818 Encounter for other preprocedural examination: Secondary | ICD-10-CM | POA: Diagnosis present

## 2021-12-10 DIAGNOSIS — R7303 Prediabetes: Secondary | ICD-10-CM | POA: Diagnosis present

## 2021-12-10 HISTORY — DX: Other specified postprocedural states: R11.2

## 2021-12-10 HISTORY — DX: Other specified postprocedural states: Z98.890

## 2021-12-10 LAB — BASIC METABOLIC PANEL
Anion gap: 8 (ref 5–15)
BUN: 19 mg/dL (ref 8–23)
CO2: 26 mmol/L (ref 22–32)
Calcium: 9.8 mg/dL (ref 8.9–10.3)
Chloride: 101 mmol/L (ref 98–111)
Creatinine, Ser: 0.97 mg/dL (ref 0.44–1.00)
GFR, Estimated: >60 mL/min (ref >60)
Glucose, Bld: 110 mg/dL — ABNORMAL HIGH (ref 70–99)
Potassium: 4.2 mmol/L (ref 3.5–5.1)
Sodium: 135 mmol/L (ref 135–145)

## 2021-12-10 LAB — CBC
HCT: 37.9 % (ref 36.0–46.0)
Hemoglobin: 11.8 g/dL — ABNORMAL LOW (ref 12.0–15.0)
MCH: 26.2 pg (ref 26.0–34.0)
MCHC: 31.1 g/dL (ref 30.0–36.0)
MCV: 84.2 fL (ref 80.0–100.0)
Platelets: 297 10*3/uL (ref 150–400)
RBC: 4.5 MIL/uL (ref 3.87–5.11)
RDW: 14.7 % (ref 11.5–15.5)
WBC: 6.4 10*3/uL (ref 4.0–10.5)
nRBC: 0 % (ref 0.0–0.2)

## 2021-12-10 LAB — HEMOGLOBIN A1C
Hgb A1c MFr Bld: 5.9 % — ABNORMAL HIGH (ref 4.8–5.6)
Mean Plasma Glucose: 122.63 mg/dL

## 2021-12-10 LAB — SURGICAL PCR SCREEN
MRSA, PCR: NEGATIVE
Staphylococcus aureus: NEGATIVE

## 2021-12-11 NOTE — Care Plan (Signed)
Ortho Bundle Case Management Note  Patient Details  Name: ALAN RILES MRN: [id number] Date of Birth: [birth year]    Spoke with patient prior to surgery. She will discharge to  home with family to assist. Has a RW at home. OPPT set up with [facility]. Patient and MD in agreement with plan. Choice offered                  DME Arranged:    DME Agency:     HH Arranged:    HH Agency:     Additional Comments: Please contact me with any questions of if this plan should need to change.  Ladell Heads,  [place] [facility]  [phone] 12/11/2021, 12:25 PM

## 2021-12-11 NOTE — Progress Notes (Signed)
Anesthesia Chart Review   Case: [id number] Date/Time: 12/18/21 0950   Procedure: LEFT TOTAL KNEE ARTHROPLASTY (Left: Knee)   Anesthesia type: Spinal   Pre-op diagnosis: LEFT KNEE DEGENERATIVE JOINT DISEASE   Location: [facility] ROOM 06 / [facility]   Surgeons: Melrose Nakayama, MD       DISCUSSION:70 y.o. former smoker with h/o PONV, HTN, CHF, left knee djd scheduled for above procedure 12/18/2021 with Dr. Melrose Nakayama.   Per cardiology note 12/03/2021, "She is contemplating left knee replacement surgery soon.  She has had a negative nuclear stress test, from cardiac standpoint she is low risk."  Anticipate pt can proceed with planned procedure barring acute status change.   VS: BP 140/65    Pulse 95    Temp 36.7 C (Oral)    Resp 16    Ht 5\' 6"  (1.676 m)    Wt 97.1 kg    LMP 10/15/2007 (Approximate)    SpO2 97%    BMI 34.54 kg/m   PROVIDERS: Tisovec, Fransico Him, MD is PCP   Adrian Prows, MD is Cardiologist  LABS: Labs reviewed: Acceptable for surgery. (all labs ordered are listed, but only abnormal results are displayed)  Labs Reviewed  CBC - Abnormal; Notable for the following components:      Result Value   Hemoglobin 11.8 (*)    All other components within normal limits  BASIC METABOLIC PANEL - Abnormal; Notable for the following components:   Glucose, Bld 110 (*)    All other components within normal limits  HEMOGLOBIN A1C - Abnormal; Notable for the following components:   Hgb A1c MFr Bld 5.9 (*)    All other components within normal limits  SURGICAL PCR SCREEN  TYPE AND SCREEN     IMAGES:   EKG: EKG 12/03/2021: Sinus tachycardia at rate of 104 bpm, normal axis, poor R wave progression, probably normal variant.  Single PVC.   CV: Echocardiogram 04/06/2020:  Study Quality: Technically Difficult  Normal LV systolic function with visual EF 55-60%. Left ventricle cavity  is normal in size. Mild left ventricular hypertrophy. Normal global wall  motion. Normal diastolic filling  pattern, normal LAP. Calculated EF 57%.  Moderate (Grade II) aortic regurgitation.  Mild tricuspid regurgitation. No evidence of pulmonary hypertension.  No significant change compared to previous study dated 08/19/2019.  Lexiscan (Walking with mod Bruce)Tetrofosmin Stress Test  07/10/2020: Nondiagnostic ECG stress. Myocardial perfusion is normal. Overall LV systolic function is normal without regional wall motion abnormalities. Stress LV EF: 57%.  Compared to the study done on 09/06/2019, no change in perfusion, previously LVEF was calculated at 30%.  Low risk. Past Medical History:  Diagnosis Date   Arthritis    back, hips, knees   Cancer ([place]) 1978   cervical   CHF (congestive heart failure) ([place])    Phreesia 11/28/2020   Hyperlipidemia 12/01/2020   Hypertension    Pneumothorax 1977   PONV (postoperative nausea and vomiting)     Past Surgical History:  Procedure Laterality Date   BREAST BIOPSY Left    approx @ 48 benign    collapsed lung on right      1970s   HERNIA REPAIR     age 36   HIP SURGERY  2007   right   JOINT REPLACEMENT  2007   TOTAL HIP ARTHROPLASTY Left 07/25/2020   Procedure: LEFT TOTAL HIP ARTHROPLASTY ANTERIOR APPROACH;  Surgeon: Melrose Nakayama, MD;  Location: [facility];  Service: Orthopedics;  Laterality: Left;  MEDICATIONS:  amLODipine (NORVASC) 10 MG tablet   atorvastatin (LIPITOR) 10 MG tablet   Cholecalciferol (VITAMIN D3) 25 MCG (1000 UT) CAPS   Doxylamine Succinate, Sleep, (EQ SLEEP AID PO)   lisinopril-hydrochlorothiazide (ZESTORETIC) 20-25 MG tablet   Multiple Vitamin (MULTI VITAMIN) TABS   spironolactone (ALDACTONE) 25 MG tablet   traMADol (ULTRAM) 50 MG tablet   No current facility-administered medications for this encounter.     Konrad Felix Ward, PA-C [facility] Pre-Surgical Testing [phone]

## 2021-12-17 NOTE — H&P (Signed)
TOTAL KNEE ADMISSION H&P ? ?Patient is being admitted for left total knee arthroplasty. ? ?Subjective: ? ?Chief Complaint:left knee pain. ? ?HPI: Sarah Cooper, 71 y.o. female, has a history of pain and functional disability in the left knee due to arthritis and has failed non-surgical conservative treatments for greater than 12 weeks to includeNSAID's and/or analgesics, corticosteriod injections, flexibility and strengthening excercises, use of assistive devices, weight reduction as appropriate, and activity modification.  Onset of symptoms was gradual, starting 5 years ago with gradually worsening course since that time. The patient noted no past surgery on the left knee(s).  Patient currently rates pain in the left knee(s) at 10 out of 10 with activity. Patient has night pain, worsening of pain with activity and weight bearing, pain that interferes with activities of daily living, crepitus, and joint swelling.  Patient has evidence of subchondral cysts, subchondral sclerosis, periarticular osteophytes, and joint space narrowing by imaging studies. There is no active infection. ? ?Patient Active Problem List  ? Diagnosis Date Noted  ? Stage 3a chronic kidney disease ([place]) 10/05/2021  ? Muscle spasm 02/06/2021  ? Insomnia, unspecified 12/01/2020  ? Hyperlipidemia 12/01/2020  ? Chronic diastolic heart failure ([place]) 12/01/2020  ? Leg edema 12/01/2020  ? Primary localized osteoarthritis of left hip 07/25/2020  ? Tubular adenoma of colon 12/11/2016  ? Dyspnea 12/11/2016  ? Vitamin D deficiency 11/21/2016  ? Pre-diabetes 11/21/2016  ? Essential hypertension 11/19/2016  ? Depression, recurrent ([place]) 11/19/2016  ? Osteoarthritis 11/19/2016  ? Chronic back pain greater than 3 months duration 11/19/2016  ? History of cervical dysplasia 11/19/2016  ? Morbid obesity ([place]) 11/19/2016  ? ?Past Medical History:  ?Diagnosis Date  ? Arthritis   ? back, hips, knees  ? Cancer [facility]) 1978  ? cervical  ? CHF (congestive heart  failure) ([place])   ? [employer] 11/28/2020  ? Hyperlipidemia 12/01/2020  ? Hypertension   ? Pneumothorax 1977  ? PONV (postoperative nausea and vomiting)   ?  ?Past Surgical History:  ?Procedure Laterality Date  ? BREAST BIOPSY Left   ? approx @ 48 benign   ? collapsed lung on right     ? 1970s  ? HERNIA REPAIR    ? age 70  ? HIP SURGERY  2007  ? right  ? JOINT REPLACEMENT  2007  ? TOTAL HIP ARTHROPLASTY Left 07/25/2020  ? Procedure: LEFT TOTAL HIP ARTHROPLASTY ANTERIOR APPROACH;  Surgeon: Melrose Nakayama, MD;  Location: [facility];  Service: Orthopedics;  Laterality: Left;  ?  ?No current facility-administered medications for this encounter.  ? ?Current Outpatient Medications  ?Medication Sig Dispense Refill Last Dose  ? amLODipine (NORVASC) 10 MG tablet TAKE 1 TABLET BY MOUTH EVERY DAY 90 tablet 3   ? Cholecalciferol (VITAMIN D3) 25 MCG (1000 UT) CAPS Take 2,000 Units by mouth daily.     ? Doxylamine Succinate, Sleep, (EQ SLEEP AID PO) Take 2 tablets by mouth at bedtime.     ? lisinopril-hydrochlorothiazide (ZESTORETIC) 20-25 MG tablet Take 1 tablet by mouth daily. 90 tablet 3   ? Multiple Vitamin (MULTI VITAMIN) TABS Take 1 tablet by mouth daily.     ? spironolactone (ALDACTONE) 25 MG tablet Take 1 tablet (25 mg total) by mouth daily. 90 tablet 1   ? traMADol (ULTRAM) 50 MG tablet Take 1 tablet (50 mg total) by mouth every 6 (six) hours as needed for moderate pain or severe pain. 120 tablet 0   ? atorvastatin (LIPITOR) 10 MG tablet TAKE  1 TABLET BY MOUTH EVERY DAY (Patient not taking: Reported on 12/04/2021) 90 tablet 3 Not Taking  ? ?No Known Allergies  ?Social History  ? ?Tobacco Use  ? Smoking status: Former  ?  Packs/day: 1.00  ?  Years: 50.00  ?  Pack years: 50.00  ?  Types: Cigarettes  ?  Start date: 1966  ?  Quit date: 10/14/2010  ?  Years since quitting: 11.1  ? Smokeless tobacco: Never  ?Substance Use Topics  ? Alcohol use: Not Currently  ?  Alcohol/week: 3.0 standard drinks  ?  Types: 3 Glasses of wine per week  ?   ?Family History  ?Problem Relation Age of Onset  ? Heart disease Mother 10  ? Arthritis Mother   ? Diabetes Mother   ? Alcohol abuse Father   ? Cancer Father 3  ?     lung  ? Mental illness Maternal Aunt   ? Heart disease Maternal Aunt   ? Diabetes Maternal Grandmother   ? Diabetes Paternal Grandmother   ? Drug abuse Daughter   ? Mental illness Daughter   ?     schizophrenia  ? Depression Daughter   ? Mental illness Daughter   ?     anxiety  ? Obesity Daughter   ?     bariatric surgery  ? Asthma Son   ? Hypertension Son   ?  ? ?Review of Systems  ?Musculoskeletal:  Positive for arthralgias.  ?     Left knee  ?All other systems reviewed and are negative. ? ?Objective: ? ?Physical Exam ?Constitutional:   ?   Appearance: Normal appearance.  ?HENT:  ?   Head: Normocephalic and atraumatic.  ?   Nose: Nose normal.  ?   Mouth/Throat:  ?   Pharynx: Oropharynx is clear.  ?Eyes:  ?   Extraocular Movements: Extraocular movements intact.  ?Cardiovascular:  ?   Rate and Rhythm: Normal rate.  ?Pulmonary:  ?   Effort: Pulmonary effort is normal.  ?Abdominal:  ?   Palpations: Abdomen is soft.  ?Musculoskeletal:  ?   Cervical back: Normal range of motion.  ?   Comments: Examination of the left knee shows range of motion from 0-110? of flexion.  She has tenderness palpation diffusely along her joint line.  1+ crepitation.  No significant effusion.  Her ligaments are stable.  Her calf is soft and nontender.  She is neurovascularly intact distally.   ?Skin: ?   General: Skin is warm and dry.  ?Neurological:  ?   General: No focal deficit present.  ?   Mental Status: She is alert and oriented to person, place, and time.  ?Psychiatric:     ?   Mood and Affect: Mood normal.     ?   Behavior: Behavior normal.     ?   Thought Content: Thought content normal.     ?   Judgment: Judgment normal.  ? ? ?Vital signs in last 24 hours: ?  ? ?Labs: ? ? ?Estimated body mass index is 34.54 kg/m? as calculated from the following: ?  Height as of  12/10/21: '5\' 6"'$  (1.676 m). ?  Weight as of 12/10/21: 97.1 kg. ? ? ?Imaging Review ?Plain radiographs demonstrate severe degenerative joint disease of the left knee(s). The overall alignment isneutral. The bone quality appears to be good for age and reported activity level. ? ? ? ? ? ?Assessment/Plan: ? ?End stage primary arthritis, left knee  ? ?The patient history,  physical examination, clinical judgment of the provider and imaging studies are consistent with end stage degenerative joint disease of the left knee(s) and total knee arthroplasty is deemed medically necessary. The treatment options including medical management, injection therapy arthroscopy and arthroplasty were discussed at length. The risks and benefits of total knee arthroplasty were presented and reviewed. The risks due to aseptic loosening, infection, stiffness, patella tracking problems, thromboembolic complications and other imponderables were discussed. The patient acknowledged the explanation, agreed to proceed with the plan and consent was signed. Patient is being admitted for inpatient treatment for surgery, pain control, PT, OT, prophylactic antibiotics, VTE prophylaxis, progressive ambulation and ADL's and discharge planning. The patient is planning to be discharged home with home health services ? ?Patient's anticipated LOS is less than 2 midnights, meeting these requirements: ?- Younger than 44 ?- Lives within 1 hour of care ?- Has a competent adult at home to recover with post-op recover ?- NO history of ? - Chronic pain requiring opiods ? - Diabetes ? - Coronary Artery Disease ? - Heart failure ? - Heart attack ? - Stroke ? - DVT/VTE ? - Cardiac arrhythmia ? - Respiratory Failure/COPD ? - Renal failure ? - Anemia ? - Advanced Liver disease ? ? ?

## 2021-12-18 ENCOUNTER — Encounter (HOSPITAL_COMMUNITY): Payer: Self-pay | Admitting: Orthopaedic Surgery

## 2021-12-18 ENCOUNTER — Ambulatory Visit (HOSPITAL_COMMUNITY)
Admission: RE | Admit: 2021-12-18 | Discharge: 2021-12-18 | Disposition: A | Discharge status: Home / Self Care | Payer: Medicare Other | Source: Ambulatory Visit | Attending: Orthopaedic Surgery | Admitting: Orthopaedic Surgery

## 2021-12-18 ENCOUNTER — Ambulatory Visit (HOSPITAL_COMMUNITY): Payer: Medicare Other | Admitting: Physician Assistant

## 2021-12-18 ENCOUNTER — Ambulatory Visit (HOSPITAL_BASED_OUTPATIENT_CLINIC_OR_DEPARTMENT_OTHER): Payer: Medicare Other | Admitting: Anesthesiology

## 2021-12-18 ENCOUNTER — Encounter (HOSPITAL_COMMUNITY)
Admission: RE | Disposition: A | Discharge status: Home / Self Care | Payer: Self-pay | Source: Ambulatory Visit | Attending: Orthopaedic Surgery

## 2021-12-18 DIAGNOSIS — N289 Disorder of kidney and ureter, unspecified: Secondary | ICD-10-CM

## 2021-12-18 DIAGNOSIS — M1712 Unilateral primary osteoarthritis, left knee: Secondary | ICD-10-CM | POA: Insufficient documentation

## 2021-12-18 DIAGNOSIS — I110 Hypertensive heart disease with heart failure: Secondary | ICD-10-CM

## 2021-12-18 DIAGNOSIS — Z79899 Other long term (current) drug therapy: Secondary | ICD-10-CM | POA: Insufficient documentation

## 2021-12-18 DIAGNOSIS — Z87891 Personal history of nicotine dependence: Secondary | ICD-10-CM | POA: Insufficient documentation

## 2021-12-18 DIAGNOSIS — I509 Heart failure, unspecified: Secondary | ICD-10-CM | POA: Diagnosis not present

## 2021-12-18 DIAGNOSIS — I130 Hypertensive heart and chronic kidney disease with heart failure and stage 1 through stage 4 chronic kidney disease, or unspecified chronic kidney disease: Secondary | ICD-10-CM | POA: Diagnosis not present

## 2021-12-18 DIAGNOSIS — I5032 Chronic diastolic (congestive) heart failure: Secondary | ICD-10-CM | POA: Insufficient documentation

## 2021-12-18 DIAGNOSIS — N1831 Chronic kidney disease, stage 3a: Secondary | ICD-10-CM | POA: Diagnosis not present

## 2021-12-18 HISTORY — PX: TOTAL KNEE ARTHROPLASTY: SHX125

## 2021-12-18 LAB — TYPE AND SCREEN
ABO/RH(D): O POS
Antibody Screen: NEGATIVE

## 2021-12-18 SURGERY — ARTHROPLASTY, KNEE, TOTAL
Anesthesia: Regional | Site: Knee | Laterality: Left

## 2021-12-18 MED ORDER — HYDROCODONE-ACETAMINOPHEN 5-325 MG PO TABS: 1.0000 | ORAL_TABLET | Freq: Four times a day (QID) | ORAL | 0 refills | Status: AC | PRN

## 2021-12-18 MED ORDER — SODIUM CHLORIDE (PF) 0.9 % IJ SOLN
INTRAMUSCULAR | Status: DC | PRN
  Administered 2021-12-18: 30 mL via INTRAVENOUS

## 2021-12-18 MED ORDER — METHOCARBAMOL 500 MG IVPB - SIMPLE MED
500.0000 mg | Freq: Four times a day (QID) | INTRAVENOUS | Status: DC | PRN
  Administered 2021-12-18: 500 mg via INTRAVENOUS

## 2021-12-18 MED ORDER — PHENYLEPHRINE HCL-NACL 20-0.9 MG/250ML-% IV SOLN
INTRAVENOUS | Status: DC | PRN
  Administered 2021-12-18: 30 ug/min via INTRAVENOUS

## 2021-12-18 MED ORDER — CHLORHEXIDINE GLUCONATE 0.12 % MT SOLN
15.0000 mL | Freq: Once | OROMUCOSAL | Status: AC
  Administered 2021-12-18: 15 mL via OROMUCOSAL

## 2021-12-18 MED ORDER — LACTATED RINGERS IV BOLUS
250.0000 mL | Freq: Once | INTRAVENOUS | Status: AC
  Administered 2021-12-18: 250 mL via INTRAVENOUS

## 2021-12-18 MED ORDER — KETOROLAC TROMETHAMINE 15 MG/ML IJ SOLN
INTRAMUSCULAR | Status: AC
  Filled 2021-12-18: qty 1

## 2021-12-18 MED ORDER — LACTATED RINGERS IV BOLUS
500.0000 mL | Freq: Once | INTRAVENOUS | Status: AC
  Administered 2021-12-18: 500 mL via INTRAVENOUS

## 2021-12-18 MED ORDER — KETOROLAC TROMETHAMINE 15 MG/ML IJ SOLN
7.5000 mg | Freq: Four times a day (QID) | INTRAMUSCULAR | Status: DC
Start: 2021-12-18 — End: 2021-12-18
  Administered 2021-12-18: 7.5 mg via INTRAVENOUS

## 2021-12-18 MED ORDER — TRANEXAMIC ACID-NACL 1000-0.7 MG/100ML-% IV SOLN: 1000.0000 mg | Freq: Once | INTRAVENOUS | Status: DC

## 2021-12-18 MED ORDER — LACTATED RINGERS IV SOLN: INTRAVENOUS | Status: DC

## 2021-12-18 MED ORDER — TRANEXAMIC ACID-NACL 1000-0.7 MG/100ML-% IV SOLN
1000.0000 mg | INTRAVENOUS | Status: AC
  Administered 2021-12-18: 1000 mg via INTRAVENOUS
  Filled 2021-12-18: qty 100

## 2021-12-18 MED ORDER — TRANEXAMIC ACID-NACL 1000-0.7 MG/100ML-% IV SOLN
INTRAVENOUS | Status: AC
  Filled 2021-12-18: qty 100

## 2021-12-18 MED ORDER — SODIUM CHLORIDE (PF) 0.9 % IJ SOLN
INTRAMUSCULAR | Status: AC
  Filled 2021-12-18: qty 50

## 2021-12-18 MED ORDER — 0.9 % SODIUM CHLORIDE (POUR BTL) OPTIME
TOPICAL | Status: DC | PRN
  Administered 2021-12-18: 1000 mL

## 2021-12-18 MED ORDER — DEXAMETHASONE SODIUM PHOSPHATE 10 MG/ML IJ SOLN
INTRAMUSCULAR | Status: DC | PRN
  Administered 2021-12-18: 5 mg

## 2021-12-18 MED ORDER — FENTANYL CITRATE PF 50 MCG/ML IJ SOSY: 25.0000 ug | PREFILLED_SYRINGE | INTRAMUSCULAR | Status: DC | PRN

## 2021-12-18 MED ORDER — METOCLOPRAMIDE HCL 5 MG PO TABS: 5.0000 mg | ORAL_TABLET | Freq: Three times a day (TID) | ORAL | Status: DC | PRN

## 2021-12-18 MED ORDER — METHOCARBAMOL 500 MG IVPB - SIMPLE MED
INTRAVENOUS | Status: AC
  Filled 2021-12-18: qty 50

## 2021-12-18 MED ORDER — ACETAMINOPHEN 500 MG PO TABS
500.0000 mg | ORAL_TABLET | Freq: Four times a day (QID) | ORAL | Status: DC
Start: 2021-12-18 — End: 2021-12-18

## 2021-12-18 MED ORDER — SODIUM CHLORIDE 0.9% IV SOLUTION
INTRAVENOUS | Status: DC | PRN
  Administered 2021-12-18: 3000 mL

## 2021-12-18 MED ORDER — ACETAMINOPHEN 325 MG PO TABS: 325.0000 mg | ORAL_TABLET | Freq: Four times a day (QID) | ORAL | Status: DC | PRN

## 2021-12-18 MED ORDER — PROPOFOL 500 MG/50ML IV EMUL
INTRAVENOUS | Status: AC
  Filled 2021-12-18: qty 50

## 2021-12-18 MED ORDER — BUPIVACAINE LIPOSOME 1.3 % IJ SUSP
INTRAMUSCULAR | Status: AC
  Filled 2021-12-18: qty 20

## 2021-12-18 MED ORDER — CEFAZOLIN SODIUM-DEXTROSE 2-4 GM/100ML-% IV SOLN
2.0000 g | INTRAVENOUS | Status: AC
  Administered 2021-12-18: 2 g via INTRAVENOUS
  Filled 2021-12-18: qty 100

## 2021-12-18 MED ORDER — ONDANSETRON HCL 4 MG/2ML IJ SOLN: 4.0000 mg | Freq: Four times a day (QID) | INTRAMUSCULAR | Status: DC | PRN

## 2021-12-18 MED ORDER — METHOCARBAMOL 500 MG PO TABS: 500.0000 mg | ORAL_TABLET | Freq: Four times a day (QID) | ORAL | Status: DC | PRN

## 2021-12-18 MED ORDER — ACETAMINOPHEN 500 MG PO TABS
1000.0000 mg | ORAL_TABLET | Freq: Once | ORAL | Status: AC
  Administered 2021-12-18: 1000 mg via ORAL
  Filled 2021-12-18: qty 2

## 2021-12-18 MED ORDER — DEXAMETHASONE SODIUM PHOSPHATE 10 MG/ML IJ SOLN
INTRAMUSCULAR | Status: AC
  Filled 2021-12-18: qty 1

## 2021-12-18 MED ORDER — BUPIVACAINE LIPOSOME 1.3 % IJ SUSP
INTRAMUSCULAR | Status: DC | PRN
Start: 2021-12-18 — End: 2021-12-18
  Administered 2021-12-18: 20 mL

## 2021-12-18 MED ORDER — FENTANYL CITRATE PF 50 MCG/ML IJ SOSY
50.0000 ug | PREFILLED_SYRINGE | INTRAMUSCULAR | Status: DC
  Administered 2021-12-18: 50 ug via INTRAVENOUS
  Filled 2021-12-18: qty 2

## 2021-12-18 MED ORDER — HYDROCODONE-ACETAMINOPHEN 5-325 MG PO TABS
ORAL_TABLET | ORAL | Status: AC
  Filled 2021-12-18: qty 1

## 2021-12-18 MED ORDER — DEXAMETHASONE SODIUM PHOSPHATE 10 MG/ML IJ SOLN
INTRAMUSCULAR | Status: DC | PRN
  Administered 2021-12-18: 8 mg via INTRAVENOUS

## 2021-12-18 MED ORDER — TRANEXAMIC ACID 1000 MG/10ML IV SOLN
INTRAVENOUS | Status: DC | PRN
  Administered 2021-12-18: 2000 mg via TOPICAL

## 2021-12-18 MED ORDER — BUPIVACAINE-EPINEPHRINE 0.5% -1:200000 IJ SOLN
INTRAMUSCULAR | Status: DC | PRN
  Administered 2021-12-18: 30 mL

## 2021-12-18 MED ORDER — PROPOFOL 1000 MG/100ML IV EMUL
INTRAVENOUS | Status: AC
  Filled 2021-12-18: qty 100

## 2021-12-18 MED ORDER — PHENYLEPHRINE HCL (PRESSORS) 10 MG/ML IV SOLN
INTRAVENOUS | Status: AC
  Filled 2021-12-18: qty 1

## 2021-12-18 MED ORDER — MORPHINE SULFATE (PF) 2 MG/ML IV SOLN: 0.5000 mg | INTRAVENOUS | Status: DC | PRN

## 2021-12-18 MED ORDER — HYDROCODONE-ACETAMINOPHEN 7.5-325 MG PO TABS: 1.0000 | ORAL_TABLET | ORAL | Status: DC | PRN

## 2021-12-18 MED ORDER — TIZANIDINE HCL 4 MG PO TABS: 4.0000 mg | ORAL_TABLET | Freq: Four times a day (QID) | ORAL | 1 refills | Status: AC | PRN

## 2021-12-18 MED ORDER — LACTATED RINGERS IV BOLUS: 250.0000 mL | Freq: Once | INTRAVENOUS | Status: DC

## 2021-12-18 MED ORDER — TRANEXAMIC ACID 1000 MG/10ML IV SOLN
2000.0000 mg | INTRAVENOUS | Status: DC
  Filled 2021-12-18: qty 20

## 2021-12-18 MED ORDER — ONDANSETRON HCL 4 MG/2ML IJ SOLN
INTRAMUSCULAR | Status: AC
  Filled 2021-12-18: qty 2

## 2021-12-18 MED ORDER — ONDANSETRON HCL 4 MG PO TABS: 4.0000 mg | ORAL_TABLET | Freq: Four times a day (QID) | ORAL | Status: DC | PRN

## 2021-12-18 MED ORDER — ONDANSETRON HCL 4 MG/2ML IJ SOLN
INTRAMUSCULAR | Status: DC | PRN
  Administered 2021-12-18: 4 mg via INTRAVENOUS

## 2021-12-18 MED ORDER — PROPOFOL 10 MG/ML IV BOLUS
INTRAVENOUS | Status: DC | PRN
  Administered 2021-12-18 (×5): 20 mg via INTRAVENOUS

## 2021-12-18 MED ORDER — METOCLOPRAMIDE HCL 5 MG/ML IJ SOLN: 5.0000 mg | Freq: Three times a day (TID) | INTRAMUSCULAR | Status: DC | PRN

## 2021-12-18 MED ORDER — ORAL CARE MOUTH RINSE: 15.0000 mL | Freq: Once | OROMUCOSAL | Status: AC

## 2021-12-18 MED ORDER — POVIDONE-IODINE 10 % EX SWAB
2.0000 "application " | Freq: Once | CUTANEOUS | Status: AC
  Administered 2021-12-18: 2 via TOPICAL

## 2021-12-18 MED ORDER — BUPIVACAINE-EPINEPHRINE (PF) 0.5% -1:200000 IJ SOLN
INTRAMUSCULAR | Status: AC
  Filled 2021-12-18: qty 30

## 2021-12-18 MED ORDER — CEFAZOLIN SODIUM-DEXTROSE 2-4 GM/100ML-% IV SOLN: 2.0000 g | Freq: Four times a day (QID) | INTRAVENOUS | Status: DC

## 2021-12-18 MED ORDER — HYDROCODONE-ACETAMINOPHEN 5-325 MG PO TABS
1.0000 | ORAL_TABLET | ORAL | Status: DC | PRN
  Administered 2021-12-18: 1 via ORAL

## 2021-12-18 MED ORDER — MIDAZOLAM HCL 2 MG/2ML IJ SOLN
1.0000 mg | INTRAMUSCULAR | Status: DC
  Administered 2021-12-18: 1 mg via INTRAVENOUS
  Filled 2021-12-18: qty 2

## 2021-12-18 MED ORDER — ROPIVACAINE HCL 5 MG/ML IJ SOLN
INTRAMUSCULAR | Status: DC | PRN
Start: 2021-12-18 — End: 2021-12-18
  Administered 2021-12-18: 20 mL via PERINEURAL

## 2021-12-18 MED ORDER — BUPIVACAINE LIPOSOME 1.3 % IJ SUSP: 20.0000 mL | Freq: Once | INTRAMUSCULAR | Status: DC

## 2021-12-18 MED ORDER — PROPOFOL 500 MG/50ML IV EMUL
INTRAVENOUS | Status: DC | PRN
  Administered 2021-12-18: 130 ug/kg/min via INTRAVENOUS

## 2021-12-18 MED ORDER — BUPIVACAINE HCL (PF) 0.75 % IJ SOLN
INTRAMUSCULAR | Status: DC | PRN
  Administered 2021-12-18: 1.6 mL

## 2021-12-18 MED ORDER — ASPIRIN EC 81 MG PO TBEC: 81.0000 mg | DELAYED_RELEASE_TABLET | Freq: Two times a day (BID) | ORAL | 0 refills | Status: AC

## 2021-12-18 SURGICAL SUPPLY — 51 items
ATTUNE PS FEM LT SZ 4 CEM KNEE (Femur) ×1 IMPLANT
ATTUNE PSRP INSR SZ4 8 KNEE (Insert) ×1 IMPLANT
BAG COUNTER SPONGE SURGICOUNT (BAG) ×2 IMPLANT
BAG DECANTER FOR FLEXI CONT (MISCELLANEOUS) ×2 IMPLANT
BAG ZIPLOCK 12X15 (MISCELLANEOUS) ×2 IMPLANT
BASE TIBIA ATTUNE KNEE SYS SZ6 (Knees) IMPLANT
BLADE SAGITTAL 25.0X1.19X90 (BLADE) ×2 IMPLANT
BLADE SAW SGTL 11.0X1.19X90.0M (BLADE) ×2 IMPLANT
BLADE SURG SZ10 CARB STEEL (BLADE) ×2 IMPLANT
BNDG ELASTIC 6X5.8 VLCR STR LF (GAUZE/BANDAGES/DRESSINGS) ×2 IMPLANT
BOOTIES KNEE HIGH SLOAN (MISCELLANEOUS) ×2 IMPLANT
BOWL SMART MIX CTS (DISPOSABLE) ×2 IMPLANT
CEMENT HV SMART SET (Cement) ×4 IMPLANT
COVER SURGICAL LIGHT HANDLE (MISCELLANEOUS) ×2 IMPLANT
CUFF TOURN SGL QUICK 34 (TOURNIQUET CUFF) ×1
CUFF TRNQT CYL 34X4.125X (TOURNIQUET CUFF) ×1 IMPLANT
DRAPE INCISE IOBAN 66X45 STRL (DRAPES) ×2 IMPLANT
DRAPE SHEET LG 3/4 BI-LAMINATE (DRAPES) ×2 IMPLANT
DRAPE U-SHAPE 47X51 STRL (DRAPES) ×2 IMPLANT
DRSG AQUACEL AG ADV 3.5X10 (GAUZE/BANDAGES/DRESSINGS) ×2 IMPLANT
DRSG TELFA 4X10 ISLAND STR (GAUZE/BANDAGES/DRESSINGS) ×1 IMPLANT
DURAPREP 26ML APPLICATOR (WOUND CARE) ×4 IMPLANT
ELECT REM PT RETURN 15FT ADLT (MISCELLANEOUS) ×2 IMPLANT
GLOVE SRG 8 PF TXTR STRL LF DI (GLOVE) ×2 IMPLANT
GLOVE SURG ENC MOIS LTX SZ8 (GLOVE) ×4 IMPLANT
GLOVE SURG UNDER POLY LF SZ8 (GLOVE) ×2
GOWN STRL REUS W/TWL XL LVL3 (GOWN DISPOSABLE) ×4 IMPLANT
HANDPIECE INTERPULSE COAX TIP (DISPOSABLE) ×1
HOLDER FOLEY CATH W/STRAP (MISCELLANEOUS) IMPLANT
HOOD PEEL AWAY FLYTE STAYCOOL (MISCELLANEOUS) ×6 IMPLANT
KIT TURNOVER KIT A (KITS) IMPLANT
MANIFOLD NEPTUNE II (INSTRUMENTS) ×2 IMPLANT
NEEDLE HYPO 22GX1.5 SAFETY (NEEDLE) ×2 IMPLANT
NS IRRIG 1000ML POUR BTL (IV SOLUTION) ×2 IMPLANT
PACK TOTAL KNEE CUSTOM (KITS) ×2 IMPLANT
PAD ARMBOARD 7.5X6 YLW CONV (MISCELLANEOUS) ×2 IMPLANT
PATELLA MEDIAL ATTUN 35MM KNEE (Knees) ×1 IMPLANT
PROTECTOR NERVE ULNAR (MISCELLANEOUS) ×2 IMPLANT
SET HNDPC FAN SPRY TIP SCT (DISPOSABLE) ×1 IMPLANT
SPIKE FLUID TRANSFER (MISCELLANEOUS) ×4 IMPLANT
SUT ETHIBOND NAB CT1 #1 30IN (SUTURE) ×4 IMPLANT
SUT VIC AB 0 CT1 36 (SUTURE) ×2 IMPLANT
SUT VIC AB 2-0 CT1 27 (SUTURE) ×1
SUT VIC AB 2-0 CT1 TAPERPNT 27 (SUTURE) ×1 IMPLANT
SUT VICRYL AB 3-0 FS1 BRD 27IN (SUTURE) ×2 IMPLANT
SUT VLOC 180 0 24IN GS25 (SUTURE) ×2 IMPLANT
TIBIA ATTUNE KNEE SYS BASE SZ6 (Knees) ×2 IMPLANT
TRAY FOLEY MTR SLVR 16FR STAT (SET/KITS/TRAYS/PACK) IMPLANT
WATER STERILE IRR 1000ML POUR (IV SOLUTION) ×2 IMPLANT
WRAP KNEE MAXI GEL POST OP (GAUZE/BANDAGES/DRESSINGS) ×2 IMPLANT
YANKAUER SUCT BULB TIP NO VENT (SUCTIONS) ×2 IMPLANT

## 2021-12-18 NOTE — Op Note (Signed)
PREOP DIAGNOSIS: DJD LEFT KNEE ?POSTOP DIAGNOSIS:  same ?PROCEDURE: LEFT TKR ?ANESTHESIA: Spinal and MAC ?ATTENDING SURGEON: Hessie Dibble ?ASSISTANT: Loni Dolly PA ? ?INDICATIONS FOR PROCEDURE: Sarah Cooper is a 71 y.o. female who has struggled for a long time with pain due to degenerative arthritis of the left knee.  The patient has failed many conservative non-operative measures and at this point has pain which limits the ability to sleep and walk.  The patient is offered total knee replacement.  Informed operative consent was obtained after discussion of possible risks of anesthesia, infection, neurovascular injury, DVT, and death.  The importance of the post-operative rehabilitation protocol to optimize result was stressed extensively with the patient. ? ?SUMMARY OF FINDINGS AND PROCEDURE:  Sarah Cooper was taken to the operative suite where under the above anesthesia a left knee replacement was performed.  There were advanced degenerative changes and the bone quality was good.  We used the DePuy Attune system and placed size 4 femur, 6 tibia, 35 mm all polyethylene patella, and a size 8 mm spacer.  Loni Dolly PA-C assisted throughout and was invaluable to the completion of the case in that he helped retract and maintain exposure while I placed the components.  He also helped close thereby minimizing OR time.  The patient was admitted for appropriate post-op care to include perioperative antibiotics and mechanical and pharmacologic measures for DVT prophylaxis. ? ?DESCRIPTION OF PROCEDURE:  Sarah Cooper was taken to the operative suite where the above anesthesia was applied.  The patient was positioned supine and prepped and draped in normal sterile fashion.  An appropriate time out was performed.  After the administration of kefzol pre-op antibiotic the leg was elevated and exsanguinated and a tourniquet inflated.  A standard longitudinal incision was made on the anterior knee.   Dissection was carried down to the extensor mechanism.  All appropriate anti-infective measures were used including the pre-operative antibiotic, betadine impregnated drape, and closed hooded exhaust systems for each member of the surgical team.  A medial parapatellar incision was made in the extensor mechanism and the knee cap flipped and the knee flexed.  Some residual meniscal tissues were removed along with any remaining ACL/PCL tissue.  A guide was placed on the tibia and a flat cut was made on it's superior surface.  An intramedullary guide was placed in the femur and was utilized to make anterior and posterior cuts creating an appropriate flexion gap.  A second intramedullary guide was placed in the femur to make a distal cut properly balancing the knee with an extension gap equal to the flexion gap.  The three bones sized to the above mentioned sizes and the appropriate guides were placed and utilized.  A trial reduction was done and the knee easily came to full extension and the patella tracked well on flexion.  The trial components were removed and all bones were cleaned with pulsatile lavage and then dried thoroughly.  Cement was mixed and was pressurized onto the bones followed by placement of the aforementioned components.  Excess cement was trimmed and pressure was held on the components until the cement had hardened.  The tourniquet was deflated and a small amount of bleeding was controlled with cautery and pressure.  The knee was irrigated thoroughly.  The extensor mechanism was re-approximated with #1 ethibond in interrupted fashion.  The knee was flexed and the repair was solid.  The subcutaneous tissues were re-approximated with #0 and #2-0 vicryl and the  skin closed with a subcuticular stitch and steristrips.  A sterile dressing was applied.  Intraoperative fluids, EBL, and tourniquet time can be obtained from anesthesia records. ? ?DISPOSITION:  The patient was taken to recovery room in stable  condition and scheduled to potentially go home same day depending on ability to walk and tolerate liquids. ? ?Hessie Dibble ?12/18/2021, 11:46 AM  ?

## 2021-12-18 NOTE — Interval H&P Note (Signed)
History and Physical Interval Note: ? ?12/18/2021 ?9:13 AM ? ?Sarah Cooper  has presented today for surgery, with the diagnosis of LEFT KNEE DEGENERATIVE JOINT DISEASE.  The various methods of treatment have been discussed with the patient and family. After consideration of risks, benefits and other options for treatment, the patient has consented to  Procedure(s): ?LEFT TOTAL KNEE ARTHROPLASTY (Left) as a surgical intervention.  The patient's history has been reviewed, patient examined, no change in status, stable for surgery.  I have reviewed the patient's chart and labs.  Questions were answered to the patient's satisfaction.   ? ? ?Monico Blitz Alicea Wente ? ? ?

## 2021-12-18 NOTE — Anesthesia Procedure Notes (Signed)
Spinal ? ?Start time: 12/18/2021 10:09 AM ?End time: 12/18/2021 10:11 AM ?Staffing ?Performed: resident/CRNA  ?Anesthesiologist: Freddrick March, MD ?Resident/CRNA: Lind Covert, CRNA ?Preanesthetic Checklist ?Completed: patient identified, IV checked, site marked, risks and benefits discussed, surgical consent, monitors and equipment checked, pre-op evaluation and timeout performed ?Spinal Block ?Patient position: sitting ?Prep: DuraPrep ?Patient monitoring: heart rate, cardiac monitor, continuous pulse ox and blood pressure ?Approach: midline ?Location: L3-4 ?Injection technique: single-shot ?Needle ?Needle type: Pencan  ?Needle gauge: 24 G ?Needle length: 10 cm ?Needle insertion depth: 7 cm ?Assessment ?Sensory level: T6 ?Additional Notes ?Time out performed. Patient in sitting position. L3-4 Identified. !% Lidocaine 1 ml injected. SAB without difficulty. ? ? ? ?

## 2021-12-18 NOTE — Transfer of Care (Signed)
Immediate Anesthesia Transfer of Care Note ? ?Patient: Sarah Cooper ? ?Procedure(s) Performed: LEFT TOTAL KNEE ARTHROPLASTY (Left: Knee) ? ?Patient Location: PACU ? ?Anesthesia Type:Regional and Spinal ? ?Level of Consciousness: sedated ? ?Airway & Oxygen Therapy: Patient Spontanous Breathing and Patient connected to face mask oxygen ? ?Post-op Assessment: Report given to RN and Post -op Vital signs reviewed and stable ? ?Post vital signs: Reviewed and stable ? ?Last Vitals:  ?Vitals Value Taken Time  ?BP    ?Temp    ?Pulse    ?Resp 17 12/18/21 1212  ?SpO2    ?Vitals shown include unvalidated device data. ? ?Last Pain:  ?Vitals:  ? 12/18/21 0917  ?TempSrc:   ?PainSc: 0-No pain  ?   ? ?Patients Stated Pain Goal: 4 (12/18/21 0755) ? ?Complications: No notable events documented. ?

## 2021-12-18 NOTE — Anesthesia Procedure Notes (Addendum)
Anesthesia Regional Block: Adductor canal block  ? ?Pre-Anesthetic Checklist: , timeout performed,  Correct Patient, Correct Site, Correct Laterality,  Correct Procedure, Correct Position, site marked,  Risks and benefits discussed,  Surgical consent,  Pre-op evaluation,  At surgeon's request and post-op pain management ? ?Laterality: Left ? ?Prep: Maximum Sterile Barrier Precautions used, chloraprep     ?  ?Needles:  ?Injection technique: Single-shot ? ?Needle Type: Echogenic Stimulator Needle   ? ? ?Needle Length: 9cm  ?Needle Gauge: 22  ? ? ? ?Additional Needles: ? ? ?Procedures:,,,, ultrasound used (permanent image in chart),,    ?Narrative:  ?Start time: 12/18/2021 9:10 AM ?End time: 12/18/2021 9:14 AM ?Injection made incrementally with aspirations every 5 mL. ? ?Performed by: Personally  ?Anesthesiologist: Freddrick March, MD ? ?Additional Notes: ?Monitors applied. No increased pain on injection. No increased resistance to injection. Injection made in 5cc increments. Good needle visualization. Patient tolerated procedure well.  ? ? ? ? ?

## 2021-12-18 NOTE — Anesthesia Preprocedure Evaluation (Addendum)
Anesthesia Evaluation  ?Patient identified by MRN, date of birth, ID band ?Patient awake ? ? ? ?Reviewed: ?Allergy & Precautions, NPO status , Patient's Chart, lab work & pertinent test results ? ?History of Anesthesia Complications ?(+) PONV and history of anesthetic complications ? ?Airway ?Mallampati: III ? ?TM Distance: >3 FB ?Neck ROM: Full ? ? ? Dental ? ?(+) Edentulous Upper, Poor Dentition, Dental Advisory Given ?  ?Pulmonary ?neg pulmonary ROS, former smoker,  ?  ?Pulmonary exam normal ?breath sounds clear to auscultation ? ? ? ? ? ? Cardiovascular ?hypertension, Pt. on medications ?+CHF  ?Normal cardiovascular exam ?Rhythm:Regular Rate:Normal ? ? ?  ?Neuro/Psych ?PSYCHIATRIC DISORDERS Depression negative neurological ROS ?   ? GI/Hepatic ?negative GI ROS, Neg liver ROS,   ?Endo/Other  ?negative endocrine ROS ? Renal/GU ?Renal InsufficiencyRenal disease  ?negative genitourinary ?  ?Musculoskeletal ? ?(+) Arthritis ,  ? Abdominal ?  ?Peds ? Hematology ?negative hematology ROS ?(+)   ?Anesthesia Other Findings ? ? Reproductive/Obstetrics ? ?  ? ? ? ? ? ? ? ? ? ? ? ? ? ?  ?  ? ? ? ? ? ? ? ?Anesthesia Physical ?Anesthesia Plan ? ?ASA: 3 ? ?Anesthesia Plan: Spinal and Regional  ? ?Post-op Pain Management: Regional block* and Tylenol PO (pre-op)*  ? ?Induction:  ? ?PONV Risk Score and Plan: 3 and Treatment may vary due to age or medical condition, Ondansetron, TIVA and Dexamethasone ? ?Airway Management Planned: Natural Airway ? ?Additional Equipment:  ? ?Intra-op Plan:  ? ?Post-operative Plan:  ? ?Informed Consent: I have reviewed the patients History and Physical, chart, labs and discussed the procedure including the risks, benefits and alternatives for the proposed anesthesia with the patient or authorized representative who has indicated his/her understanding and acceptance.  ? ? ? ?Dental advisory given ? ?Plan Discussed with: CRNA ? ?Anesthesia Plan Comments:    ? ? ? ? ? ? ?Anesthesia Quick Evaluation ? ?

## 2021-12-18 NOTE — Evaluation (Signed)
Physical Therapy Evaluation Patient Details Name: Sarah Cooper MRN: [id number] DOB: Jun 16, 1951 Today's Date: 12/18/2021  History of Present Illness  Pt is 71 yo female s/p L TKA on 12/18/21.  Pt with hx including but not limited to HF, HTN, OA, back pain, obesity, LTHA 2021, R THA 2007  Clinical Impression  Pt is s/p TKA resulting in the deficits listed below (see PT Problem List). Pt seen in PACU for possible same day discharge.  At baseline, she is independent - she lives alone but daughter will be staying with her for a few days.  Daughter present during eval.  Pt had good pain control (3-5/10), quad activation (SLR no ext lag), and ROM 0 to 80 degrees.  She was able to ambulate 36' with RW and min guard and performed stairs similar to home setup (1 curb to enter).  Pt demonstrates safe gait & transfers in order to return home from PT perspective once discharged by MD.  While in hospital, will continue to benefit from PT for skilled therapy to advance mobility and exercises.            Recommendations for follow up therapy are one component of a multi-disciplinary discharge planning process, led by the attending physician.  Recommendations may be updated based on patient status, additional functional criteria and insurance authorization.  Follow Up Recommendations Follow physician's recommendations for discharge plan and follow up therapies    Assistance Recommended at Discharge Intermittent Supervision/Assistance  Patient can return home with the following  A little help with walking and/or transfers;Assistance with cooking/housework    Equipment Recommendations None recommended by PT  Recommendations for Other Services       Functional Status Assessment Patient has had a recent decline in their functional status and demonstrates the ability to make significant improvements in function in a reasonable and predictable amount of time.     Precautions / Restrictions  Precautions Precautions: Fall Restrictions Weight Bearing Restrictions: Yes LLE Weight Bearing: Weight bearing as tolerated      Mobility  Bed Mobility Overal bed mobility: Needs Assistance Bed Mobility: Supine to Sit, Sit to Supine     Supine to sit: Supervision Sit to supine: Min assist   General bed mobility comments: Min A for L LE back to bed    Transfers Overall transfer level: Needs assistance Equipment used: Rolling walker (2 wheels) Transfers: Sit to/from Stand Sit to Stand: Min guard           General transfer comment: Min guard for safety    Ambulation/Gait Ambulation/Gait assistance: Min guard Gait Distance (Feet): 80 Feet Assistive device: Rolling walker (2 wheels) Gait Pattern/deviations: Step-to pattern, Decreased stride length, Decreased stance time - left Gait velocity: decreased     General Gait Details: Cued on sequencing; min guard safety; steady gait  Stairs Stairs: Yes Stairs assistance: Min guard Stair Management: Two rails, With walker, Forwards Number of Stairs: 3 General stair comments: 2 steps with bil rails and top platform step with RW; cues for sequencing; performed without difficulty  Wheelchair Mobility    Modified Rankin (Stroke Patients Only)       Balance Overall balance assessment: Needs assistance Sitting-balance support: No upper extremity supported Sitting balance-Leahy Scale: Good     Standing balance support: Bilateral upper extremity supported, Reliant on assistive device for balance Standing balance-Leahy Scale: Fair Standing balance comment: Using RW for gait; could static stand no AD  Pertinent Vitals/Pain Pain Assessment Pain Assessment: 0-10 Pain Score: 3  (3/10 pre, 5/10 walk) Pain Location: L knee Pain Descriptors / Indicators: Discomfort Pain Intervention(s): Limited activity within patient's tolerance, Monitored during session, Ice applied    Home  Living Family/patient expects to be discharged to:: Private residence Living Arrangements: Alone Available Help at Discharge: Family;Available 24 hours/day (dtr and granddaughter) Type of Home: Apartment Home Access: Level entry (curb from parking lot)       Home Layout: One level Home Equipment: Shower seat;Toilet riser;Grab bars - tub/shower;Rolling Walker (2 wheels);Cane - single point      Prior Function Prior Level of Function : Driving;Independent/Modified Independent             Mobility Comments: Could ambulate in community; did use cane ADLs Comments: independent with ADLs and IADLs     Hand Dominance        Extremity/Trunk Assessment   Upper Extremity Assessment Upper Extremity Assessment: Overall WFL for tasks assessed    Lower Extremity Assessment Lower Extremity Assessment: LLE deficits/detail;RLE deficits/detail RLE Deficits / Details: ROM WFL; MMT 5/5 LLE Deficits / Details: ROM: ankle and hip WFL, knee 0 to 80 degrees; MMT: ankle 5/5, knee and hip 3/5    Cervical / Trunk Assessment Cervical / Trunk Assessment: Normal  Communication   Communication: No difficulties  Cognition Arousal/Alertness: Awake/alert Behavior During Therapy: WFL for tasks assessed/performed Overall Cognitive Status: Within Functional Limits for tasks assessed                                          General Comments  Educated on safe ice use, no pivots, car transfers, resting with leg straight. Also, encouraged walking every 1-2 hours during day. Educated on HEP with focus on mobility the first weeks. Discussed doing exercises within pain control and if pain increasing could decreased ROM, reps, and stop exercises as needed. Encouraged to perform quad sets and ankle pumps frequently for blood flow and to promote full knee extension.     Exercises Total Joint Exercises Ankle Circles/Pumps: AROM, Both, 10 reps, Supine Quad Sets: AROM, Both, 10 reps,  Supine Heel Slides: AAROM, Left, 10 reps, Supine Hip ABduction/ADduction: AAROM, Left, 10 reps, Supine Long Arc Quad: AROM, Left, 5 reps, Seated Knee Flexion: AROM, Left, 5 reps, Seated Goniometric ROM: L knee 0 to 80degrees Other Exercises Other Exercises: Educated on [employer] as needed   Assessment/Plan    PT Assessment Patient needs continued PT services  PT Problem List Decreased strength;Decreased mobility;Decreased safety awareness;Decreased range of motion;Decreased activity tolerance;Decreased balance;Decreased knowledge of use of DME;Pain       PT Treatment Interventions DME instruction;Therapeutic activities;Modalities;Gait training;Therapeutic exercise;Patient/family education;Stair training;Balance training;Functional mobility training    PT Goals (Current goals can be found in the Care Plan section)  Acute Rehab PT Goals Patient Stated Goal: return home PT Goal Formulation: With patient/family Time For Goal Achievement: 01/01/22 Potential to Achieve Goals: Good    Frequency 7X/week     Co-evaluation               AM-PAC PT "6 Clicks" Mobility  Outcome Measure Help needed turning from your back to your side while in a flat bed without using bedrails?: A Little Help needed moving from lying on your back to sitting on the side of a flat bed without using bedrails?: A Little Help needed moving to and  from a bed to a chair (including a wheelchair)?: A Little Help needed standing up from a chair using your arms (e.g., wheelchair or bedside chair)?: A Little Help needed to walk in hospital room?: A Little Help needed climbing 3-5 steps with a railing? : A Little 6 Click Score: 18    End of Session Equipment Utilized During Treatment: Gait belt Activity Tolerance: Patient tolerated treatment well Patient left: in bed;with call bell/phone within reach;with family/visitor present (in PACU) Nurse Communication: Mobility status PT Visit Diagnosis: Other  abnormalities of gait and mobility (R26.89);Muscle weakness (generalized) (M62.81)    Time: 7703-4035 PT Time Calculation (min) (ACUTE ONLY): 30 min   Charges:   PT Evaluation $PT Eval Low Complexity: 1 Low PT Treatments $Gait Training: 8-22 mins        Abran Richard, PT Acute Rehab Services Pager [phone] [facility] [place] 12/18/2021, 3:14 PM

## 2021-12-18 NOTE — Anesthesia Procedure Notes (Signed)
Date/Time: 12/18/2021 10:04 AM ?Performed by: Lind Covert, CRNA ?Oxygen Delivery Method: Simple face mask ? ? ? ? ?

## 2021-12-18 NOTE — Progress Notes (Signed)
Assisted Dr. Hulan Fray with left, ultrasound guided, adductor canal block. Side rails up, monitors on throughout procedure. See vital signs in flow sheet. Tolerated Procedure well. ? ?

## 2021-12-19 ENCOUNTER — Encounter (HOSPITAL_COMMUNITY): Payer: Self-pay | Admitting: Orthopaedic Surgery

## 2021-12-20 NOTE — Therapy (Signed)
OUTPATIENT PHYSICAL THERAPY LOWER EXTREMITY EVALUATION   Patient Name: Sarah Cooper MRN: [id number] DOB:[birth year], 71 y.o., female Today's Date: 12/21/21    Past Medical History:  Diagnosis Date   Arthritis    back, hips, knees   Cancer ([place]) 1978   cervical   CHF (congestive heart failure) ([place])    [employer] 11/28/2020   Hyperlipidemia 12/01/2020   Hypertension    Pneumothorax 1977   PONV (postoperative nausea and vomiting)    Past Surgical History:  Procedure Laterality Date   BREAST BIOPSY Left    approx @ 48 benign    collapsed lung on right      1970s   HERNIA REPAIR     age 65   HIP SURGERY  2007   right   JOINT REPLACEMENT  2007   TOTAL HIP ARTHROPLASTY Left 07/25/2020   Procedure: LEFT TOTAL HIP ARTHROPLASTY ANTERIOR APPROACH;  Surgeon: Melrose Nakayama, MD;  Location: [facility];  Service: Orthopedics;  Laterality: Left;   TOTAL KNEE ARTHROPLASTY Left 12/18/2021   Procedure: LEFT TOTAL KNEE ARTHROPLASTY;  Surgeon: Melrose Nakayama, MD;  Location: [facility];  Service: Orthopedics;  Laterality: Left;   Patient Active Problem List   Diagnosis Date Noted   Stage 3a chronic kidney disease ([place]) 10/05/2021   Muscle spasm 02/06/2021   Insomnia, unspecified 12/01/2020   Hyperlipidemia 12/01/2020   Chronic diastolic heart failure ([place]) 12/01/2020   Leg edema 12/01/2020   Primary localized osteoarthritis of left hip 07/25/2020   Tubular adenoma of colon 12/11/2016   Dyspnea 12/11/2016   Vitamin D deficiency 11/21/2016   Pre-diabetes 11/21/2016   Essential hypertension 11/19/2016   Depression, recurrent ([place]) 11/19/2016   Osteoarthritis 11/19/2016   Chronic back pain greater than 3 months duration 11/19/2016   History of cervical dysplasia 11/19/2016   Morbid obesity ([place]) 11/19/2016    PCP: Haywood Pao, MD  REFERRING PROVIDER: Melrose Nakayama, MD  REFERRING DIAG: PT EVAL/TX S/P L TOTAL KNEE REPL PER PETER DALLDORF, MD DOS ON 12/18/21 PLEASE [facility]  12/21/21  THERAPY DIAG:  OA L knee  ONSET DATE: s/p 12/18/21  SUBJECTIVE: PT EVAL/TX S/P L TOTAL KNEE REPL PER PETER DALLDORF, MD DOS ON 12/18/21   SUBJECTIVE STATEMENT: Patient reports Left knee arthritis, s/p Left TKA 12/18/21.  Patient lives alone in apartment with no steps to enter and exit. Her daughter is staying with her temporarily.  She has a follow up appointment with surgeon 12/28/21.  Patient states she is in quite a bit of pain today.  "This is much worse than my hip was".   PERTINENT HISTORY: Right Hip THA 2007, Left hip THA October 2021, still has some soreness L hip  PAIN:  Are you having pain? Yes NPRS scale: 9/10 Pain location: L knee Pain orientation: Left  PAIN TYPE: aching Pain description: constant  Aggravating factors: movement Relieving factors: ice, pain med  PRECAUTIONS: Fall  WEIGHT BEARING RESTRICTIONS No  FALLS:  Has patient fallen in last 6 months? No, Number of falls: 0  LIVING ENVIRONMENT: Lives with: lives alone Lives in: House/apartment Stairs: No;  Has following equipment at home: Single point cane, Environmental consultant - 2 wheeled, Manufacturing engineer  OCCUPATION: retired  PLOF: Independent  PATIENT GOALS : be independent, water aeorobics   OBJECTIVE:   PATIENT SURVEYS:  FOTO 21  COGNITION:  Overall cognitive status: Within functional limits for tasks assessed     SENSATION:  Light touch: Appears intact   Proprioception: Appears intact  PALPATION: Tenderness  at and around knee joint  LE AROM/PROM:  A/PROM Right 12/20/2021 Left 12/21/21  Hip flexion    Hip extension    Hip abduction    Hip adduction    Hip internal rotation    Hip external rotation    Knee flexion  62  Knee extension  -15  Ankle dorsiflexion    Ankle plantarflexion    Ankle inversion    Ankle eversion     (Blank rows = not tested)  LE MMT:  MMT Right 12/20/2021 Left 12/20/2021  Hip flexion    Hip extension    Hip abduction    Hip adduction    Hip internal  rotation    Hip external rotation    Knee flexion    Knee extension  Poor quad set  Ankle dorsiflexion    Ankle plantarflexion    Ankle inversion    Ankle eversion     (Blank rows = not tested)   FUNCTIONAL TESTS:  30 seconds chair stand test x 2  GAIT: Distance walked: 75 Assistive device utilized: Environmental consultant - 2 wheeled Level of assistance: SBA Comments: slow antalgic gait noted; decreased stance left lower extremity, decreased heel strike, toe off    TODAY'S TREATMENT: PT evaluation, HEP instruction   PATIENT EDUCATION:  Education details: HEP, gait training, ice for pain management, walking hourly Person educated: Patient Education method: Explanation, Demonstration, and Handouts Education comprehension: verbalized understanding, returned demonstration, and needs further education   HOME EXERCISE PROGRAM: Access Code: [email or web address] URL: [email or web address] Date: 12/21/2021 Prepared by: [facility] - Rehab  Exercises Seated Heel Slide - 1 x daily - 7 x weekly - 1 sets - 10 reps Supine Quad Set - 1 x daily - 7 x weekly - 1 sets - 10 reps Supine Gluteal Sets - 1 x daily - 7 x weekly - 1 sets - 10 reps Supine Ankle Pumps - 1 x daily - 7 x weekly - 2 sets - 10 reps Supine Heel Slide with Strap - 1 x daily - 7 x weekly - 1 sets - 10 reps   ASSESSMENT:  CLINICAL IMPRESSION: Patient is a 71 y.o. female who was seen today for physical therapy evaluation and treatment for s/p L TKA 12/19/19.    OBJECTIVE IMPAIRMENTS Abnormal gait, decreased activity tolerance, decreased balance, decreased endurance, decreased mobility, difficulty walking, decreased ROM, decreased strength, decreased safety awareness, increased edema, impaired perceived functional ability, increased muscle spasms, impaired flexibility, and pain.   ACTIVITY LIMITATIONS cleaning, community activity, driving, meal prep, laundry, yard work, shopping, and yard work.   PERSONAL FACTORS Fitness are also  affecting patient's functional outcome.    REHAB POTENTIAL: Good  CLINICAL DECISION MAKING: Stable/uncomplicated  EVALUATION COMPLEXITY: Low   GOALS: Goals reviewed with patient? Yes  SHORT TERM GOALS:  Patient will be independent in initial HEP to improve functional outcomes.  Target date: 01/04/2022 Goal status: INITIAL  2.  Patient will improve Left  knee AAROM flexion in sitting to 90 degrees to improve functional mobility in home Baseline: 62 Target date: 01/04/2022 Goal status: INITIAL  LONG TERM GOALS:  Long term goal: Patient will be independent with advanced HEP and self management strategies to improve quality of life and functional outcomes.  Target date: 01/18/2022 Goal status: INITIAL  2.  Patient will meet predicted FOTO score to demonstrate improved overall function.  Baseline: 21 Target date: 01/18/2022 Goal status: INITIAL  3.  Patient will report at least 50% improvement in overall symptoms and/or function to  demonstrate improved functional mobility  Target date: 01/18/2022 Goal status: INITIAL  4.  Long term goal: Patient will demonstrate improved Left knee AROM to -5 to 110  to be able to safely navigate community surfaces.   Baseline: Left knee ext -15, Left knee flex 62 Target date: 01/18/2022 Goal status: INITIAL  5.  Long term goal: Patient will increase Left knee MMTs to 5/5 to promote return to ambulation community distances with minimal deviation  Target date: 01/18/2022 Goal status: INITIAL  6.  Patient will improve 30 sec STS score from 2  to 5  to demonstrate improved functional mobility and increased lower extremity strength.  Baseline: 2 Target date: 01/18/2022 Goal status: INITIAL  PLAN: PT FREQUENCY: 3x/week  PT DURATION: 4 weeks  PLANNED INTERVENTIONS: Therapeutic exercises, Therapeutic activity, Neuromuscular re-education, Balance training, Gait training, Patient/Family education, Joint manipulation, Joint mobilization, Stair  training, DME instructions, Aquatic Therapy, Electrical stimulation, Cryotherapy, scar mobilization, Taping, and Manual therapy  PLAN FOR NEXT SESSION: review and progress HEP, progress to seated and standing exercises as able.    9:23 AM, 12/21/21 Theodoros Stjames Small [facility] [place] [phone]

## 2021-12-20 NOTE — Anesthesia Postprocedure Evaluation (Signed)
Anesthesia Post Note ? ?Patient: Sarah Cooper ? ?Procedure(s) Performed: LEFT TOTAL KNEE ARTHROPLASTY (Left: Knee) ? ?  ? ?Patient location during evaluation: PACU ?Anesthesia Type: Regional and Spinal ?Level of consciousness: oriented and awake and alert ?Pain management: pain level controlled ?Vital Signs Assessment: post-procedure vital signs reviewed and stable ?Respiratory status: spontaneous breathing, respiratory function stable and patient connected to nasal cannula oxygen ?Cardiovascular status: blood pressure returned to baseline and stable ?Postop Assessment: no headache, no backache and no apparent nausea or vomiting ?Anesthetic complications: no ? ? ?No notable events documented. ? ?Last Vitals:  ?Vitals:  ? 12/18/21 1400 12/18/21 1515  ?BP: 111/66 129/73  ?Pulse: 90 64  ?Resp:  15  ?Temp:    ?SpO2: 97% 100%  ?  ?Last Pain:  ?Vitals:  ? 12/18/21 1515  ?TempSrc:   ?PainSc: 5   ? ? ?  ?  ?  ?  ?  ?  ? ?[place] L Blayne Frankie ? ? ? ? ?

## 2021-12-21 ENCOUNTER — Ambulatory Visit (HOSPITAL_COMMUNITY): Payer: Medicare Other | Attending: Orthopaedic Surgery

## 2021-12-21 ENCOUNTER — Other Ambulatory Visit: Payer: Self-pay

## 2021-12-21 DIAGNOSIS — M25552 Pain in left hip: Secondary | ICD-10-CM | POA: Insufficient documentation

## 2021-12-21 DIAGNOSIS — M25662 Stiffness of left knee, not elsewhere classified: Secondary | ICD-10-CM | POA: Insufficient documentation

## 2021-12-21 DIAGNOSIS — M6281 Muscle weakness (generalized): Secondary | ICD-10-CM | POA: Insufficient documentation

## 2021-12-21 DIAGNOSIS — R600 Localized edema: Secondary | ICD-10-CM | POA: Diagnosis present

## 2021-12-21 DIAGNOSIS — R262 Difficulty in walking, not elsewhere classified: Secondary | ICD-10-CM | POA: Insufficient documentation

## 2021-12-21 DIAGNOSIS — R2689 Other abnormalities of gait and mobility: Secondary | ICD-10-CM | POA: Insufficient documentation

## 2021-12-21 DIAGNOSIS — M25562 Pain in left knee: Secondary | ICD-10-CM | POA: Diagnosis not present

## 2021-12-24 ENCOUNTER — Ambulatory Visit (HOSPITAL_COMMUNITY): Payer: Medicare Other

## 2021-12-24 ENCOUNTER — Other Ambulatory Visit: Payer: Self-pay

## 2021-12-24 ENCOUNTER — Encounter (HOSPITAL_COMMUNITY): Payer: Self-pay

## 2021-12-24 DIAGNOSIS — M25552 Pain in left hip: Secondary | ICD-10-CM

## 2021-12-24 DIAGNOSIS — M25662 Stiffness of left knee, not elsewhere classified: Secondary | ICD-10-CM

## 2021-12-24 DIAGNOSIS — M25562 Pain in left knee: Secondary | ICD-10-CM | POA: Diagnosis not present

## 2021-12-24 DIAGNOSIS — R262 Difficulty in walking, not elsewhere classified: Secondary | ICD-10-CM

## 2021-12-24 DIAGNOSIS — R600 Localized edema: Secondary | ICD-10-CM

## 2021-12-24 DIAGNOSIS — R2689 Other abnormalities of gait and mobility: Secondary | ICD-10-CM

## 2021-12-24 NOTE — Progress Notes (Signed)
OUTPATIENT OCCUPATIONAL THERAPY TREATMENT NOTE   Patient Name: Sarah Cooper MRN: [id number] DOB:June 03, 1951, 71 y.o., female Today's Date: 12/24/2021  PCP: Haywood Pao, MD REFERRING PROVIDER: Haywood Pao, MD    Past Medical History:  Diagnosis Date   Arthritis    back, hips, knees   Cancer ([place]) 1978   cervical   CHF (congestive heart failure) ([place])    Phreesia 11/28/2020   Hyperlipidemia 12/01/2020   Hypertension    Pneumothorax 1977   PONV (postoperative nausea and vomiting)    Past Surgical History:  Procedure Laterality Date   BREAST BIOPSY Left    approx @ 48 benign    collapsed lung on right      1970s   HERNIA REPAIR     age 17   HIP SURGERY  2007   right   JOINT REPLACEMENT  2007   TOTAL HIP ARTHROPLASTY Left 07/25/2020   Procedure: LEFT TOTAL HIP ARTHROPLASTY ANTERIOR APPROACH;  Surgeon: Melrose Nakayama, MD;  Location: [facility];  Service: Orthopedics;  Laterality: Left;   TOTAL KNEE ARTHROPLASTY Left 12/18/2021   Procedure: LEFT TOTAL KNEE ARTHROPLASTY;  Surgeon: Melrose Nakayama, MD;  Location: [facility];  Service: Orthopedics;  Laterality: Left;   Patient Active Problem List   Diagnosis Date Noted   Stage 3a chronic kidney disease ([place]) 10/05/2021   Muscle spasm 02/06/2021   Insomnia, unspecified 12/01/2020   Hyperlipidemia 12/01/2020   Chronic diastolic heart failure ([place]) 12/01/2020   Leg edema 12/01/2020   Primary localized osteoarthritis of left hip 07/25/2020   Tubular adenoma of colon 12/11/2016   Dyspnea 12/11/2016   Vitamin D deficiency 11/21/2016   Pre-diabetes 11/21/2016   Essential hypertension 11/19/2016   Depression, recurrent ([place]) 11/19/2016   Osteoarthritis 11/19/2016   Chronic back pain greater than 3 months duration 11/19/2016   History of cervical dysplasia 11/19/2016   Morbid obesity ([place]) 11/19/2016    ONSET DATE: S/P L TOTAL KNEE REPL PER Melrose Nakayama, MD DOS ON 12/18/21   REFERRING DIAG: PCP: Haywood Pao, MD   REFERRING PROVIDER: Melrose Nakayama, MD   REFERRING DIAG: PT EVAL/TX S/P L TOTAL KNEE REPL [place], MD DOS ON 12/18/21 PLEASE [facility] 12/21/21  THERAPY DIAG:  Acute pain of left knee  Stiffness of left knee, not elsewhere classified  Other abnormalities of gait and mobility  Localized edema  Difficulty in walking, not elsewhere classified  Pain in left hip   PERTINENT HISTORY: Right Hip THA 2007, Left hip THA October 2021, still has some soreness L hip   PRECAUTIONS: None,   fall risk   SUBJECTIVE: Patient is having a tough morning, very nauseous and not feeling well overall. Did HEP but is struggling this am with her knee very painful and stiff.  PAIN:  Are you having pain? Yes: NPRS scale: 7/10 Pain location: left knee Pain description: burning, throbbing Aggravating factors: prolonged positioning Relieving factors: gentle movement    OBJECTIVE:    PATIENT SURVEYS:  FOTO 21 at eval   COGNITION:          Overall cognitive status: Within functional limits for tasks assessed                        SENSATION:          Light touch: Appears intact                    Proprioception: Appears intact  PALPATION: Tenderness at and around knee joint   LE AROM/PROM:   A/PROM Right 12/20/2021 Left 12/21/21 Left 12/24/21  Hip flexion       Hip extension       Hip abduction       Hip adduction       Hip internal rotation       Hip external rotation       Knee flexion   62 50  Knee extension   -15 -5  Ankle dorsiflexion       Ankle plantarflexion       Ankle inversion       Ankle eversion        (Blank rows = not tested)   LE MMT:   MMT Right 12/20/2021 Left 12/20/2021  Hip flexion      Hip extension      Hip abduction      Hip adduction      Hip internal rotation      Hip external rotation      Knee flexion      Knee extension   Poor quad set  Ankle dorsiflexion      Ankle plantarflexion      Ankle inversion      Ankle eversion        (Blank rows = not tested)     FUNCTIONAL TESTS:  30 seconds chair stand test x 2   at eval 3/10   GAIT: Distance walked: 75 Assistive device utilized: Environmental consultant - 2 wheeled Level of assistance: SBA Comments: slow antalgic gait noted; decreased stance left lower extremity, decreased heel strike, toe off       TODAY'S TREATMENT: 12/21/21 = PT evaluation, HEP instruction  12/24/21:   Manual = Supine posterior L knee edema massage, pre jt circ. L 55.cm verse R 50 cm;  trial of grade 1 joint mobilization PA rocking   Supine positioning for prolonged 10 min extension hold during manual with towel roll under heel  Elevated SLR  Therapeutic Exercises =    Seated = heel slides 2 x10 reps    Heel/toe raise 2 x 10 reps    Glut set 2 x 10 reps   Supine = quad set 2 x 10 reps    Heel slides AAROM 2 x 10 reps    Elevated with DPT holding SLR - ankle pumps 2 x 10   12/24/21: Discussion and Education for continued post operative status and HEP consistent use including increased to 2 x per day and more often heel toe movement. Continue use of ice post therapy    PATIENT EDUCATION:  Education details: HEP, gait training, ice for pain management, walking hourly Person educated: Patient Education method: Explanation, Demonstration, and Handouts Education comprehension: verbalized understanding, returned demonstration, and needs further education     HOME EXERCISE PROGRAM: Access Code: [email or web address] URL: [email or web address] Date: 12/21/2021 Prepared by: [facility] - Rehab   Exercises Seated Heel Slide - 1 x daily - 7 x weekly - 1 sets - 10 reps Supine Quad Set - 1 x daily - 7 x weekly - 1 sets - 10 reps Supine Gluteal Sets - 1 x daily - 7 x weekly - 1 sets - 10 reps Supine Ankle Pumps - 1 x daily - 7 x weekly - 2 sets - 10 reps Supine Heel Slide with Strap - 1 x daily - 7 x weekly - 1 sets - 10 reps     ASSESSMENT:   CLINICAL IMPRESSION: 12/24/21 =  A:  Patient attending first full  treatment session after evaluation with good review of HEP. Todays session continued to build on prior evaluation with primary focus on R knee ROM secondary to continued pain, stiffness and edema. Patient demonstrated poor ROM at initiation of session with increased statement of feeling stiff today with cold weather and not feeling well, however showed good form on HEP and was able to tolerate session well.  Needs continued work on relaxing through passive actions to allow movement.  Patient is a good candidate to continue with skilled physical therapy to progress current needs of post operative recovery while improving functional status as able.      OBJECTIVE IMPAIRMENTS Abnormal gait, decreased activity tolerance, decreased balance, decreased endurance, decreased mobility, difficulty walking, decreased ROM, decreased strength, decreased safety awareness, increased edema, impaired perceived functional ability, increased muscle spasms, impaired flexibility, and pain.    ACTIVITY LIMITATIONS cleaning, community activity, driving, meal prep, laundry, yard work, shopping, and yard work.    PERSONAL FACTORS Fitness are also affecting patient's functional outcome.      REHAB POTENTIAL: Good   CLINICAL DECISION MAKING: Stable/uncomplicated   EVALUATION COMPLEXITY: Low     GOALS: Goals reviewed with patient? Yes   SHORT TERM GOALS:   Patient will be independent in initial HEP to improve functional outcomes.   Target date: 01/04/2022 Goal status: INITIAL   2.  Patient will improve Left  knee AAROM flexion in sitting to 90 degrees to improve functional mobility in home Baseline: 62 Target date: 01/04/2022 Goal status: INITIAL   LONG TERM GOALS:   Long term goal: Patient will be independent with advanced HEP and self management strategies to improve quality of life and functional outcomes.   Target date: 01/18/2022 Goal status: INITIAL   2.  Patient will meet predicted FOTO score to  demonstrate improved overall function.   Baseline: 21 Target date: 01/18/2022 Goal status: INITIAL   3.  Patient will report at least 50% improvement in overall symptoms and/or function to demonstrate improved functional mobility   Target date: 01/18/2022 Goal status: INITIAL   4.  Long term goal: Patient will demonstrate improved Left knee AROM to -5 to 110  to be able to safely navigate community surfaces.    Baseline: Left knee ext -15, Left knee flex 62 Target date: 01/18/2022 Goal status: INITIAL   5.  Long term goal: Patient will increase Left knee MMTs to 5/5 to promote return to ambulation community distances with minimal deviation   Target date: 01/18/2022 Goal status: INITIAL   6.  Patient will improve 30 sec STS score from 2  to 5  to demonstrate improved functional mobility and increased lower extremity strength.   Baseline: 2 Target date: 01/18/2022 Goal status: INITIAL   PLAN: PT FREQUENCY: 3x/week   PT DURATION: 4 weeks   PLANNED INTERVENTIONS: Therapeutic exercises, Therapeutic activity, Neuromuscular re-education, Balance training, Gait training, Patient/Family education, Joint manipulation, Joint mobilization, Stair training, DME instructions, Aquatic Therapy, Electrical stimulation, Cryotherapy, scar mobilization, Taping, and Manual therapy   PLAN FOR NEXT SESSION: Continue to review and progress HEP, progress to seated and standing exercises as able.  Progress knee flexion heel slides with overpressure as able; trial nu-step for continuous movement as able     9:08 AM, 12/24/21  Margarette Asal. Carlis Abbott PT, DPT  Contract Physical Therapist at  [facility] Victoria Vera, PT 12/24/2021, 8:17 AM

## 2021-12-25 NOTE — Therapy (Signed)
OUTPATIENT PHYSICAL THERAPY TREATMENT NOTE   Patient Name: Sarah Cooper MRN: [id number] DOB:12/15/1950, 71 y.o., female Today's Date: 12/26/2021  PCP: Gaspar Garbe, MD REFERRING PROVIDER: Marcene Corning, MD   PT End of Session - 12/26/21 0905     Visit Number 3    Number of Visits 12    Date for PT Re-Evaluation 01/18/22    Authorization Type [employer] Medicare; copay 20.00    Authorization Time Period 10/14/21 to 10/13/22; no auth required follow MCR guidelines    Authorization - Visit Number 3    Progress Note Due on Visit 12    PT Start Time 0824    PT Stop Time 0904    PT Time Calculation (min) 40 min    Activity Tolerance Patient tolerated treatment well    Behavior During Therapy [facility] for tasks assessed/performed             Past Medical History:  Diagnosis Date   Arthritis    back, hips, knees   Cancer ([facility]) 1978   cervical   CHF (congestive heart failure) ([facility])    Phreesia 11/28/2020   Hyperlipidemia 12/01/2020   Hypertension    Pneumothorax 1977   PONV (postoperative nausea and vomiting)    Past Surgical History:  Procedure Laterality Date   BREAST BIOPSY Left    approx @ 48 benign    collapsed lung on right      1970s   HERNIA REPAIR     age 70   HIP SURGERY  2007   right   JOINT REPLACEMENT  2007   TOTAL HIP ARTHROPLASTY Left 07/25/2020   Procedure: LEFT TOTAL HIP ARTHROPLASTY ANTERIOR APPROACH;  Surgeon: Marcene Corning, MD;  [facility];  Service: Orthopedics;  Laterality: Left;   TOTAL KNEE ARTHROPLASTY Left 12/18/2021   Procedure: LEFT TOTAL KNEE ARTHROPLASTY;  Surgeon: Marcene Corning, MD;  [facility];  Service: Orthopedics;  Laterality: Left;   Patient Active Problem List   Diagnosis Date Noted   Stage 3a chronic kidney disease ([facility]) 10/05/2021   Muscle spasm 02/06/2021   Insomnia, unspecified 12/01/2020   Hyperlipidemia 12/01/2020   Chronic diastolic heart failure ([facility]) 12/01/2020   Leg edema 12/01/2020   Primary  localized osteoarthritis of left hip 07/25/2020   Tubular adenoma of colon 12/11/2016   Dyspnea 12/11/2016   Vitamin D deficiency 11/21/2016   Pre-diabetes 11/21/2016   Essential hypertension 11/19/2016   Depression, recurrent ([facility]) 11/19/2016   Osteoarthritis 11/19/2016   Chronic back pain greater than 3 months duration 11/19/2016   History of cervical dysplasia 11/19/2016   Morbid obesity ([facility]) 11/19/2016    Patient Name: Sarah Cooper MRN: [id number] DOB:1951-08-22, 71 y.o., female Today's Date: 12/21/21           Past Medical History:  Diagnosis Date   Arthritis      back, hips, knees   Cancer ([facility]) 1978    cervical   CHF (congestive heart failure) ([facility])      Phreesia 11/28/2020   Hyperlipidemia 12/01/2020   Hypertension     Pneumothorax 1977   PONV (postoperative nausea and vomiting)           Past Surgical History:  Procedure Laterality Date   BREAST BIOPSY Left      approx @ 48 benign    collapsed lung on right         1970s   HERNIA REPAIR        age 68   HIP SURGERY  2007    right   JOINT REPLACEMENT   2007   TOTAL HIP ARTHROPLASTY Left 07/25/2020    Procedure: LEFT TOTAL HIP ARTHROPLASTY ANTERIOR APPROACH;  Surgeon: Marcene Corning, MD;  [facility];  Service: Orthopedics;  Laterality: Left;   TOTAL KNEE ARTHROPLASTY Left 12/18/2021    Procedure: LEFT TOTAL KNEE ARTHROPLASTY;  Surgeon: Marcene Corning, MD;  [facility];  Service: Orthopedics;  Laterality: Left;        Patient Active Problem List    Diagnosis Date Noted   Stage 3a chronic kidney disease ([facility]) 10/05/2021   Muscle spasm 02/06/2021   Insomnia, unspecified 12/01/2020   Hyperlipidemia 12/01/2020   Chronic diastolic heart failure ([facility]) 12/01/2020   Leg edema 12/01/2020   Primary localized osteoarthritis of left hip 07/25/2020   Tubular adenoma of colon 12/11/2016   Dyspnea 12/11/2016   Vitamin D deficiency 11/21/2016   Pre-diabetes 11/21/2016   Essential hypertension  11/19/2016   Depression, recurrent ([facility]) 11/19/2016   Osteoarthritis 11/19/2016   Chronic back pain greater than 3 months duration 11/19/2016   History of cervical dysplasia 11/19/2016   Morbid obesity ([facility]) 11/19/2016  SUBJECTIVE: Patient reports continued pain and soreness Left knee.  She states compliance with HEP.  She is concerned about continued swelling in the knee  PAIN:  Are you having pain? Yes: NPRS scale: 7/10 Pain location: Left knee Pain description: sore, tender, stiff Aggravating factors: movement Relieving factors: ice, medication     PCP: Tisovec, Adelfa Koh, MD   REFERRING PROVIDER: Marcene Corning, MD   REFERRING DIAG: PT EVAL/TX S/P L TOTAL KNEE REPL PER PETER DALLDORF, MD DOS ON 12/18/21 PLEASE [facility] 12/21/21   THERAPY DIAG:  OA L knee   ONSET DATE: s/p 12/18/21     PERTINENT HISTORY: Right Hip THA 2007, Left hip THA October 2021, still has some soreness L hip   PRECAUTIONS: Fall   WEIGHT BEARING RESTRICTIONS No   FALLS:  Has patient fallen in last 6 months? No, Number of falls: 0   LIVING ENVIRONMENT: Lives with: lives alone Lives in: House/apartment Stairs: No;  Has following equipment at home: Single point cane, Environmental consultant - 2 wheeled, Marine scientist   OCCUPATION: retired   PLOF: Independent   PATIENT GOALS : be independent, water aeorobics     OBJECTIVE:    PATIENT SURVEYS:  FOTO 21   COGNITION:          Overall cognitive status: Within functional limits for tasks assessed                        SENSATION:          Light touch: Appears intact                    Proprioception: Appears intact   PALPATION: Tenderness at and around knee joint   LE AROM/PROM:   A/PROM Right 12/20/2021 Left 12/21/21 Left 12/26/21  Hip flexion       Hip extension       Hip abduction       Hip adduction       Hip internal rotation       Hip external rotation       Knee flexion   62 64 AA in sitting  Knee extension   -15   Ankle dorsiflexion        Ankle plantarflexion       Ankle inversion  Ankle eversion        (Blank rows = not tested)   LE MMT:   MMT Right 12/20/2021 Left 12/20/2021  Hip flexion      Hip extension      Hip abduction      Hip adduction      Hip internal rotation      Hip external rotation      Knee flexion      Knee extension   Poor quad set  Ankle dorsiflexion      Ankle plantarflexion      Ankle inversion      Ankle eversion       (Blank rows = not tested)     FUNCTIONAL TESTS:  30 seconds chair stand test x 2   GAIT: Distance walked: 75 Assistive device utilized: Environmental consultant - 2 wheeled Level of assistance: SBA Comments: slow antalgic gait noted; decreased stance left lower extremity, decreased heel strike, toe off       TODAY'S TREATMENT: Seated heel slides x 10, with overpressure x 5 Seated heel/toe raises x 10 Seated LAQs with assistance x 5 Supine quad sets x 10 Supine glute sets x 10  Supine knee ext hang x 1 min Supine hip ABD with assistance x 10 Supine heel slide with strap and assistance x 5 Sitting AAROM KF on elevated EOB x 1 min Nustep seat 10, arms 12 x 5 min      PATIENT EDUCATION:  Education details: HEP, gait training, ice for pain management, walking hourly Person educated: Patient Education method: Explanation, Demonstration, and Handouts Education comprehension: verbalized understanding, returned demonstration, and needs further education     HOME EXERCISE PROGRAM: Access Code: [email or web address] URL: [email or web address] Date: 12/21/2021 Prepared by: [facility] - Rehab   Exercises Seated Heel Slide - 1 x daily - 7 x weekly - 1 sets - 10 reps Supine Quad Set - 1 x daily - 7 x weekly - 1 sets - 10 reps Supine Gluteal Sets - 1 x daily - 7 x weekly - 1 sets - 10 reps Supine Ankle Pumps - 1 x daily - 7 x weekly - 2 sets - 10 reps Supine Heel Slide with Strap - 1 x daily - 7 x weekly - 1 sets - 10 reps  Access Code: [email or web address] URL:  [email or web address] Date: 12/26/2021 Prepared by: [facility] - Rehab  Exercises Seated Heel Toe Raises - 2-3 x daily - 7 x weekly - 1 sets - 10 reps Supine Hip Abduction - 2-3 x daily - 7 x weekly - 1 sets - 10 reps Seated Long Arc Quad - 2-3 x daily - 7 x weekly - 1 sets - 10 reps Seated Knee Flexion AAROM - 2-3 x daily - 7 x weekly - 1 sets - 10 reps      ASSESSMENT:   CLINICAL IMPRESSION: Patient continues with significant complaints of pain and swelling normal for this time s/p left TKA. She has noticeable swelling but knee and surrounding tissues are soft to palpation.  Therapist discussed with patient s/s of blood clot and she vu. No s/s of blood clot today.  Therapy session today continued to address Left knee mobility and progressed her strengthening exercises today in treatment and with HEP.  Patient has a follow up with her surgeon Friday 3/17. Patient will benefit from continued skilled PT services to address deficits and promote return to optimal functional mobility.    OBJECTIVE IMPAIRMENTS Abnormal gait, decreased activity tolerance, decreased balance, decreased endurance, decreased  mobility, difficulty walking, decreased ROM, decreased strength, decreased safety awareness, increased edema, impaired perceived functional ability, increased muscle spasms, impaired flexibility, and pain.    ACTIVITY LIMITATIONS cleaning, community activity, driving, meal prep, laundry, yard work, shopping, and yard work.    PERSONAL FACTORS Fitness are also affecting patient's functional outcome.      REHAB POTENTIAL: Good   CLINICAL DECISION MAKING: Stable/uncomplicated   EVALUATION COMPLEXITY: Low     GOALS: Goals reviewed with patient? Yes   SHORT TERM GOALS:   Patient will be independent in initial HEP to improve functional outcomes.   Target date: 01/04/2022 Goal status: INITIAL   2.  Patient will improve Left  knee AAROM flexion in sitting to 90 degrees to improve  functional mobility in home Baseline: 62 Target date: 01/04/2022 Goal status: INITIAL   LONG TERM GOALS:   Long term goal: Patient will be independent with advanced HEP and self management strategies to improve quality of life and functional outcomes.   Target date: 01/18/2022 Goal status: INITIAL   2.  Patient will meet predicted FOTO score to demonstrate improved overall function.   Baseline: 21 Target date: 01/18/2022 Goal status: INITIAL   3.  Patient will report at least 50% improvement in overall symptoms and/or function to demonstrate improved functional mobility   Target date: 01/18/2022 Goal status: INITIAL   4.  Long term goal: Patient will demonstrate improved Left knee AROM to -5 to 110  to be able to safely navigate community surfaces.    Baseline: Left knee ext -15, Left knee flex 62 Target date: 01/18/2022 Goal status: INITIAL   5.  Long term goal: Patient will increase Left knee MMTs to 5/5 to promote return to ambulation community distances with minimal deviation   Target date: 01/18/2022 Goal status: INITIAL   6.  Patient will improve 30 sec STS score from 2  to 5  to demonstrate improved functional mobility and increased lower extremity strength.   Baseline: 2 Target date: 01/18/2022 Goal status: INITIAL   PLAN: PT FREQUENCY: 3x/week   PT DURATION: 4 weeks   PLANNED INTERVENTIONS: Therapeutic exercises, Therapeutic activity, Neuromuscular re-education, Balance training, Gait training, Patient/Family education, Joint manipulation, Joint mobilization, Stair training, DME instructions, Aquatic Therapy, Electrical stimulation, Cryotherapy, scar mobilization, Taping, and Manual therapy   PLAN FOR NEXT SESSION: progress Left knee mobility and strengthening as able. Progress to standing exercises as able, TKE's,  heel raises Patient has follow up appt with her surgeon on Friday 12/28/21.           10:39 AM, 12/26/21 Sheala Dosh Small Mikaila Grunert MPT [facility] [place] [phone] [phone]

## 2021-12-26 ENCOUNTER — Other Ambulatory Visit: Payer: Self-pay

## 2021-12-26 ENCOUNTER — Ambulatory Visit (HOSPITAL_COMMUNITY): Payer: Medicare Other

## 2021-12-26 DIAGNOSIS — M25562 Pain in left knee: Secondary | ICD-10-CM | POA: Diagnosis not present

## 2021-12-26 DIAGNOSIS — M25552 Pain in left hip: Secondary | ICD-10-CM

## 2021-12-26 DIAGNOSIS — M25662 Stiffness of left knee, not elsewhere classified: Secondary | ICD-10-CM

## 2021-12-26 DIAGNOSIS — R2689 Other abnormalities of gait and mobility: Secondary | ICD-10-CM

## 2021-12-26 DIAGNOSIS — R262 Difficulty in walking, not elsewhere classified: Secondary | ICD-10-CM

## 2021-12-26 DIAGNOSIS — R600 Localized edema: Secondary | ICD-10-CM

## 2021-12-26 NOTE — Therapy (Signed)
?OUTPATIENT PHYSICAL THERAPY TREATMENT NOTE ? ? ?Patient Name: Sarah Cooper ?MRN: [id number] ?DOB:February 09, 1951, 72 y.o., female ?Today's Date: 12/26/2021 ? ?PCP: Haywood Pao, MD ?REFERRING PROVIDER: Tisovec, Fransico Him, MD ? ? ? ?Past Medical History:  ?Diagnosis Date  ? Arthritis   ? back, hips, knees  ? Cancer [facility]) 1978  ? cervical  ? CHF (congestive heart failure) ([place])   ? Phreesia 11/28/2020  ? Hyperlipidemia 12/01/2020  ? Hypertension   ? Pneumothorax 1977  ? PONV (postoperative nausea and vomiting)   ? ?Past Surgical History:  ?Procedure Laterality Date  ? BREAST BIOPSY Left   ? approx @ 48 benign   ? collapsed lung on right     ? 1970s  ? HERNIA REPAIR    ? age 75  ? HIP SURGERY  2007  ? right  ? JOINT REPLACEMENT  2007  ? TOTAL HIP ARTHROPLASTY Left 07/25/2020  ? Procedure: LEFT TOTAL HIP ARTHROPLASTY ANTERIOR APPROACH;  Surgeon: Melrose Nakayama, MD;  [facility]: [facility];  Service: Orthopedics;  Laterality: Left;  ? TOTAL KNEE ARTHROPLASTY Left 12/18/2021  ? Procedure: LEFT TOTAL KNEE ARTHROPLASTY;  Surgeon: Melrose Nakayama, MD;  Location: [facility];  Service: Orthopedics;  Laterality: Left;  ? ?Patient Active Problem List  ? Diagnosis Date Noted  ? Stage 3a chronic kidney disease ([place]) 10/05/2021  ? Muscle spasm 02/06/2021  ? Insomnia, unspecified 12/01/2020  ? Hyperlipidemia 12/01/2020  ? Chronic diastolic heart failure ([place]) 12/01/2020  ? Leg edema 12/01/2020  ? Primary localized osteoarthritis of left hip 07/25/2020  ? Tubular adenoma of colon 12/11/2016  ? Dyspnea 12/11/2016  ? Vitamin D deficiency 11/21/2016  ? Pre-diabetes 11/21/2016  ? Essential hypertension 11/19/2016  ? Depression, recurrent ([place]) 11/19/2016  ? Osteoarthritis 11/19/2016  ? Chronic back pain greater than 3 months duration 11/19/2016  ? History of cervical dysplasia 11/19/2016  ? Morbid obesity ([place]) 11/19/2016  ? ? ?ONSET DATE: S/P L TOTAL KNEE REPL PER Melrose Nakayama, MD ?DOS ON 12/18/21  ? ?REFERRING DIAG: PCP: Tisovec, Fransico Him, MD ?  ?REFERRING PROVIDER: Melrose Nakayama, MD ?  ?REFERRING DIAG: PT EVAL/TX S/P L TOTAL KNEE REPL PER PETER DALLDORF, MD ?DOS ON 12/18/21 PLEASE [facility] 12/21/21 ? ?THERAPY DIAG:  ?Acute pain of left knee ? ?Stiffness of left knee, not elsewhere classified ? ?Other abnormalities of gait and mobility ? ?Localized edema ? ?Difficulty in walking, not elsewhere classified ? ?Pain in left hip ? ? ?PERTINENT HISTORY: Right Hip THA 2007, Left hip THA October 2021, still has some soreness L hip  ? ?PRECAUTIONS: None,   fall risk  ? ?SUBJECTIVE: Patient is having a tough morning, very nauseous and not feeling well overall. Did HEP but is struggling this am with her knee very painful and stiff. ? ?PAIN:  ?Are you having pain? Yes: NPRS scale: 7/10 ?Pain location: left knee ?Pain description: burning, throbbing ?Aggravating factors: prolonged positioning ?Relieving factors: gentle movement ? ? ? ?OBJECTIVE:  ?  ?PATIENT SURVEYS:  ?FOTO 21 at eval ?  ?COGNITION: ?         Overall cognitive status: Within functional limits for tasks assessed              ?          ?SENSATION: ?         Light touch: Appears intact ?                   Proprioception: Appears intact ?  ?  PALPATION: ?Tenderness at and around knee joint ?  ?LE AROM/PROM: ?  ?A/PROM Right ?12/20/2021 Left ?12/21/21 Left ?12/24/21  ?Hip flexion       ?Hip extension       ?Hip abduction       ?Hip adduction       ?Hip internal rotation       ?Hip external rotation       ?Knee flexion   62 50  ?Knee extension   -15 -5  ?Ankle dorsiflexion       ?Ankle plantarflexion       ?Ankle inversion       ?Ankle eversion       ? (Blank rows = not tested) ?  ?LE MMT: ?  ?MMT Right ?12/20/2021 Left ?12/20/2021  ?Hip flexion      ?Hip extension      ?Hip abduction      ?Hip adduction      ?Hip internal rotation      ?Hip external rotation      ?Knee flexion      ?Knee extension   Poor quad set  ?Ankle dorsiflexion      ?Ankle plantarflexion      ?Ankle inversion      ?Ankle eversion      ?  (Blank rows = not tested) ?  ?  ?FUNCTIONAL TESTS:  ?30 seconds chair stand test x 2   at eval 3/10 ?  ?GAIT: ?Distance walked: 75 ?Assistive device utilized: Environmental consultant - 2 wheeled ?Level of assistance: SBA ?Comments: slow antalgic gait noted; decreased stance left lower extremity, decreased heel strike, toe off ?  ?  ?  ?TODAY'S TREATMENT: ?12/21/21 = PT evaluation, HEP instruction ? 12/24/21:   ?Manual = Supine posterior L knee edema massage, pre jt circ. L 55.cm verse R 50 cm;  trial of grade 1 joint mobilization PA rocking  ? Supine positioning for prolonged 10 min extension hold during manual with towel roll under heel ? Elevated SLR ? Therapeutic Exercises =  ?  Seated = heel slides 2 x10 reps ?   Heel/toe raise 2 x 10 reps ?   Glut set 2 x 10 reps ?  Supine = quad set 2 x 10 reps ?   Heel slides AAROM 2 x 10 reps ?   Elevated with DPT holding SLR - ankle pumps 2 x 10  ? ?12/24/21: Discussion and Education for continued post operative status and HEP consistent use including increased to 2 x per day and more often heel toe movement. Continue use of ice post therapy  ?  ?PATIENT EDUCATION:  ?Education details: HEP, gait training, ice for pain management, walking hourly ?Person educated: Patient ?Education method: Explanation, Demonstration, and Handouts ?Education comprehension: verbalized understanding, returned demonstration, and needs further education ?  ?  ?HOME EXERCISE PROGRAM: ?Access Code: [email or web address] ?URL: [email or web address] ?Date: 12/21/2021 ?Prepared by: [facility] ?  ?Exercises ?Seated Heel Slide - 1 x daily - 7 x weekly - 1 sets - 10 reps ?Supine Quad Set - 1 x daily - 7 x weekly - 1 sets - 10 reps ?Supine Gluteal Sets - 1 x daily - 7 x weekly - 1 sets - 10 reps ?Supine Ankle Pumps - 1 x daily - 7 x weekly - 2 sets - 10 reps ?Supine Heel Slide with Strap - 1 x daily - 7 x weekly - 1 sets - 10 reps ?  ?  ?ASSESSMENT: ?  ?CLINICAL IMPRESSION: ?12/24/21 =  A:  Patient attending first full  treatment session after evaluation with good review of HEP. Today?s session continued to build on prior evaluation with primary focus on R knee ROM secondary to continued pain, stiffness and edema. Patient demonstrated poor ROM at initiation of session with increased statement of feeling stiff today with cold weather and not feeling well, however showed good form on HEP and was able to tolerate session well.  Needs continued work on relaxing through passive actions to allow movement.  Patient is a good candidate to continue with skilled physical therapy to progress current needs of post operative recovery while improving functional status as able.  ?  ?  ?OBJECTIVE IMPAIRMENTS Abnormal gait, decreased activity tolerance, decreased balance, decreased endurance, decreased mobility, difficulty walking, decreased ROM, decreased strength, decreased safety awareness, increased edema, impaired perceived functional ability, increased muscle spasms, impaired flexibility, and pain.  ?  ?ACTIVITY LIMITATIONS cleaning, community activity, driving, meal prep, laundry, yard work, shopping, and yard work.  ?  ?PERSONAL FACTORS Fitness are also affecting patient's functional outcome.  ?  ?  ?REHAB POTENTIAL: Good ?  ?CLINICAL DECISION MAKING: Stable/uncomplicated ?  ?EVALUATION COMPLEXITY: Low ?  ?  ?GOALS: ?Goals reviewed with patient? Yes ?  ?SHORT TERM GOALS: ?  ?Patient will be independent in initial HEP to improve functional outcomes. ?  ?Target date: 01/04/2022 ?Goal status: INITIAL ?  ?2.  Patient will improve Left  knee AAROM flexion in sitting to 90 degrees to improve functional mobility in home ?Baseline: 62 ?Target date: 01/04/2022 ?Goal status: INITIAL ?  ?LONG TERM GOALS: ?  ?Long term goal: Patient will be independent with advanced HEP and self management strategies to improve quality of life and functional outcomes. ?  ?Target date: 01/18/2022 ?Goal status: INITIAL ?  ?2.  Patient will meet predicted FOTO score to  demonstrate improved overall function. ?  ?Baseline: 21 ?Target date: 01/18/2022 ?Goal status: INITIAL ?  ?3.  Patient will report at least 50% improvement in overall symptoms and/or function to demonstrate improve

## 2021-12-27 ENCOUNTER — Ambulatory Visit (HOSPITAL_COMMUNITY): Payer: Medicare Other

## 2021-12-27 ENCOUNTER — Encounter (HOSPITAL_COMMUNITY): Payer: Self-pay

## 2021-12-27 DIAGNOSIS — M25562 Pain in left knee: Secondary | ICD-10-CM | POA: Diagnosis not present

## 2021-12-27 NOTE — Therapy (Signed)
?OUTPATIENT PHYSICAL THERAPY TREATMENT NOTE ? ? ?Patient Name: Sarah Cooper ?MRN: [id number] ?DOB:04-27-1951, 71 y.o., female ?Today's Date: 12/27/2021 ? ?PCP: Haywood Pao, MD ?REFERRING PROVIDER: Melrose Nakayama, MD ? ? PT End of Session - 12/27/21 0827   ? ? Visit Number 4   ? Number of Visits 12   ? Date for PT Re-Evaluation 01/18/22   ? Authorization Type [employer] Medicare; copay 20.00   ? Authorization Time Period 10/14/21 to 10/13/22; no auth required follow MCR guidelines   ? Authorization - Visit Number 4   ? Progress Note Due on Visit 12   ? PT Start Time [phone]   ? PT Stop Time 0900   ? PT Time Calculation (min) 36 min   ? Activity Tolerance Patient tolerated treatment well   ? Behavior During Therapy [facility] for tasks assessed/performed   ? ?  ?  ? ?  ? ? ?Past Medical History:  ?Diagnosis Date  ? Arthritis   ? back, hips, knees  ? Cancer [facility]) 1978  ? cervical  ? CHF (congestive heart failure) ([place])   ? Phreesia 11/28/2020  ? Hyperlipidemia 12/01/2020  ? Hypertension   ? Pneumothorax 1977  ? PONV (postoperative nausea and vomiting)   ? ?Past Surgical History:  ?Procedure Laterality Date  ? BREAST BIOPSY Left   ? approx @ 48 benign   ? collapsed lung on right     ? 1970s  ? HERNIA REPAIR    ? age 8  ? HIP SURGERY  2007  ? right  ? JOINT REPLACEMENT  2007  ? TOTAL HIP ARTHROPLASTY Left 07/25/2020  ? Procedure: LEFT TOTAL HIP ARTHROPLASTY ANTERIOR APPROACH;  Surgeon: Melrose Nakayama, MD;  [facility];  Service: Orthopedics;  Laterality: Left;  ? TOTAL KNEE ARTHROPLASTY Left 12/18/2021  ? Procedure: LEFT TOTAL KNEE ARTHROPLASTY;  Surgeon: Melrose Nakayama, MD;  [facility];  Service: Orthopedics;  Laterality: Left;  ? ?Patient Active Problem List  ? Diagnosis Date Noted  ? Stage 3a chronic kidney disease ([place]) 10/05/2021  ? Muscle spasm 02/06/2021  ? Insomnia, unspecified 12/01/2020  ? Hyperlipidemia 12/01/2020  ? Chronic diastolic heart failure ([place]) 12/01/2020  ? Leg edema 12/01/2020  ? Primary  localized osteoarthritis of left hip 07/25/2020  ? Tubular adenoma of colon 12/11/2016  ? Dyspnea 12/11/2016  ? Vitamin D deficiency 11/21/2016  ? Pre-diabetes 11/21/2016  ? Essential hypertension 11/19/2016  ? Depression, recurrent ([place]) 11/19/2016  ? Osteoarthritis 11/19/2016  ? Chronic back pain greater than 3 months duration 11/19/2016  ? History of cervical dysplasia 11/19/2016  ? Morbid obesity ([place]) 11/19/2016  ? ? ?Patient Name: Sarah Cooper ?MRN: [id number] ?DOB:May 28, 1951, 71 y.o., female ?Today's Date: 12/21/21 ?  ?  ?  ?    ?Past Medical History:  ?Diagnosis Date  ? Arthritis    ?  back, hips, knees  ? Cancer [facility]) 1978  ?  cervical  ? CHF (congestive heart failure) ([place])    ?  Phreesia 11/28/2020  ? Hyperlipidemia 12/01/2020  ? Hypertension    ? Pneumothorax 1977  ? PONV (postoperative nausea and vomiting)    ?  ?     ?Past Surgical History:  ?Procedure Laterality Date  ? BREAST BIOPSY Left    ?  approx @ 48 benign   ? collapsed lung on right       ?  1970s  ? HERNIA REPAIR      ?  age 46  ? HIP SURGERY  2007  ?  right  ? JOINT REPLACEMENT   2007  ? TOTAL HIP ARTHROPLASTY Left 07/25/2020  ?  Procedure: LEFT TOTAL HIP ARTHROPLASTY ANTERIOR APPROACH;  Surgeon: Melrose Nakayama, MD;  [facility];  Service: Orthopedics;  Laterality: Left;  ? TOTAL KNEE ARTHROPLASTY Left 12/18/2021  ?  Procedure: LEFT TOTAL KNEE ARTHROPLASTY;  Surgeon: Melrose Nakayama, MD;  [facility];  Service: Orthopedics;  Laterality: Left;  ?  ?    ?Patient Active Problem List  ?  Diagnosis Date Noted  ? Stage 3a chronic kidney disease ([place]) 10/05/2021  ? Muscle spasm 02/06/2021  ? Insomnia, unspecified 12/01/2020  ? Hyperlipidemia 12/01/2020  ? Chronic diastolic heart failure ([place]) 12/01/2020  ? Leg edema 12/01/2020  ? Primary localized osteoarthritis of left hip 07/25/2020  ? Tubular adenoma of colon 12/11/2016  ? Dyspnea 12/11/2016  ? Vitamin D deficiency 11/21/2016  ? Pre-diabetes 11/21/2016  ? Essential hypertension  11/19/2016  ? Depression, recurrent ([place]) 11/19/2016  ? Osteoarthritis 11/19/2016  ? Chronic back pain greater than 3 months duration 11/19/2016  ? History of cervical dysplasia 11/19/2016  ? Morbid obesity ([place]) 11/19/2016  ? ?SUBJECTIVE: Patient reports continued pain and soreness Left knee.  She states compliance with HEP.  She reports possible less swelling today and is ready for doctor visit tomorrow.  ? ?PAIN:  ?Are you having pain? Yes: NPRS scale: 7/10 ?Pain location: Left knee ?Pain description: sore, tender, stiff ?Aggravating factors: movement ?Relieving factors: ice, medication ?  ?  ?PCP: Tisovec, Fransico Him, MD ?  ?REFERRING PROVIDER: Melrose Nakayama, MD ?  ?REFERRING DIAG: PT EVAL/TX S/P L TOTAL KNEE REPL PER PETER DALLDORF, MD ?DOS ON 12/18/21 PLEASE [facility] 12/21/21 ?  ?THERAPY DIAG:  ?OA L knee ?  ?ONSET DATE: s/p 12/18/21 ?  ?  ?PERTINENT HISTORY: ?Right Hip THA 2007, Left hip THA October 2021, still has some soreness L hip ?  ?PRECAUTIONS: Fall ?  ?WEIGHT BEARING RESTRICTIONS No ?  ?FALLS:  ?Has patient fallen in last 6 months? No, Number of falls: 0 ?  ?LIVING ENVIRONMENT: ?Lives with: lives alone ?Lives in: House/apartment ?Stairs: No;  ?Has following equipment at home: Single point cane, Walker - 2 wheeled, and Shower bench ?  ?OCCUPATION: retired ?  ?PLOF: Independent ?  ?PATIENT GOALS : be independent, water aeorobics ?  ?  ?OBJECTIVE:  ?  ?PATIENT SURVEYS:  ?FOTO 21 ?  ?COGNITION: ?         Overall cognitive status: Within functional limits for tasks assessed              ?          ?SENSATION: ?         Light touch: Appears intact ?                   Proprioception: Appears intact ?  ?PALPATION: ?Tenderness at and around knee joint ?  ?LE AROM/PROM: ?  ?A/PROM Right ?12/20/2021 Left ?12/21/21 Left ?12/26/21  ?Hip flexion       ?Hip extension       ?Hip abduction       ?Hip adduction       ?Hip internal rotation       ?Hip external rotation       ?Knee flexion   62 64 AA in sitting  ?Knee extension    -15   ?Ankle dorsiflexion       ?Ankle plantarflexion       ?  Ankle inversion       ?Ankle eversion       ? (Blank rows = not tested) ?  ?LE MMT: ?  ?MMT Right ?12/20/2021 Left ?12/20/2021  ?Hip flexion      ?Hip extension      ?Hip abduction      ?Hip adduction      ?Hip internal rotation      ?Hip external rotation      ?Knee flexion      ?Knee extension   Poor quad set  ?Ankle dorsiflexion      ?Ankle plantarflexion      ?Ankle inversion      ?Ankle eversion      ? (Blank rows = not tested) ?  ?  ?FUNCTIONAL TESTS:  ?30 seconds chair stand test x 2 ?  ?GAIT: ?Distance walked: 75 ?Assistive device utilized: Environmental consultant - 2 wheeled ?Level of assistance: SBA ?Comments: slow antalgic gait noted; decreased stance left lower extremity, decreased heel strike, toe off ?  ?  ?  ?TODAY'S TREATMENT: ?12/27/21 =  ?Nustep seat 9, arms 12 x 5 min then Nustep seat 8, arms 12 x 5 min ?Seated heel/toe raises x 10 ?Seated marching with cue for knee relaxation x 10 ?Seated heel slides x 10, with overpressure x 5 ?Seated LAQs with assistance x 5 ?Supine quad sets x 10 ?Supine glute sets x 10 ? Supine knee ext hang x 1 min ?Supine hip ABD with assistance x 10 ?Supine heel slide with strap and assistance x 5 ?Sit to stand x 2 with cue for terminal knee extension and tall posture ?AROM left knee at end of session = 0 extension to 85 degrees flexion  ? ?12/26/21 =  ?Seated heel slides x 10, with overpressure x 5 ?Seated heel/toe raises x 10 ?Seated LAQs with assistance x 5 ?Supine quad sets x 10 ?Supine glute sets x 10 ? Supine knee ext hang x 1 min ?Supine hip ABD with assistance x 10 ?Supine heel slide with strap and assistance x 5 ?Sitting AAROM KF on elevated EOB x 1 min ?Nustep seat 10, arms 12 x 5 min ? ?  ?  ?PATIENT EDUCATION:  ?Education details: continued with education of blood clot safety, HEP, gait training, ice for pain management, walking hourly ?Person educated: Patient ?Education method: Explanation, Demonstration, and  Handouts ?Education comprehension: verbalized understanding, returned demonstration, and needs further education ?  ?  ?HOME EXERCISE PROGRAM: ?Access Code: [email or web address] ?URL: [email or web address] ?Date: 03/

## 2021-12-28 ENCOUNTER — Encounter (HOSPITAL_COMMUNITY): Payer: Medicare Other

## 2021-12-31 ENCOUNTER — Ambulatory Visit (HOSPITAL_COMMUNITY): Payer: Medicare Other

## 2021-12-31 ENCOUNTER — Encounter (HOSPITAL_COMMUNITY): Payer: Self-pay

## 2021-12-31 ENCOUNTER — Other Ambulatory Visit: Payer: Self-pay

## 2021-12-31 DIAGNOSIS — M25562 Pain in left knee: Secondary | ICD-10-CM

## 2021-12-31 DIAGNOSIS — R2689 Other abnormalities of gait and mobility: Secondary | ICD-10-CM

## 2021-12-31 DIAGNOSIS — R262 Difficulty in walking, not elsewhere classified: Secondary | ICD-10-CM

## 2021-12-31 DIAGNOSIS — M25662 Stiffness of left knee, not elsewhere classified: Secondary | ICD-10-CM

## 2021-12-31 DIAGNOSIS — R600 Localized edema: Secondary | ICD-10-CM

## 2021-12-31 NOTE — Therapy (Signed)
?OUTPATIENT PHYSICAL THERAPY TREATMENT NOTE ? ? ?Patient Name: Sarah Cooper ?MRN: [id number] ?DOB:Feb 25, 1951, 71 y.o., female ?Today's Date: 12/31/2021 ? ?PCP: Tisovec, Fransico Him, MD ?REFERRING PROVIDER: Melrose Nakayama, MD ? ? ? ? ?Past Medical History:  ?Diagnosis Date  ? Arthritis   ? back, hips, knees  ? Cancer [facility]) 1978  ? cervical  ? CHF (congestive heart failure) ([place])   ? Phreesia 11/28/2020  ? Hyperlipidemia 12/01/2020  ? Hypertension   ? Pneumothorax 1977  ? PONV (postoperative nausea and vomiting)   ? ?Past Surgical History:  ?Procedure Laterality Date  ? BREAST BIOPSY Left   ? approx @ 48 benign   ? collapsed lung on right     ? 1970s  ? HERNIA REPAIR    ? age 19  ? HIP SURGERY  2007  ? right  ? JOINT REPLACEMENT  2007  ? TOTAL HIP ARTHROPLASTY Left 07/25/2020  ? Procedure: LEFT TOTAL HIP ARTHROPLASTY ANTERIOR APPROACH;  Surgeon: Melrose Nakayama, MD;  Location: [facility];  Service: Orthopedics;  Laterality: Left;  ? TOTAL KNEE ARTHROPLASTY Left 12/18/2021  ? Procedure: LEFT TOTAL KNEE ARTHROPLASTY;  Surgeon: Melrose Nakayama, MD;  Location: [facility];  Service: Orthopedics;  Laterality: Left;  ? ?Patient Active Problem List  ? Diagnosis Date Noted  ? Stage 3a chronic kidney disease ([place]) 10/05/2021  ? Muscle spasm 02/06/2021  ? Insomnia, unspecified 12/01/2020  ? Hyperlipidemia 12/01/2020  ? Chronic diastolic heart failure ([place]) 12/01/2020  ? Leg edema 12/01/2020  ? Primary localized osteoarthritis of left hip 07/25/2020  ? Tubular adenoma of colon 12/11/2016  ? Dyspnea 12/11/2016  ? Vitamin D deficiency 11/21/2016  ? Pre-diabetes 11/21/2016  ? Essential hypertension 11/19/2016  ? Depression, recurrent ([place]) 11/19/2016  ? Osteoarthritis 11/19/2016  ? Chronic back pain greater than 3 months duration 11/19/2016  ? History of cervical dysplasia 11/19/2016  ? Morbid obesity ([place]) 11/19/2016  ? ? ?Patient Name: Sarah Cooper ?MRN: [id number] ?DOB:[birth year], 71 y.o., female ?Today's Date: 12/21/21 ?  ?  ?   ?    ?Past Medical History:  ?Diagnosis Date  ? Arthritis    ?  back, hips, knees  ? Cancer [facility]) 1978  ?  cervical  ? CHF (congestive heart failure) ([place])    ?  Phreesia 11/28/2020  ? Hyperlipidemia 12/01/2020  ? Hypertension    ? Pneumothorax 1977  ? PONV (postoperative nausea and vomiting)    ?  ?     ?Past Surgical History:  ?Procedure Laterality Date  ? BREAST BIOPSY Left    ?  approx @ 48 benign   ? collapsed lung on right       ?  1970s  ? HERNIA REPAIR      ?  age 45  ? HIP SURGERY   2007  ?  right  ? JOINT REPLACEMENT   2007  ? TOTAL HIP ARTHROPLASTY Left 07/25/2020  ?  Procedure: LEFT TOTAL HIP ARTHROPLASTY ANTERIOR APPROACH;  Surgeon: Melrose Nakayama, MD;  [facility];  Service: Orthopedics;  Laterality: Left;  ? TOTAL KNEE ARTHROPLASTY Left 12/18/2021  ?  Procedure: LEFT TOTAL KNEE ARTHROPLASTY;  Surgeon: Melrose Nakayama, MD;  [facility]: [facility];  Service: Orthopedics;  Laterality: Left;  ?  ?    ?Patient Active Problem List  ?  Diagnosis Date Noted  ? Stage 3a chronic kidney disease ([place]) 10/05/2021  ? Muscle spasm 02/06/2021  ? Insomnia, unspecified 12/01/2020  ? Hyperlipidemia 12/01/2020  ? Chronic diastolic heart failure (  [place]) 12/01/2020  ? Leg edema 12/01/2020  ? Primary localized osteoarthritis of left hip 07/25/2020  ? Tubular adenoma of colon 12/11/2016  ? Dyspnea 12/11/2016  ? Vitamin D deficiency 11/21/2016  ? Pre-diabetes 11/21/2016  ? Essential hypertension 11/19/2016  ? Depression, recurrent ([place]) 11/19/2016  ? Osteoarthritis 11/19/2016  ? Chronic back pain greater than 3 months duration 11/19/2016  ? History of cervical dysplasia 11/19/2016  ? Morbid obesity ([place]) 11/19/2016  ? ?SUBJECTIVE: Patient reports she has some left hip anterior pain this am along with continued knee pain with some anterior clicking and worrying about buckling when walking a bit. She had MD appointment on Friday and stated "doctor said ahead of schedule and will go back in 3 weeks" ? ?PAIN:  ?Are you having pain?  Yes: NPRS scale: 4/10 ?Pain location: Left knee ?Pain description: sore, tender, stiff ?Aggravating factors: movement ?Relieving factors: ice, medication ?  ?  ?PCP: Tisovec, Fransico Him, MD ?  ?REFERRING PROVIDER: Melrose Nakayama, MD ?  ?REFERRING DIAG: PT EVAL/TX S/P L TOTAL KNEE REPL PER PETER DALLDORF, MD ?DOS ON 12/18/21 PLEASE [facility] 12/21/21 ?  ?THERAPY DIAG:  ?OA L knee ?  ?ONSET DATE: s/p 12/18/21 ?  ?  ?PERTINENT HISTORY: ?Right Hip THA 2007, Left hip THA October 2021, still has some soreness L hip ?  ?PRECAUTIONS: Fall ?  ?WEIGHT BEARING RESTRICTIONS No ?  ?FALLS:  ?Has patient fallen in last 6 months? No, Number of falls: 0 ?  ?LIVING ENVIRONMENT: ?Lives with: lives alone ?Lives in: House/apartment ?Stairs: No;  ?Has following equipment at home: Single point cane, Walker - 2 wheeled, and Shower bench ?  ?OCCUPATION: retired ?  ?PLOF: Independent ?  ?PATIENT GOALS : be independent, water aeorobics ?  ?  ?OBJECTIVE:  ?  ?PATIENT SURVEYS:  ?FOTO 21 ?  ?COGNITION: ?         Overall cognitive status: Within functional limits for tasks assessed              ?          ?SENSATION: ?         Light touch: Appears intact ?                   Proprioception: Appears intact ?  ?PALPATION: ?Tenderness at and around knee joint ?  ?LE AROM/PROM: ?  ?A/PROM Right ?12/20/2021 Left ?12/21/21 Left ?12/26/21  ?Hip flexion       ?Hip extension       ?Hip abduction       ?Hip adduction       ?Hip internal rotation       ?Hip external rotation       ?Knee flexion   62 64 AA in sitting  ?Knee extension   -15   ?Ankle dorsiflexion       ?Ankle plantarflexion       ?Ankle inversion       ?Ankle eversion       ? (Blank rows = not tested) ?  ?LE MMT: ?  ?MMT Right ?12/20/2021 Left ?12/20/2021  ?Hip flexion      ?Hip extension      ?Hip abduction      ?Hip adduction      ?Hip internal rotation      ?Hip external rotation      ?Knee flexion      ?Knee extension   Poor quad set  ?Ankle dorsiflexion      ?Ankle plantarflexion      ?  Ankle inversion       ?Ankle eversion      ? (Blank rows = not tested) ?  ?  ?FUNCTIONAL TESTS:  ?30 seconds chair stand test x 2 ?  ?GAIT: ?Distance walked: 75 ?Assistive device utilized: Environmental consultant - 2 wheeled ?Level of assistance: SBA ?Comments: slow antalgic gait noted; decreased stance left lower extremity, decreased heel strike, toe off ?  ?  ?  ?TODAY'S TREATMENT: ?12/31/21 =  ?Nustep seat 8, arms 12 x 8 min  ?Supine quad sets x 10 ?Supine glute sets x 10 ?Supine knee ext hang x 1 min mod thomas position ?Supine heel slide with strap and assistance x 5Seated heel/toe raises x 10 ?Seated hip abduction verse green band trial with pain ?Seated hip adduction verse blue ball trial x 10  ?Seated heel slides x 10, with overpressure x 5 ?Seated LAQs with assistance x 5 ?Sit to stand x 2 with cue for terminal knee extension and tall posture ?AROM left knee at end of session = 0 extension to 90 degrees flexion  ? ?12/27/21 =  ?Nustep seat 9, arms 12 x 5 min then Nustep seat 8, arms 12 x 5 min ?Seated heel/toe raises x 10 ?Seated marching with cue for knee relaxation x 10 ?Seated heel slides x 10, with overpressure x 5 ?Seated LAQs with assistance x 5 ?Supine quad sets x 10 ?Supine glute sets x 10 ? Supine knee ext hang x 1 min ?Supine hip ABD with assistance x 10 ?Supine heel slide with strap and assistance x 5 ?Sit to stand x 2 with cue for terminal knee extension and tall posture ?AROM left knee at end of session = 0 extension to 85 degrees flexion  ? ?12/26/21 =  ?Seated heel slides x 10, with overpressure x 5 ?Seated heel/toe raises x 10 ?Seated LAQs with assistance x 5 ?Supine quad sets x 10 ?Supine glute sets x 10 ? Supine knee ext hang x 1 min ?Supine hip ABD with assistance x 10 ?Supine heel slide with strap and assistance x 5 ?Sitting AAROM KF on elevated EOB x 1 min ?Nustep seat 10, arms 12 x 5 min ? ?  ?  ?PATIENT EDUCATION:  ?Education details: Discussion on buckling concept with need for quad activation and TKE, HEP, gait training,  ice for pain management, walking hourly and increased elevation of leg above heart and trial of gentle heat on upper quads not over knee ?Person educated: Patient ?Education method: Explanation, Higher education careers adviser

## 2022-01-02 ENCOUNTER — Telehealth (HOSPITAL_COMMUNITY): Payer: Self-pay | Admitting: Physical Therapy

## 2022-01-02 ENCOUNTER — Other Ambulatory Visit: Payer: Self-pay

## 2022-01-02 ENCOUNTER — Ambulatory Visit (HOSPITAL_COMMUNITY): Payer: Medicare Other | Admitting: Physical Therapy

## 2022-01-02 DIAGNOSIS — M25562 Pain in left knee: Secondary | ICD-10-CM

## 2022-01-02 DIAGNOSIS — R600 Localized edema: Secondary | ICD-10-CM

## 2022-01-02 DIAGNOSIS — R2689 Other abnormalities of gait and mobility: Secondary | ICD-10-CM

## 2022-01-02 DIAGNOSIS — R262 Difficulty in walking, not elsewhere classified: Secondary | ICD-10-CM

## 2022-01-02 DIAGNOSIS — M25662 Stiffness of left knee, not elsewhere classified: Secondary | ICD-10-CM

## 2022-01-02 DIAGNOSIS — M25552 Pain in left hip: Secondary | ICD-10-CM

## 2022-01-02 NOTE — Therapy (Signed)
?OUTPATIENT PHYSICAL THERAPY TREATMENT NOTE ? ? ?Patient Name: Sarah Cooper ?MRN: [id number] ?DOB:11-15-1950, 70 y.o., female ?Today's Date: 01/02/2022 ? ?PCP: Haywood Pao, MD ?REFERRING PROVIDER: Melrose Nakayama, MD ? ? PT End of Session - 01/02/22 7322   ? ? Visit Number 6   ? Number of Visits 12   ? Date for PT Re-Evaluation 01/18/22   ? Authorization Type [employer] Medicare; copay 20.00   ? Authorization Time Period 10/14/21 to 10/13/22; no auth required follow MCR guidelines   ? Authorization - Visit Number 6   ? Progress Note Due on Visit 12   ? PT Start Time 0254   ? PT Stop Time 942  ? PT Time Calculation (min) 61mn   ? Activity Tolerance Patient tolerated treatment well   ? Behavior [facility] assessed/performed   ? ?  ?  ? ?  ? ? ? ?Past Medical History:  ?Diagnosis Date  ? Arthritis   ? back, hips, knees  ? Cancer ([facility]  ? cervical  ? [place]) [place]   ? Phreesia 11/28/2020  ? Hyperlipidemia 12/01/2020  ? Hypertension   ? Pneumothorax 1977  ? PONV (postoperative nausea and vomiting)   ? ?Past Surgical History:  ?Procedure Laterality Date  ? BREAST BIOPSY Left   ? approx @ 48 benign   ? collapsed lung on right     ? 1970s  ? HERNIA REPAIR    ? age 71 ? HIP SURGERY  2007  ? right  ? JOINT REPLACEMENT  2007  ? TOTAL HIP ARTHROPLASTY Left 07/25/2020  ? Procedure: LEFT TOTAL HIP ARTHROPLASTY ANTERIOR APPROACH;  Surgeon: DMelrose Nakayama MD;  [facility];  Service: Orthopedics;  Laterality: Left;  ? TOTAL KNEE ARTHROPLASTY Left 12/18/2021  ? Procedure: LEFT TOTAL KNEE ARTHROPLASTY;  Surgeon: DMelrose Nakayama MD;  [facility];  Service: Orthopedics;  Laterality: Left;  ? ?Patient Active Problem List  ? Diagnosis Date Noted  ? Stage 61a chronic kidney disease ([place] 10/05/2021  ? Muscle spasm 02/06/2021  ? Insomnia, unspecified 12/01/2020  ? Hyperlipidemia 12/01/2020  ? Chronic diastolic heart failure ([place] 12/01/2020  ? Leg edema 12/01/2020  ? Primary  localized osteoarthritis of left hip 07/25/2020  ? Tubular adenoma of colon 12/11/2016  ? Dyspnea 12/11/2016  ? Vitamin D deficiency 11/21/2016  ? Pre-diabetes 11/21/2016  ? Essential hypertension 11/19/2016  ? Depression, recurrent ([place] 11/19/2016  ? Osteoarthritis 11/19/2016  ? Chronic back pain greater than 3 months duration 11/19/2016  ? History of cervical dysplasia 11/19/2016  ? Morbid obesity ([place] 11/19/2016  ? ? ?Patient Name: Sarah Cooper?MRN: [id number]?DOB:125-Jul-1952 71y.o., female ?Today's Date: 12/21/21 ?  ?  ?  ?    ?Past Medical History:  ?Diagnosis Date  ? Arthritis    ?  back, hips, knees  ? Cancer ([facility] 1978  ?  cervical  ? CH[place]congestive heart [place]) [place]    ?  Phreesia 11/28/2020  ? Hyperlipidemia 12/01/2020  ? Hypertension    ? Pneumothorax 1977  ? PONV (postoperative nausea and vomiting)    ?  ?     ?Past Surgical History:  ?Procedure Laterality Date  ? BREAST BIOPSY Left    ?  approx @ 48 benign   ? collapsed lung on right       ?  1970s  ? HERNIA REPAIR      ?  age 71 ? HIP SURGERY  2007  ?  right  ? JOINT REPLACEMENT   2007  ? TOTAL HIP ARTHROPLASTY Left 07/25/2020  ?  Procedure: LEFT TOTAL HIP ARTHROPLASTY ANTERIOR APPROACH;  Surgeon: Melrose Nakayama, MD;  [facility];  Service: Orthopedics;  Laterality: Left;  ? TOTAL KNEE ARTHROPLASTY Left 12/18/2021  ?  Procedure: LEFT TOTAL KNEE ARTHROPLASTY;  Surgeon: Melrose Nakayama, MD;  [facility];  Service: Orthopedics;  Laterality: Left;  ?  ?    ?Patient Active Problem List  ?  Diagnosis Date Noted  ? Stage 3a chronic kidney disease ([place]) 10/05/2021  ? Muscle spasm 02/06/2021  ? Insomnia, unspecified 12/01/2020  ? Hyperlipidemia 12/01/2020  ? Chronic diastolic heart failure ([place]) 12/01/2020  ? Leg edema 12/01/2020  ? Primary localized osteoarthritis of left hip 07/25/2020  ? Tubular adenoma of colon 12/11/2016  ? Dyspnea 12/11/2016  ? Vitamin D deficiency 11/21/2016  ? Pre-diabetes 11/21/2016  ? Essential hypertension  11/19/2016  ? Depression, recurrent ([place]) 11/19/2016  ? Osteoarthritis 11/19/2016  ? Chronic back pain greater than 3 months duration 11/19/2016  ? History of cervical dysplasia 11/19/2016  ? Morbid obesity ([place]) 11/19/2016  ? ?SUBJECTIVE: PT 18 minutes late for appointment.  Pt states that she woke up on the wrong side of the bed today. ?PAIN:  ?Are you having pain? Yes: NPRS scale: 4/10 ?Pain location: Left knee ?Pain description: sore, tender, stiff ?Aggravating factors: movement ?Relieving factors: ice, medication ?  ?  ?PCP: Tisovec, Fransico Him, MD ?  ?REFERRING PROVIDER: Melrose Nakayama, MD ?  ?REFERRING DIAG: PT EVAL/TX S/P L TOTAL KNEE REPL PER PETER DALLDORF, MD ?DOS ON 12/18/21  ?  ?THERAPY DIAG:  ?OA L knee ?  ?  ?OBJECTIVE:  ?                        01/02/2022: ?                         Standing:  Heel raises x 10 ?                                          Knee flexion x 10 ?                         Sitting :     LAQ with pulling knee backward x 5 ?                         Supine:    Quad set x 5  ?  ?TODAY'S TREATMENT: ?12/31/21 =  ?Nustep seat 8, arms 12 x 8 min  ?Supine quad sets x 10 ?Supine glute sets x 10 ?Supine knee ext hang x 1 min mod thomas position ?Supine heel slide with strap and assistance x 5Seated heel/toe raises x 10 ?Seated hip abduction verse green band trial with pain ?Seated hip adduction verse blue ball trial x 10  ?Seated heel slides x 10, with overpressure x 5 ?Seated LAQs with assistance x 5 ?Sit to stand x 2 with cue for terminal knee extension and tall posture ?AROM left knee at end of session = 0 extension to 90 degrees flexion  ? ?12/27/21 =  ?Nustep seat 9, arms 12 x 5 min then Nustep seat 8, arms 12  x 5 min ?Seated heel/toe raises x 10 ?Seated marching with cue for knee relaxation x 10 ?Seated heel slides x 10, with overpressure x 5 ?Seated LAQs with assistance x 5 ?Supine quad sets x 10 ?Supine glute sets x 10 ? Supine knee ext hang x 1 min ?Supine hip ABD with assistance x  10 ?Supine heel slide with strap and assistance x 5 ?Sit to stand x 2 with cue for terminal knee extension and tall posture ?AROM left knee at end of session = 0 extension to 85 degrees flexion  ? ? ? ?  ?  ?PATIENT EDUCATION: Non this sesion ? ?Education details: Discussion on buckling concept with need for quad activation and TKE, HEP, gait training, ice for pain management, walking hourly and increased elevation of leg above heart and trial of gentle heat on upper quads not over knee ?Person educated: Patient ?Education method: Explanation, Demonstration, and Handouts ?Education comprehension: verbalized understanding, returned demonstration, and needs further education ?  ?  ?HOME EXERCISE PROGRAM: ?Access Code: [email or web address] ?URL: [email or web address] ?Date: 12/21/2021 ?Prepared by: [facility] ?  ?Exercises ?Seated Heel Slide - 1 x daily - 7 x weekly - 1 sets - 10 reps ?Supine Quad Set - 1 x daily - 7 x weekly - 1 sets - 10 reps ?Supine Gluteal Sets - 1 x daily - 7 x weekly - 1 sets - 10 reps ?Supine Ankle Pumps - 1 x daily - 7 x weekly - 2 sets - 10 reps ?Supine Heel Slide with Strap - 1 x daily - 7 x weekly - 1 sets - 10 reps ? ?Access Code: [email or web address] ?URL: [email or web address] ?Date: 12/26/2021 ?Prepared by: [facility] ? ?Exercises ?Seated Heel Toe Raises - 2-3 x daily - 7 x weekly - 1 sets - 10 reps ?Supine Hip Abduction - 2-3 x daily - 7 x weekly - 1 sets - 10 reps ?Seated Long Arc Quad - 2-3 x daily - 7 x weekly - 1 sets - 10 reps ?Seated Knee Flexion AAROM - 2-3 x daily - 7 x weekly - 1 sets - 10 reps ? ?  ?  ?ASSESSMENT: ?  ?CLINICAL IMPRESSION: ?Pt 18 minutes late to appointment today.  Began session with heel raises which pt was objecting to as she states that she had not done them before; therapist explained that we add exercises as she comes in to work on deficits, ( pt still can not get her own leg onto the bed and ROM is at 90 degrees.)  Added standing heel lifts and LAQ to  pt which pt was upset about.  When therapist had pt complete a quad set without a towel under her knee pt became very upset stating that she always has a towel under her knee.  Therapist explained that she w

## 2022-01-02 NOTE — Telephone Encounter (Signed)
1st no show: ? ?Called Patient; left message on answering machine that her next appointment is on 3/24 at 8:30.   ? ?Rayetta Humphrey, PT CLT ?[phone]  ?

## 2022-01-04 ENCOUNTER — Encounter (HOSPITAL_COMMUNITY): Payer: Self-pay

## 2022-01-04 ENCOUNTER — Other Ambulatory Visit: Payer: Self-pay

## 2022-01-04 ENCOUNTER — Ambulatory Visit (HOSPITAL_COMMUNITY): Payer: Medicare Other

## 2022-01-04 DIAGNOSIS — M25662 Stiffness of left knee, not elsewhere classified: Secondary | ICD-10-CM

## 2022-01-04 DIAGNOSIS — M25562 Pain in left knee: Secondary | ICD-10-CM | POA: Diagnosis not present

## 2022-01-04 DIAGNOSIS — M6281 Muscle weakness (generalized): Secondary | ICD-10-CM

## 2022-01-04 DIAGNOSIS — R2689 Other abnormalities of gait and mobility: Secondary | ICD-10-CM

## 2022-01-04 NOTE — Therapy (Signed)
?OUTPATIENT PHYSICAL THERAPY TREATMENT NOTE ? ? ?Patient Name: Sarah Cooper ?MRN: [id number] ?DOB:07-19-1951, 71 y.o., female ?Today's Date: 01/04/2022 ? ?PCP: Haywood Pao, MD ?REFERRING PROVIDER: Melrose Nakayama, MD ? ? PT End of Session - 01/02/22 2376   ? ? Visit Number 6   ? Number of Visits 12   ? Date for PT Re-Evaluation 01/18/22   ? Authorization Type U[employer] Medicare; copay 20.00   ? Authorization Time Period 10/14/21 to 10/13/22; no auth required follow MCR guidelines   ? Authorization - Visit Number 6   ? Progress Note Due on Visit 12   ? PT Start Time 2831   ? PT Stop Time 942  ? PT Time Calculation (min) 7mn   ? Activity Tolerance Patient tolerated treatment well   ? [facility]/performed   ? ?  ?  ? ?  ? ? ? ?Past Medical History:  ?Diagnosis Date  ? Arthritis   ? back, hips, knees  ? Cancer ([facility] 1978  ? cervical  ? [place] heart [place]) [place]   ? Phreesia 11/28/2020  ? Hyperlipidemia 12/01/2020  ? Hypertension   ? Pneumothorax 1977  ? PONV (postoperative nausea and vomiting)   ? ?Past Surgical History:  ?Procedure Laterality Date  ? BREAST BIOPSY Left   ? approx @ 48 benign   ? collapsed lung on right     ? 1970s  ? HERNIA REPAIR    ? age 71 ? HIP SURGERY  2007  ? right  ? JOINT REPLACEMENT  2007  ? TOTAL HIP ARTHROPLASTY Left 07/25/2020  ? Procedure: LEFT TOTAL HIP ARTHROPLASTY ANTERIOR APPROACH;  Surgeon: DMelrose Nakayama MD;  [facility];  Service: Orthopedics;  Laterality: Left;  ? TOTAL KNEE ARTHROPLASTY Left 12/18/2021  ? Procedure: LEFT TOTAL KNEE ARTHROPLASTY;  Surgeon: DMelrose Nakayama MD;  [facility];  Service: Orthopedics;  Laterality: Left;  ? ?Patient Active Problem List  ? Diagnosis Date Noted  ? Stage 11a chronic kidney disease ([place] 10/05/2021  ? Muscle spasm 02/06/2021  ? Insomnia, unspecified 12/01/2020  ? Hyperlipidemia 12/01/2020  ? Chronic diastolic heart failure ([facility] 12/01/2020  ? Leg edema 12/01/2020  ? Primary  localized osteoarthritis of left hip 07/25/2020  ? Tubular adenoma of colon 12/11/2016  ? Dyspnea 12/11/2016  ? Vitamin D deficiency 11/21/2016  ? Pre-diabetes 11/21/2016  ? Essential hypertension 11/19/2016  ? Depression, recurrent ([place] 11/19/2016  ? Osteoarthritis 11/19/2016  ? Chronic back pain greater than 3 months duration 11/19/2016  ? History of cervical dysplasia 11/19/2016  ? Morbid obesity ([facility] 11/19/2016  ? ? ?Patient Name: Sarah Cooper?MRN: [id number]?DOB:124-Jun-1952 71y.o., female ?Today's Date: 12/21/21 ?  ?  ?  ?    ?Past Medical History:  ?Diagnosis Date  ? Arthritis    ?  back, hips, knees  ? Cancer ([facility] 1978  ?  cervical  ? CH[place] (cong[place]ive heart failure) ([place]    ?  Phreesia 11/28/2020  ? Hyperlipidemia 12/01/2020  ? Hypertension    ? Pneumothorax 1977  ? PONV (postoperative nausea and vomiting)    ?  ?     ?Past Surgical History:  ?Procedure Laterality Date  ? BREAST BIOPSY Left    ?  approx @ 48 benign   ? collapsed lung on right       ?  1970s  ? HERNIA REPAIR      ?  age 71 ? HIP SURGERY  2007  ?  right  ? JOINT REPLACEMENT   2007  ? TOTAL HIP ARTHROPLASTY Left 07/25/2020  ?  Procedure: LEFT TOTAL HIP ARTHROPLASTY ANTERIOR APPROACH;  Surgeon: Melrose Nakayama, MD;  [facility];  Service: Orthopedics;  Laterality: Left;  ? TOTAL KNEE ARTHROPLASTY Left 12/18/2021  ?  Procedure: LEFT TOTAL KNEE ARTHROPLASTY;  Surgeon: Melrose Nakayama, MD;  [facility];  Service: Orthopedics;  Laterality: Left;  ?  ?    ?Patient Active Problem List  ?  Diagnosis Date Noted  ? Stage 3a chronic kidney disease ([place]) 10/05/2021  ? Muscle spasm 02/06/2021  ? Insomnia, unspecified 12/01/2020  ? Hyperlipidemia 12/01/2020  ? Chronic diastolic heart failure ([place]) 12/01/2020  ? Leg edema 12/01/2020  ? Primary localized osteoarthritis of left hip 07/25/2020  ? Tubular adenoma of colon 12/11/2016  ? Dyspnea 12/11/2016  ? Vitamin D deficiency 11/21/2016  ? Pre-diabetes 11/21/2016  ? Essential hypertension  11/19/2016  ? Depression, recurrent ([place]) 11/19/2016  ? Osteoarthritis 11/19/2016  ? Chronic back pain greater than 3 months duration 11/19/2016  ? History of cervical dysplasia 11/19/2016  ? Morbid obesity ([place]) 11/19/2016  ? ?SUBJECTIVE: Knee is sore today, pain scale 2/10 today. ?PAIN:  ?Are you having pain? Yes: NPRS scale: 2/10 ?Pain location: Left knee ?Pain description: sore, tender, stiff ?Aggravating factors: movement ?Relieving factors: ice, medication ?  ?  ?PCP: Tisovec, Fransico Him, MD ?  ?REFERRING PROVIDER: Melrose Nakayama, MD ?  ?REFERRING DIAG: PT EVAL/TX S/P L TOTAL KNEE REPL PER PETER DALLDORF, MD ?DOS ON 12/18/21  ?  ?THERAPY DIAG:  ?OA L knee ?  ?  ?OBJECTIVE:  ?TODAY'S TREATMENT: ? ?01/04/22: ? Nustep seat 9 x 5 min, SPM average 35 ? Standing: ?  Heel raise 10x ?  Knee flexion 10x ?  TKE GTB 10x 5" ?  Toe tapping 10x alternating 6in step height ? Supine: log rolling ?  Quad sets 10x ?  SAQ 5x ?  Heel slides 10x  ? Seated: ?  LAQ  ?  Slides ?  STS ? ?AROM 0-92 degrees ? ?                        01/02/2022: ?                         Standing:  Heel raises x 10 ?                                          Knee flexion x 10 ?                         Sitting :     LAQ with pulling knee backward x 5 ?                         Supine:    Quad set x 5  ?  ?12/31/21 =  ?Nustep seat 8, arms 12 x 8 min  ?Supine quad sets x 10 ?Supine glute sets x 10 ?Supine knee ext hang x 1 min mod thomas position ?Supine heel slide with strap and assistance x 5Seated heel/toe raises x 10 ?Seated hip abduction verse green band trial with pain ?Seated hip adduction verse blue ball trial x 10  ?  Seated heel slides x 10, with overpressure x 5 ?Seated LAQs with assistance x 5 ?Sit to stand x 2 with cue for terminal knee extension and tall posture ?AROM left knee at end of session = 0 extension to 90 degrees flexion  ? ?12/27/21 =  ?Nustep seat 9, arms 12 x 5 min then Nustep seat 8, arms 12 x 5 min ?Seated heel/toe raises x 10 ?Seated  marching with cue for knee relaxation x 10 ?Seated heel slides x 10, with overpressure x 5 ?Seated LAQs with assistance x 5 ?Supine quad sets x 10 ?Supine glute sets x 10 ? Supine knee ext hang x 1 min ?Supine hip ABD with assistance x 10 ?Supine heel slide with strap and assistance x 5 ?Sit to stand x 2 with cue for terminal knee extension and tall posture ?AROM left knee at end of session = 0 extension to 85 degrees flexion  ? ? ? ?  ?  ?PATIENT EDUCATION: Non this sesion ? ?Education details: Discussion on buckling concept with need for quad activation and TKE, HEP, gait training, ice for pain management, walking hourly and increased elevation of leg above heart and trial of gentle heat on upper quads not over knee ?Person educated: Patient ?Education method: Explanation, Demonstration, and Handouts ?Education comprehension: verbalized understanding, returned demonstration, and needs further education ?  ?  ?HOME EXERCISE PROGRAM: ?Access Code: [email or web address] ?URL: [email or web address] ?Date: 12/21/2021 ?Prepared by: [facility] ?  ?Exercises ?Seated Heel Slide - 1 x daily - 7 x weekly - 1 sets - 10 reps ?Supine Quad Set - 1 x daily - 7 x weekly - 1 sets - 10 reps ?Supine Gluteal Sets - 1 x daily - 7 x weekly - 1 sets - 10 reps ?Supine Ankle Pumps - 1 x daily - 7 x weekly - 2 sets - 10 reps ?Supine Heel Slide with Strap - 1 x daily - 7 x weekly - 1 sets - 10 reps ? ?Access Code: [email or web address] ?URL: [email or web address] ?Date: 12/26/2021 ?Prepared by: [facility] Re[facility] ? ?Exercises ?Seated Heel Toe Raises - 2-3 x daily - 7 x weekly - 1 sets - 10 reps ?Supine Hip Abduction - 2-3 x daily - 7 x weekly - 1 sets - 10 reps ?Seated Long Arc Quad - 2-3 x daily - 7 x weekly - 1 sets - 10 reps ?Seated Knee Flexion AAROM - 2-3 x daily - 7 x weekly - 1 sets - 10 reps ?3/24: standing marching with UE support and heel raises, increase quad/glut sets to 100 a day (not all at once). ?  ?  ?ASSESSMENT: ?  ?CLINICAL  IMPRESSION: ?Session focus with knee mobility and strengthening.  Pt educated on log rolling to increased ease with bed mobility as pt has difficulty lifting Lt LE.  Presents with significant weakness noted with

## 2022-01-07 ENCOUNTER — Other Ambulatory Visit: Payer: Self-pay

## 2022-01-07 ENCOUNTER — Ambulatory Visit (HOSPITAL_COMMUNITY): Payer: Medicare Other

## 2022-01-07 DIAGNOSIS — M25562 Pain in left knee: Secondary | ICD-10-CM

## 2022-01-07 DIAGNOSIS — R262 Difficulty in walking, not elsewhere classified: Secondary | ICD-10-CM

## 2022-01-07 DIAGNOSIS — R2689 Other abnormalities of gait and mobility: Secondary | ICD-10-CM

## 2022-01-07 DIAGNOSIS — R600 Localized edema: Secondary | ICD-10-CM

## 2022-01-07 DIAGNOSIS — M25662 Stiffness of left knee, not elsewhere classified: Secondary | ICD-10-CM

## 2022-01-07 DIAGNOSIS — M6281 Muscle weakness (generalized): Secondary | ICD-10-CM

## 2022-01-07 NOTE — Therapy (Signed)
OUTPATIENT PHYSICAL THERAPY TREATMENT NOTE   Patient Name: Sarah Cooper MRN: [id number] DOB:06-07-1951, 71 y.o., female Today's Date: 01/07/2022  PCP: Gaspar Garbe, MD REFERRING PROVIDER: Marcene Corning, MD   PT End of Session - 01/02/22 [phone]     Visit Number 8   Number of Visits 12    Date for PT Re-Evaluation 01/18/22    Authorization Type UHC Medicare; copay 20.00    Authorization Time Period 10/14/21 to 10/13/22; no auth required follow MCR guidelines    Authorization - Visit Number 8   Progress Note Due on Visit 12    PT Start Time 0816   PT Stop Time 0856   PT Time Calculation (min) 40 min   Activity Tolerance Patient tolerated treatment well    Behavior During Therapy [facility] for tasks assessed/performed              Past Medical History:  Diagnosis Date   Arthritis    back, hips, knees   Cancer (HCC) 1978   cervical   CHF (congestive heart failure) (HCC)    Phreesia 11/28/2020   Hyperlipidemia 12/01/2020   Hypertension    Pneumothorax 1977   PONV (postoperative nausea and vomiting)    Past Surgical History:  Procedure Laterality Date   BREAST BIOPSY Left    approx @ 48 benign    collapsed lung on right      1970s   HERNIA REPAIR     age 40   HIP SURGERY  2007   right   JOINT REPLACEMENT  2007   TOTAL HIP ARTHROPLASTY Left 07/25/2020   Procedure: LEFT TOTAL HIP ARTHROPLASTY ANTERIOR APPROACH;  Surgeon: Marcene Corning, MD;  [facility];  Service: Orthopedics;  Laterality: Left;   TOTAL KNEE ARTHROPLASTY Left 12/18/2021   Procedure: LEFT TOTAL KNEE ARTHROPLASTY;  Surgeon: Marcene Corning, MD;  [facility];  Service: Orthopedics;  Laterality: Left;   Patient Active Problem List   Diagnosis Date Noted   Stage 3a chronic kidney disease (HCC) 10/05/2021   Muscle spasm 02/06/2021   Insomnia, unspecified 12/01/2020   Hyperlipidemia 12/01/2020   Chronic diastolic heart failure (HCC) 12/01/2020   Leg edema 12/01/2020   Primary  localized osteoarthritis of left hip 07/25/2020   Tubular adenoma of colon 12/11/2016   Dyspnea 12/11/2016   Vitamin D deficiency 11/21/2016   Pre-diabetes 11/21/2016   Essential hypertension 11/19/2016   Depression, recurrent (HCC) 11/19/2016   Osteoarthritis 11/19/2016   Chronic back pain greater than 3 months duration 11/19/2016   History of cervical dysplasia 11/19/2016   Morbid obesity (HCC) 11/19/2016    Patient Name: Sarah Cooper MRN: [id number] DOB:[birth year], 71 y.o., female Today's Date: 12/21/21           Past Medical History:  Diagnosis Date   Arthritis      back, hips, knees   Cancer (HCC) 1978    cervical   CHF (congestive heart failure) (HCC)      Phreesia 11/28/2020   Hyperlipidemia 12/01/2020   Hypertension     Pneumothorax 1977   PONV (postoperative nausea and vomiting)           Past Surgical History:  Procedure Laterality Date   BREAST BIOPSY Left      approx @ 48 benign    collapsed lung on right         1970s   HERNIA REPAIR        age 78   HIP SURGERY   2007  right   JOINT REPLACEMENT   2007   TOTAL HIP ARTHROPLASTY Left 07/25/2020    Procedure: LEFT TOTAL HIP ARTHROPLASTY ANTERIOR APPROACH;  Surgeon: Marcene Corning, MD;  [facility];  Service: Orthopedics;  Laterality: Left;   TOTAL KNEE ARTHROPLASTY Left 12/18/2021    Procedure: LEFT TOTAL KNEE ARTHROPLASTY;  Surgeon: Marcene Corning, MD;  [facility];  Service: Orthopedics;  Laterality: Left;        Patient Active Problem List    Diagnosis Date Noted   Stage 3a chronic kidney disease (HCC) 10/05/2021   Muscle spasm 02/06/2021   Insomnia, unspecified 12/01/2020   Hyperlipidemia 12/01/2020   Chronic diastolic heart failure (HCC) 12/01/2020   Leg edema 12/01/2020   Primary localized osteoarthritis of left hip 07/25/2020   Tubular adenoma of colon 12/11/2016   Dyspnea 12/11/2016   Vitamin D deficiency 11/21/2016   Pre-diabetes 11/21/2016   Essential hypertension  11/19/2016   Depression, recurrent (HCC) 11/19/2016   Osteoarthritis 11/19/2016   Chronic back pain greater than 3 months duration 11/19/2016   History of cervical dysplasia 11/19/2016   Morbid obesity (HCC) 11/19/2016   SUBJECTIVE: Patient stopped wearing her compression stocking on Friday and over the weekend  and today she has increased pain, bruising and redness Left lower leg. Very tender to touch and noticeable hardness.  Therapist called Dr. Nolon Nations office to notify of above.  PAIN:  Are you having pain? Yes: NPRS scale: 4-6/10 Pain location: Left knee Pain description: sore, tender, stiff Aggravating factors: movement Relieving factors: ice, medication     PCP: Tisovec, Adelfa Koh, MD   REFERRING PROVIDER: Marcene Corning, MD   REFERRING DIAG: PT EVAL/TX S/P L TOTAL KNEE REPL PER PETER DALLDORF, MD DOS ON 12/18/21    THERAPY DIAG:  OA L knee     OBJECTIVE:  TODAY'S TREATMENT:  01/07/22 Quad sets x 10 Ankle pumps x 10 Seated heel slides x 10 Seated heel/toe raises x 10 Seated hip abdomen x 10 1 lap around the gym with RW and SBA; focus on improving heel strike and toe off.     01/04/22:  Nustep seat 9 x 5 min, SPM average 35  Standing:   Heel raise 10x   Knee flexion 10x   TKE GTB 10x 5"   Toe tapping 10x alternating 6in step height  Supine: log rolling   Quad sets 10x   SAQ 5x   Heel slides 10x   Seated:   LAQ    Slides   STS  AROM 0-92 degrees                          01/02/2022:                          Standing:  Heel raises x 10                                           Knee flexion x 10                          Sitting :     LAQ with pulling knee backward x 5  Supine:    Quad set x 5    12/31/21 =  Nustep seat 8, arms 12 x 8 min  Supine quad sets x 10 Supine glute sets x 10 Supine knee ext hang x 1 min mod thomas position Supine heel slide with strap and assistance x 5Seated heel/toe raises x 10 Seated hip  abduction verse green band trial with pain Seated hip adduction verse blue ball trial x 10  Seated heel slides x 10, with overpressure x 5 Seated LAQs with assistance x 5 Sit to stand x 2 with cue for terminal knee extension and tall posture AROM left knee at end of session = 0 extension to 90 degrees flexion   12/27/21 =  Nustep seat 9, arms 12 x 5 min then Nustep seat 8, arms 12 x 5 min Seated heel/toe raises x 10 Seated marching with cue for knee relaxation x 10 Seated heel slides x 10, with overpressure x 5 Seated LAQs with assistance x 5 Supine quad sets x 10 Supine glute sets x 10  Supine knee ext hang x 1 min Supine hip ABD with assistance x 10 Supine heel slide with strap and assistance x 5 Sit to stand x 2 with cue for terminal knee extension and tall posture AROM left knee at end of session = 0 extension to 85 degrees flexion         PATIENT EDUCATION: discussed with patient s/s of infection and blood clots.  Also discussed with patient blood pooling in the lower leg and the importance of continued movement; walking hourly.  Education on use of ice for pain management.    Education details:see above  Person educated: Patient Education method: Programmer, multimedia, Facilities manager, and Handouts Education comprehension: verbalized understanding, returned demonstration, and needs further education     HOME EXERCISE PROGRAM: Access Code: [email or web address] URL: [email or web address] Date: 12/21/2021 Prepared by: [facility] - Rehab   Exercises Seated Heel Slide - 1 x daily - 7 x weekly - 1 sets - 10 reps Supine Quad Set - 1 x daily - 7 x weekly - 1 sets - 10 reps Supine Gluteal Sets - 1 x daily - 7 x weekly - 1 sets - 10 reps Supine Ankle Pumps - 1 x daily - 7 x weekly - 2 sets - 10 reps Supine Heel Slide with Strap - 1 x daily - 7 x weekly - 1 sets - 10 reps  Access Code: [email or web address] URL: [email or web address] Date: 12/26/2021 Prepared by: [facility] -  Rehab  Exercises Seated Heel Toe Raises - 2-3 x daily - 7 x weekly - 1 sets - 10 reps Supine Hip Abduction - 2-3 x daily - 7 x weekly - 1 sets - 10 reps Seated Long Arc Quad - 2-3 x daily - 7 x weekly - 1 sets - 10 reps Seated Knee Flexion AAROM - 2-3 x daily - 7 x weekly - 1 sets - 10 reps 3/24: standing marching with UE support and heel raises, increase quad/glut sets to 100 a day (not all at once).     ASSESSMENT:   CLINICAL IMPRESSION: Session today started with patient increased compliant of pain in the lower leg. Therapist looked at her lower leg and she does have significant bruising and increased redness and significant increased pain on palpation today.  Therapist called MD office to notify and requested a call back regarding how to address.   OBJECTIVE IMPAIRMENTS Abnormal gait, decreased activity tolerance, decreased balance, decreased endurance, decreased mobility, difficulty walking, decreased ROM,  decreased strength, decreased safety awareness, increased edema, impaired perceived functional ability, increased muscle spasms, impaired flexibility, and pain.    ACTIVITY LIMITATIONS cleaning, community activity, driving, meal prep, laundry, yard work, shopping, and yard work.    PERSONAL FACTORS Fitness are also affecting patient's functional outcome.      REHAB POTENTIAL: Good   CLINICAL DECISION MAKING: Stable/uncomplicated   EVALUATION COMPLEXITY: Low     GOALS: Goals reviewed with patient? Yes   SHORT TERM GOALS:   Patient will be independent in initial HEP to improve functional outcomes.   Target date: 01/04/2022 Goal status: Ongoing   2.  Patient will improve Left  knee AAROM flexion in sitting to 90 degrees to improve functional mobility in home Baseline: 62 Target date: 01/04/2022 Goal status: Ongoing   LONG TERM GOALS:   Long term goal: Patient will be independent with advanced HEP and self management strategies to improve quality of life and functional  outcomes.   Target date: 01/18/2022 Goal status: Ongoing   2.  Patient will meet predicted FOTO score to demonstrate improved overall function.   Baseline: 21 Target date: 01/18/2022 Goal status: Ongoing   3.  Patient will report at least 50% improvement in overall symptoms and/or function to demonstrate improved functional mobility   Target date: 01/18/2022 Goal status: Ongoing   4.  Long term goal: Patient will demonstrate improved Left knee AROM to -5 to 110  to be able to safely navigate community surfaces.    Baseline: Left knee ext -15, Left knee flex 62 Target date: 01/18/2022 Goal status: Ongoing   5.  Long term goal: Patient will increase Left knee MMTs to 5/5 to promote return to ambulation community distances with minimal deviation   Target date: 01/18/2022 Goal status: Ongoing   6.  Patient will improve 30 sec STS score from 2  to 5  to demonstrate improved functional mobility and increased lower extremity strength.   Baseline: 2 Target date: 01/18/2022 Goal status: Ongoing   PLAN: PT FREQUENCY: 3x/week   PT DURATION: 4 weeks   PLANNED INTERVENTIONS: Therapeutic exercises, Therapeutic activity, Neuromuscular re-education, Balance training, Gait training, Patient/Family education, Joint manipulation, Joint mobilization, Stair training, DME instructions, Aquatic Therapy, Electrical stimulation, Cryotherapy, scar mobilization, Taping, and Manual therapy   PLAN FOR NEXT SESSION: monitor patient symptoms, lower leg pain and resume all exercises as able.  Try kinesiotape over lower leg to increase blood flow?   8:57 AM, 01/07/22 Jennessa Trigo Small Kalena Mander MPT [facility] [place] [phone]

## 2022-01-09 ENCOUNTER — Other Ambulatory Visit: Payer: Self-pay

## 2022-01-09 ENCOUNTER — Ambulatory Visit (HOSPITAL_COMMUNITY): Payer: Medicare Other | Admitting: Physical Therapy

## 2022-01-09 DIAGNOSIS — M25662 Stiffness of left knee, not elsewhere classified: Secondary | ICD-10-CM

## 2022-01-09 DIAGNOSIS — R262 Difficulty in walking, not elsewhere classified: Secondary | ICD-10-CM

## 2022-01-09 DIAGNOSIS — M25562 Pain in left knee: Secondary | ICD-10-CM | POA: Diagnosis not present

## 2022-01-09 DIAGNOSIS — R2689 Other abnormalities of gait and mobility: Secondary | ICD-10-CM

## 2022-01-09 DIAGNOSIS — M6281 Muscle weakness (generalized): Secondary | ICD-10-CM

## 2022-01-09 NOTE — Therapy (Signed)
?OUTPATIENT PHYSICAL THERAPY TREATMENT NOTE ? ? ?Patient Name: Sarah Cooper ?MRN: [id number] ?DOB:03/16/1951, 71 y.o., female ?Today's Date: 01/09/2022 ? ?PCP: Haywood Pao, MD ?REFERRING PROVIDER: Melrose Nakayama, MD ? ? PT End of Session - 01/09/22 6256   ? ? Visit Number 9   ? Number of Visits 12   ? Date for PT Re-Evaluation 01/18/22   ? Authorization Type UHC Medicare; copay 20.00   ? Authorization Time Period 10/14/21 to 10/13/22; no auth required follow MCR guidelines   ? Authorization - Visit Number 9   ? Progress Note Due on Visit 12   ? PT Start Time [id number]   ? PT Stop Time 7342   ? PT Time Calculation (min) 40 min   ? Activity Tolerance Patient tolerated treatment well   ? Behavior During Therapy [facility] for tasks assessed/performed   ? ?  ?  ? ?  ? ? ?Past Medical History:  ?Diagnosis Date  ? Arthritis   ? back, hips, knees  ? Cancer [facility]) 1978  ? cervical  ? CHF (congestive heart failure) ([place])   ? Phreesia 11/28/2020  ? Hyperlipidemia 12/01/2020  ? Hypertension   ? Pneumothorax 1977  ? PONV (postoperative nausea and vomiting)   ? ?Past Surgical History:  ?Procedure Laterality Date  ? BREAST BIOPSY Left   ? approx @ 48 benign   ? collapsed lung on right     ? 1970s  ? HERNIA REPAIR    ? age 59  ? HIP SURGERY  2007  ? right  ? JOINT REPLACEMENT  2007  ? TOTAL HIP ARTHROPLASTY Left 07/25/2020  ? Procedure: LEFT TOTAL HIP ARTHROPLASTY ANTERIOR APPROACH;  Surgeon: Melrose Nakayama, MD;  Location: [facility];  Service: Orthopedics;  Laterality: Left;  ? TOTAL KNEE ARTHROPLASTY Left 12/18/2021  ? Procedure: LEFT TOTAL KNEE ARTHROPLASTY;  Surgeon: Melrose Nakayama, MD;  Location: [facility];  Service: Orthopedics;  Laterality: Left;  ? ?Patient Active Problem List  ? Diagnosis Date Noted  ? Stage 3a chronic kidney disease ([place]) 10/05/2021  ? Muscle spasm 02/06/2021  ? Insomnia, unspecified 12/01/2020  ? Hyperlipidemia 12/01/2020  ? Chronic diastolic heart failure ([place]) 12/01/2020  ? Leg edema 12/01/2020  ? Primary  localized osteoarthritis of left hip 07/25/2020  ? Tubular adenoma of colon 12/11/2016  ? Dyspnea 12/11/2016  ? Vitamin D deficiency 11/21/2016  ? Pre-diabetes 11/21/2016  ? Essential hypertension 11/19/2016  ? Depression, recurrent ([place]) 11/19/2016  ? Osteoarthritis 11/19/2016  ? Chronic back pain greater than 3 months duration 11/19/2016  ? History of cervical dysplasia 11/19/2016  ? Morbid obesity ([place]) 11/19/2016  ? ? ?Patient Name: Sarah Cooper ?MRN: [id number] ?DOB:1951/02/17, 71 y.o., female ?Today's Date: 12/21/21 ?  ?  ?  ?    ?Past Medical History:  ?Diagnosis Date  ? Arthritis    ?  back, hips, knees  ? Cancer [facility]) 1978  ?  cervical  ? CHF (congestive heart failure) ([place])    ?  Phreesia 11/28/2020  ? Hyperlipidemia 12/01/2020  ? Hypertension    ? Pneumothorax 1977  ? PONV (postoperative nausea and vomiting)    ?  ?     ?Past Surgical History:  ?Procedure Laterality Date  ? BREAST BIOPSY Left    ?  approx @ 48 benign   ? collapsed lung on right       ?  1970s  ? HERNIA REPAIR      ?  age 39  ? HIP SURGERY  2007  ?  right  ? JOINT REPLACEMENT   2007  ? TOTAL HIP ARTHROPLASTY Left 07/25/2020  ?  Procedure: LEFT TOTAL HIP ARTHROPLASTY ANTERIOR APPROACH;  Surgeon: Melrose Nakayama, MD;  [facility];  Service: Orthopedics;  Laterality: Left;  ? TOTAL KNEE ARTHROPLASTY Left 12/18/2021  ?  Procedure: LEFT TOTAL KNEE ARTHROPLASTY;  Surgeon: Melrose Nakayama, MD;  [facility];  Service: Orthopedics;  Laterality: Left;  ?  ?    ?Patient Active Problem List  ?  Diagnosis Date Noted  ? Stage 3a chronic kidney disease ([place]) 10/05/2021  ? Muscle spasm 02/06/2021  ? Insomnia, unspecified 12/01/2020  ? Hyperlipidemia 12/01/2020  ? Chronic diastolic heart failure ([place]) 12/01/2020  ? Leg edema 12/01/2020  ? Primary localized osteoarthritis of left hip 07/25/2020  ? Tubular adenoma of colon 12/11/2016  ? Dyspnea 12/11/2016  ? Vitamin D deficiency 11/21/2016  ? Pre-diabetes 11/21/2016  ? Essential hypertension  11/19/2016  ? Depression, recurrent ([place]) 11/19/2016  ? Osteoarthritis 11/19/2016  ? Chronic back pain greater than 3 months duration 11/19/2016  ? History of cervical dysplasia 11/19/2016  ? Morbid obesity ([place]) 11/19/2016 ?  ? ? ?SUBJECTIVE: Patient states she went to Dr. Jerald Kief office Monday after leaving therapy and everything checked out fine. States MD was not concerned with blood clots and instructed her to discontinue wearing the compression stockings since they were uncomfortable and difficult to get on/off.  STates her pain is lower today at 2-4/10.  ?PAIN:  ?Are you having pain? Yes: NPRS scale: 2-4/10 ?Pain location: Left knee ?Pain description: sore, tender, stiff ?Aggravating factors: movement ?Relieving factors: ice, medication ?  ?  ?PCP: Tisovec, Fransico Him, MD ?  ?REFERRING PROVIDER: Melrose Nakayama, MD ?  ?REFERRING DIAG: PT EVAL/TX S/P L TOTAL KNEE REPL PER PETER DALLDORF, MD ?DOS ON 12/18/21 PLEASE [facility] 12/21/21 ?  ?THERAPY DIAG:  ?OA L knee ?  ?ONSET DATE: s/p 12/18/21 ?  ?  ?PERTINENT HISTORY: ?Right Hip THA 2007, Left hip THA October 2021, still has some soreness L hip ?  ?PRECAUTIONS: Fall ?  ?WEIGHT BEARING RESTRICTIONS No ?  ?  ?  ?OBJECTIVE:  ?  ?TODAY'S TREATMENT: ?01/09/22: ?Nustep seat 7 x 5 min, SPM average 35 ? Standing: ?  8 box knee flexion stretch 10X10" ?  Heel raise 20x, toeraises 20X ?  Knee flexion 15x ?  TKE GTB 10x 5" ?  Toe tapping 10x alternating 8in step height ? Supine: Quad sets 10x ?  SAQ 5x ?  Heel slides 10x  ? Seated: ?  LAQ  10X ?  STS 5X no UE assist ?AROM 0-100 degrees ?Manual:  retro and scar massage instruction ? ? ? ?  01/07/22 ?Quad sets x 10 ?Ankle pumps x 10 ?Seated heel slides x 10 ?Seated heel/toe raises x 10 ?Seated hip abdomen x 10 ?1 lap around the gym with RW and SBA; focus on improving heel strike and toe off.  ?  ?  ?  ?01/04/22: ?             Nustep seat 9 x 5 min, SPM average 35 ?             Standing: ?                         Heel raise 10x ?  Knee flexion 10x ?                         TKE GTB 10x 5" ?                         Toe tapping 10x alternating 6in step height ?                     Supine: log rolling ?                         Quad sets 10x ?                          SAQ 5x ?                         Heel slides 10x  ?             Seated: ?                         LAQ  ?                         Slides ?                         STS ?  ?AROM 0-92 degrees ?  ?  ?PATIENT EDUCATION:  ?Education details: edema and scar massage, RICE principle ?Person educated: Patient ?Education method: Explanation, Demonstration ?Education comprehension: verbalized understanding, returned demonstration, and needs further education ?  ?  ?HOME EXERCISE PROGRAM: ?Access Code: [email or web address] ?URL: [email or web address] ?Date: 12/21/2021 ?Prepared by: [facility] ?  ?Exercises ?Seated Heel Slide - 1 x daily - 7 x weekly - 1 sets - 10 reps ?Supine Quad Set - 1 x daily - 7 x weekly - 1 sets - 10 reps ?Supine Gluteal Sets - 1 x daily - 7 x weekly - 1 sets - 10 reps ?Supine Ankle Pumps - 1 x daily - 7 x weekly - 2 sets - 10 reps ?Supine Heel Slide with Strap - 1 x daily - 7 x weekly - 1 sets - 10 reps ? ?Access Code: [email or web address] ?URL: [email or web address] ?Date: 12/26/2021 ?Prepared by: [facility] ? ?Exercises ?Seated Heel Toe Raises - 2-3 x daily - 7 x weekly - 1 sets - 10 reps ?Supine Hip Abduction - 2-3 x daily - 7 x weekly - 1 sets - 10 reps ?Seated Long Arc Quad - 2-3 x daily - 7 x weekly - 1 sets - 10 reps ?Seated Knee Flexion AAROM - 2-3 x daily - 7 x weekly - 1 sets - 10 reps ? ?3/24: standing marching with UE support and heel raises, increase quad/glut sets to 100 a day (not all at once). ?  ?  ?ASSESSMENT: ?  ?CLINICAL IMPRESSION: ? Began with warm up on nustep and ability to be seated closer to pedals for improved flexion.  Continued with focus on improving Lt knee flexion and return to previous function.  Pt continues to use RW  for gait but still with pain, edema and residual soreness.  Pt instructed with self scar and edema massage as these are still limiting factors in improving her ROM.  Pt was sensitive  to massage so proceed

## 2022-01-10 NOTE — Therapy (Signed)
OUTPATIENT PHYSICAL THERAPY TREATMENT NOTE   Patient Name: Sarah Cooper MRN: [id number] DOB:05/09/1951, 71 y.o., female Today's Date: 01/11/2022  PCP: Gaspar Garbe, MD REFERRING PROVIDER: Marcene Corning, MD   PT End of Session - 01/11/22 0825     Visit Number 10    Number of Visits 12    Date for PT Re-Evaluation 01/18/22    Authorization Type UHC Medicare; copay 20.00    Authorization Time Period 10/14/21 to 10/13/22; no auth required follow MCR guidelines    Authorization - Visit Number 10    Progress Note Due on Visit 12    PT Start Time 0821    PT Stop Time 0904    PT Time Calculation (min) 43 min    Activity Tolerance Patient tolerated treatment well    Behavior During Therapy [facility] for tasks assessed/performed              Past Medical History:  Diagnosis Date   Arthritis    back, hips, knees   Cancer (HCC) 1978   cervical   CHF (congestive heart failure) (HCC)    Phreesia 11/28/2020   Hyperlipidemia 12/01/2020   Hypertension    Pneumothorax 1977   PONV (postoperative nausea and vomiting)    Past Surgical History:  Procedure Laterality Date   BREAST BIOPSY Left    approx @ 48 benign    collapsed lung on right      1970s   HERNIA REPAIR     age 59   HIP SURGERY  2007   right   JOINT REPLACEMENT  2007   TOTAL HIP ARTHROPLASTY Left 07/25/2020   Procedure: LEFT TOTAL HIP ARTHROPLASTY ANTERIOR APPROACH;  Surgeon: Marcene Corning, MD;  [facility];  Service: Orthopedics;  Laterality: Left;   TOTAL KNEE ARTHROPLASTY Left 12/18/2021   Procedure: LEFT TOTAL KNEE ARTHROPLASTY;  Surgeon: Marcene Corning, MD;  [facility];  Service: Orthopedics;  Laterality: Left;   Patient Active Problem List   Diagnosis Date Noted   Stage 36a chronic kidney disease (HCC) 10/05/2021   Muscle spasm 02/06/2021   Insomnia, unspecified 12/01/2020   Hyperlipidemia 12/01/2020   Chronic diastolic heart failure (HCC) 12/01/2020   Leg edema 12/01/2020   Primary  localized osteoarthritis of left hip 07/25/2020   Tubular adenoma of colon 12/11/2016   Dyspnea 12/11/2016   Vitamin D deficiency 11/21/2016   Pre-diabetes 11/21/2016   Essential hypertension 11/19/2016   Depression, recurrent (HCC) 11/19/2016   Osteoarthritis 11/19/2016   Chronic back pain greater than 3 months duration 11/19/2016   History of cervical dysplasia 11/19/2016   Morbid obesity (HCC) 11/19/2016    Patient Name: Sarah Cooper MRN: [id number] DOB:[birth year], 71 y.o., female Today's Date: 12/21/21           Past Medical History:  Diagnosis Date   Arthritis      back, hips, knees   Cancer (HCC) 1978    cervical   CHF (congestive heart failure) (HCC)      Phreesia 11/28/2020   Hyperlipidemia 12/01/2020   Hypertension     Pneumothorax 1977   PONV (postoperative nausea and vomiting)           Past Surgical History:  Procedure Laterality Date   BREAST BIOPSY Left      approx @ 48 benign    collapsed lung on right         1970s   HERNIA REPAIR        age 34   HIP  SURGERY   2007    right   JOINT REPLACEMENT   2007   TOTAL HIP ARTHROPLASTY Left 07/25/2020    Procedure: LEFT TOTAL HIP ARTHROPLASTY ANTERIOR APPROACH;  Surgeon: Marcene Corning, MD;  [facility]: [facility];  Service: Orthopedics;  Laterality: Left;   TOTAL KNEE ARTHROPLASTY Left 12/18/2021    Procedure: LEFT TOTAL KNEE ARTHROPLASTY;  Surgeon: Marcene Corning, MD;  [facility]: [facility];  Service: Orthopedics;  Laterality: Left;        Patient Active Problem List    Diagnosis Date Noted   Stage 3a chronic kidney disease (HCC) 10/05/2021   Muscle spasm 02/06/2021   Insomnia, unspecified 12/01/2020   Hyperlipidemia 12/01/2020   Chronic diastolic heart failure (HCC) 12/01/2020   Leg edema 12/01/2020   Primary localized osteoarthritis of left hip 07/25/2020   Tubular adenoma of colon 12/11/2016   Dyspnea 12/11/2016   Vitamin D deficiency 11/21/2016   Pre-diabetes 11/21/2016   Essential hypertension  11/19/2016   Depression, recurrent (HCC) 11/19/2016   Osteoarthritis 11/19/2016   Chronic back pain greater than 3 months duration 11/19/2016   History of cervical dysplasia 11/19/2016   Morbid obesity (HCC) 11/19/2016     SUBJECTIVE: Patient arrived a few min lat today. Patient reports she is overall sore but still much better than earlier in the week.  She sees Dr. Jerl Santos next week. She states she is having some pain at night more than during the day.  PAIN:  Are you having pain? Yes: NPRS scale: 2-4/10 Pain location: Left knee Pain description: sore, tender, stiff Aggravating factors: movement Relieving factors: ice, medication     PCP: Tisovec, Adelfa Koh, MD   REFERRING PROVIDER: Marcene Corning, MD   REFERRING DIAG: PT EVAL/TX S/P L TOTAL KNEE REPL PER PETER DALLDORF, MD DOS ON 12/18/21 PLEASE [facility] 12/21/21   THERAPY DIAG:  OA L knee   ONSET DATE: s/p 12/18/21     PERTINENT HISTORY: Right Hip THA 2007, Left hip THA October 2021, still has some soreness L hip   PRECAUTIONS: Fall   WEIGHT BEARING RESTRICTIONS No       OBJECTIVE:    TODAY'S TREATMENT: 01/11/22 Nustep seat 7 x 5 min, SPM average 35  Standing:   8 box knee flexion stretch 15X10"   Heel raise 20x, toeraises 20X   Slant board 5 x 20"   TKE GTB 10x 5"   Toe tapping 10x alternating 8in step height  Supine: Quad sets 10x   SAQ 5x   Heel slides 10x   Seated:   LAQ  10X   STS 5X no UE assist AROM 0-100 degrees Manual:  retro and scar massage instruction   01/09/22: Nustep seat 7 x 5 min, SPM average 35  Standing:   8 box knee flexion stretch 10X10"   Heel raise 20x, toeraises 20X   TKE GTB x 20   Toe tapping 10x alternating 8in step height   Standing Left hip ABD x 10  Seated heel slides x 10; with mild overpressure    // bar gait training with focus on improved heel strike and toe off and good posturing   AROM 0-102 degrees     01/07/22 Quad sets x 10 Ankle pumps x 10 Seated heel  slides x 10 Seated heel/toe raises x 10 Seated hip abdomen x 10 1 lap around the gym with RW and SBA; focus on improving heel strike and toe off.        01/04/22:  Nustep seat 9 x 5 min, SPM average 35              Standing:                          Heel raise 10x                           Knee flexion 10x                          TKE GTB 10x 5"                          Toe tapping 10x alternating 6in step height                      Supine: log rolling                          Quad sets 10x                           SAQ 5x                          Heel slides 10x               Seated:                          LAQ                           Slides                          STS   AROM 0-92 degrees     PATIENT EDUCATION:  Education details: edema and scar massage, RICE principle Person educated: Patient Education method: Explanation, Demonstration Education comprehension: verbalized understanding, returned demonstration, and needs further education     HOME EXERCISE PROGRAM: Access Code: [email or web address] URL: [email or web address] Date: 12/21/2021 Prepared by: [facility]   Exercises Seated Heel Slide - 1 x daily - 7 x weekly - 1 sets - 10 reps Supine Quad Set - 1 x daily - 7 x weekly - 1 sets - 10 reps Supine Gluteal Sets - 1 x daily - 7 x weekly - 1 sets - 10 reps Supine Ankle Pumps - 1 x daily - 7 x weekly - 2 sets - 10 reps Supine Heel Slide with Strap - 1 x daily - 7 x weekly - 1 sets - 10 reps  Access Code: [email or web address] URL: [email or web address] Date: 12/26/2021 Prepared by: [facility]  Exercises Seated Heel Toe Raises - 2-3 x daily - 7 x weekly - 1 sets - 10 reps Supine Hip Abduction - 2-3 x daily - 7 x weekly - 1 sets - 10 reps Seated Long Arc Quad - 2-3 x daily - 7 x weekly - 1 sets - 10 reps Seated Knee Flexion AAROM - 2-3 x daily - 7 x weekly - 1 sets - 10 reps  3/24: standing marching with UE support and heel raises, increase  quad/glut sets to 100 a day (not all at once).  ASSESSMENT:   CLINICAL IMPRESSION: Today's session continued to work on Left knee mobility and strength. She demonstrates overall continued improved mobility and today is able to take steps s AD in // bars with improved posture and heel strike.  Patient did not complain of feeling poorly but started vomiting in the parking lot.  Patient states she felt a little dizzy at the end of treatment and "sometimes this happens"  Her daughter was with her and escorted her to the car.  Patient will benefit from continued skilled PT services to address deficits and promote return to optimal functional mobility.    OBJECTIVE IMPAIRMENTS Abnormal gait, decreased activity tolerance, decreased balance, decreased endurance, decreased mobility, difficulty walking, decreased ROM, decreased strength, decreased safety awareness, increased edema, impaired perceived functional ability, increased muscle spasms, impaired flexibility, and pain.        GOALS: Goals reviewed with patient? Yes   SHORT TERM GOALS:   Patient will be independent in initial HEP to improve functional outcomes.   Target date: 01/04/2022 Goal status: ONGOING   2.  Patient will improve Left  knee AAROM flexion in sitting to 90 degrees to improve functional mobility in home Baseline: 62 Target date: 01/04/2022 Goal status: ONGOING   LONG TERM GOALS:   Long term goal: Patient will be independent with advanced HEP and self management strategies to improve quality of life and functional outcomes.   Target date: 01/18/2022 Goal status:  ONGOING  2.  Patient will meet predicted FOTO score to demonstrate improved overall function.   Baseline: 21 Target date: 01/18/2022 Goal status: ONGOING   3.  Patient will report at least 50% improvement in overall symptoms and/or function to demonstrate improved functional mobility   Target date: 01/18/2022 Goal status: ONGOING   4.  Long term goal:  Patient will demonstrate improved Left knee AROM to -5 to 110  to be able to safely navigate community surfaces.    Baseline: Left knee ext -15, Left knee flex 62 Target date: 01/18/2022 Goal status: INITIAL   5.  Long term goal: Patient will increase Left knee MMTs to 5/5 to promote return to ambulation community distances with minimal deviation   Target date: 01/18/2022 Goal status:  ONGOING  6.  Patient will improve 30 sec STS score from 2  to 5  to demonstrate improved functional mobility and increased lower extremity strength.   Baseline: 2 Target date: 01/18/2022 Goal status: ONGOING  PLAN: PT FREQUENCY: 3x/week   PT DURATION: 4 weeks   PLANNED INTERVENTIONS: Therapeutic exercises, Therapeutic activity, Neuromuscular re-education, Balance training, Gait training, Patient/Family education, Joint manipulation, Joint mobilization, Stair training, DME instructions, Aquatic Therapy, Electrical stimulation, Cryotherapy, scar mobilization, Taping, and Manual therapy   PLAN FOR NEXT SESSION: progress Left knee mobility and strengthening as able. Progress to standing exercises as able. Patient has appointment next week 4/5 with her MD      9:27 AM, 01/11/22 [place] Severs Small Carlton Sweaney MPT [place] [facility] [place] [phone]

## 2022-01-11 ENCOUNTER — Other Ambulatory Visit: Payer: Self-pay | Admitting: Cardiology

## 2022-01-11 ENCOUNTER — Ambulatory Visit (HOSPITAL_COMMUNITY): Payer: Medicare Other

## 2022-01-11 DIAGNOSIS — M6281 Muscle weakness (generalized): Secondary | ICD-10-CM

## 2022-01-11 DIAGNOSIS — R2689 Other abnormalities of gait and mobility: Secondary | ICD-10-CM

## 2022-01-11 DIAGNOSIS — R262 Difficulty in walking, not elsewhere classified: Secondary | ICD-10-CM

## 2022-01-11 DIAGNOSIS — M25662 Stiffness of left knee, not elsewhere classified: Secondary | ICD-10-CM

## 2022-01-11 DIAGNOSIS — R600 Localized edema: Secondary | ICD-10-CM

## 2022-01-11 DIAGNOSIS — E785 Hyperlipidemia, unspecified: Secondary | ICD-10-CM

## 2022-01-11 DIAGNOSIS — M25562 Pain in left knee: Secondary | ICD-10-CM | POA: Diagnosis not present

## 2022-01-14 ENCOUNTER — Ambulatory Visit (HOSPITAL_COMMUNITY): Payer: Medicare Other | Attending: Orthopaedic Surgery

## 2022-01-14 DIAGNOSIS — R262 Difficulty in walking, not elsewhere classified: Secondary | ICD-10-CM | POA: Insufficient documentation

## 2022-01-14 DIAGNOSIS — M25662 Stiffness of left knee, not elsewhere classified: Secondary | ICD-10-CM | POA: Diagnosis present

## 2022-01-14 DIAGNOSIS — M25562 Pain in left knee: Secondary | ICD-10-CM | POA: Diagnosis present

## 2022-01-14 DIAGNOSIS — R2689 Other abnormalities of gait and mobility: Secondary | ICD-10-CM | POA: Diagnosis present

## 2022-01-14 DIAGNOSIS — M25552 Pain in left hip: Secondary | ICD-10-CM | POA: Insufficient documentation

## 2022-01-14 DIAGNOSIS — R600 Localized edema: Secondary | ICD-10-CM | POA: Diagnosis present

## 2022-01-14 DIAGNOSIS — M6281 Muscle weakness (generalized): Secondary | ICD-10-CM | POA: Insufficient documentation

## 2022-01-14 NOTE — Therapy (Signed)
OUTPATIENT PHYSICAL THERAPY PROGRESS NOTE   Patient Name: Sarah Cooper MRN: [id number] DOB:December 13, 1950, 71 y.o., female Today's Date: 01/14/2022  PCP: Gaspar Garbe, MD REFERRING PROVIDER: Marcene Corning, MD Progress Note Reporting Period 12/21/21 to 01/14/22  See note below for Objective Data and Assessment of Progress/Goals.          PT End of Session - 01/14/22 0829     Visit Number 11    Number of Visits 12    Date for PT Re-Evaluation 01/18/22    Authorization Type [employer] Medicare; copay 20.00    Authorization Time Period 10/14/21 to 10/13/22; no auth required follow MCR guidelines    Authorization - Visit Number 11    Progress Note Due on Visit 12    PT Start Time 0821    PT Stop Time 0900    PT Time Calculation (min) 39 min    Equipment Utilized During Treatment Gait belt    Activity Tolerance Patient tolerated treatment well    Behavior During Therapy WFL for tasks assessed/performed               Past Medical History:  Diagnosis Date   Arthritis    back, hips, knees   Cancer (HCC) 1978   cervical   CHF (congestive heart failure) (HCC)    Phreesia 11/28/2020   Hyperlipidemia 12/01/2020   Hypertension    Pneumothorax 1977   PONV (postoperative nausea and vomiting)    Past Surgical History:  Procedure Laterality Date   BREAST BIOPSY Left    approx @ 48 benign    collapsed lung on right      1970s   HERNIA REPAIR     age 64   HIP SURGERY  2007   right   JOINT REPLACEMENT  2007   TOTAL HIP ARTHROPLASTY Left 07/25/2020   Procedure: LEFT TOTAL HIP ARTHROPLASTY ANTERIOR APPROACH;  Surgeon: Marcene Corning, MD;  Location: [facility];  Service: Orthopedics;  Laterality: Left;   TOTAL KNEE ARTHROPLASTY Left 12/18/2021   Procedure: LEFT TOTAL KNEE ARTHROPLASTY;  Surgeon: Marcene Corning, MD;  Location: [facility];  Service: Orthopedics;  Laterality: Left;   Patient Active Problem List   Diagnosis Date Noted   Stage 3a chronic kidney disease (HCC)  10/05/2021   Muscle spasm 02/06/2021   Insomnia, unspecified 12/01/2020   Hyperlipidemia 12/01/2020   Chronic diastolic heart failure (HCC) 12/01/2020   Leg edema 12/01/2020   Primary localized osteoarthritis of left hip 07/25/2020   Tubular adenoma of colon 12/11/2016   Dyspnea 12/11/2016   Vitamin D deficiency 11/21/2016   Pre-diabetes 11/21/2016   Essential hypertension 11/19/2016   Depression, recurrent (HCC) 11/19/2016   Osteoarthritis 11/19/2016   Chronic back pain greater than 3 months duration 11/19/2016   History of cervical dysplasia 11/19/2016   Morbid obesity (HCC) 11/19/2016    Patient Name: Sarah Cooper MRN: [id number] DOB:Oct 28, 1950, 71 y.o., female Today's Date: 12/21/21           Past Medical History:  Diagnosis Date   Arthritis      back, hips, knees   Cancer (HCC) 1978    cervical   CHF (congestive heart failure) (HCC)      Phreesia 11/28/2020   Hyperlipidemia 12/01/2020   Hypertension     Pneumothorax 1977   PONV (postoperative nausea and vomiting)           Past Surgical History:  Procedure Laterality Date   BREAST BIOPSY Left      approx @  48 benign    collapsed lung on right         1970s   HERNIA REPAIR        age 10   HIP SURGERY   2007    right   JOINT REPLACEMENT   2007   TOTAL HIP ARTHROPLASTY Left 07/25/2020    Procedure: LEFT TOTAL HIP ARTHROPLASTY ANTERIOR APPROACH;  Surgeon: Marcene Corning, MD;  [facility];  Service: Orthopedics;  Laterality: Left;   TOTAL KNEE ARTHROPLASTY Left 12/18/2021    Procedure: LEFT TOTAL KNEE ARTHROPLASTY;  Surgeon: Marcene Corning, MD;  [facility];  Service: Orthopedics;  Laterality: Left;        Patient Active Problem List    Diagnosis Date Noted   Stage 3a chronic kidney disease (HCC) 10/05/2021   Muscle spasm 02/06/2021   Insomnia, unspecified 12/01/2020   Hyperlipidemia 12/01/2020   Chronic diastolic heart failure (HCC) 12/01/2020   Leg edema 12/01/2020   Primary localized  osteoarthritis of left hip 07/25/2020   Tubular adenoma of colon 12/11/2016   Dyspnea 12/11/2016   Vitamin D deficiency 11/21/2016   Pre-diabetes 11/21/2016   Essential hypertension 11/19/2016   Depression, recurrent (HCC) 11/19/2016   Osteoarthritis 11/19/2016   Chronic back pain greater than 3 months duration 11/19/2016   History of cervical dysplasia 11/19/2016   Morbid obesity (HCC) 11/19/2016     SUBJECTIVE: Patient arrived a few min lat today. Patient reports she is overall sore but still much better than earlier in the week.  She sees Dr. Jerl Santos next week. She states she is having some pain at night more than during the day.  PAIN:  Are you having pain? Yes: NPRS scale: 1-2/10 Pain location: Left knee Pain description: sore, tender, stiff Aggravating factors: movement Relieving factors: ice, medication     PCP: Tisovec, Adelfa Koh, MD   REFERRING PROVIDER: Marcene Corning, MD   REFERRING DIAG: PT EVAL/TX S/P L TOTAL KNEE REPL PER PETER DALLDORF, MD DOS ON 12/18/21 PLEASE [facility] 12/21/21   THERAPY DIAG:  OA L knee   ONSET DATE: s/p 12/18/21     PERTINENT HISTORY: Right Hip THA 2007, Left hip THA October 2021, still has some soreness L hip   PRECAUTIONS: Fall   WEIGHT BEARING RESTRICTIONS No       OBJECTIVE:      TODAY'S TREATMENT:  01/14/22 Nustep seat 7 x 5 min arms 7 Heel raises x 20 Slant board 5 x 20" 12"  box knee flexion stretch 10 X 5" Ambulation with SPC and CGA x 226 ft; max fatigue after walking.   Sitting Left knee flexion 96 degrees AROM, 102 degrees AAROM Sitting Left knee extension -10 AROM sitting -8 supine MMTs: Left knee extension 3/5; knee flexion 3-/5    01/11/22 Nustep seat 7 x 5 min, SPM average 35  Standing:   8 box knee flexion stretch 15X10"   Heel raise 20x, toeraises 20X   Slant board 5 x 20"   TKE GTB 10x 5"   Toe tapping 10x alternating 8in step height  Supine: Quad sets 10x   SAQ 5x   Heel slides 10x    Seated:   LAQ  10X   STS 5X no UE assist AROM 0-100 degrees Manual:  retro and scar massage instruction   01/09/22: Nustep seat 7 x 5 min, SPM average 35  Standing:   8 box knee flexion stretch 10X10"   Heel raise 20x, toeraises 20X   TKE GTB x  20   Toe tapping 10x alternating 8in step height   Standing Left hip ABD x 10  Seated heel slides x 10; with mild overpressure    // bar gait training with focus on improved heel strike and toe off and good posturing   AROM 0-102 degrees     01/07/22 Quad sets x 10 Ankle pumps x 10 Seated heel slides x 10 Seated heel/toe raises x 10 Seated hip abdomen x 10 1 lap around the gym with RW and SBA; focus on improving heel strike and toe off.        01/04/22:              Nustep seat 9 x 5 min, SPM average 35              Standing:                          Heel raise 10x                           Knee flexion 10x                          TKE GTB 10x 5"                          Toe tapping 10x alternating 6in step height                      Supine: log rolling                          Quad sets 10x                           SAQ 5x                          Heel slides 10x               Seated:                          LAQ                           Slides                          STS   AROM 0-92 degrees     PATIENT EDUCATION:  Education details: edema and scar massage, RICE principle Person educated: Patient Education method: Explanation, Demonstration Education comprehension: verbalized understanding, returned demonstration, and needs further education     HOME EXERCISE PROGRAM: Access Code: [email or web address] URL: [email or web address] Date: 12/21/2021 Prepared by: [facility]   Exercises Seated Heel Slide - 1 x daily - 7 x weekly - 1 sets - 10 reps Supine Quad Set - 1 x daily - 7 x weekly - 1 sets - 10 reps Supine Gluteal Sets - 1 x daily - 7 x weekly - 1 sets - 10 reps Supine Ankle Pumps - 1 x daily - 7 x  weekly - 2 sets - 10 reps Supine Heel Slide with Strap - 1 x daily - 7 x  weekly - 1 sets - 10 reps  Access Code: [email or web address] URL: [email or web address] Date: 12/26/2021 Prepared by: [facility]  Exercises Seated Heel Toe Raises - 2-3 x daily - 7 x weekly - 1 sets - 10 reps Supine Hip Abduction - 2-3 x daily - 7 x weekly - 1 sets - 10 reps Seated Long Arc Quad - 2-3 x daily - 7 x weekly - 1 sets - 10 reps Seated Knee Flexion AAROM - 2-3 x daily - 7 x weekly - 1 sets - 10 reps  3/24: standing marching with UE support and heel raises, increase quad/glut sets to 100 a day (not all at once).     ASSESSMENT:   CLINICAL IMPRESSION: Progress note today. Patient has met STG's; making good progress overall.  She is able to walk with CGA and SPC today; continues with extension lag and therapist encourages her to continue with extension hang exercise at home.  Patient sees MD Wednesday at 8:45 am.  Patient will benefit from continued skilled PT services to address deficits and promote return to optimal functional mobility.    OBJECTIVE IMPAIRMENTS Abnormal gait, decreased activity tolerance, decreased balance, decreased endurance, decreased mobility, difficulty walking, decreased ROM, decreased strength, decreased safety awareness, increased edema, impaired perceived functional ability, increased muscle spasms, impaired flexibility, and pain.        GOALS: Goals reviewed with patient? Yes   SHORT TERM GOALS:   Patient will be independent in initial HEP to improve functional outcomes.   Target date: 01/04/2022 Goal status: Met  2.  Patient will improve Left  knee AAROM flexion in sitting to 90 degrees to improve functional mobility in home Baseline: 62 Target date: 01/04/2022 Goal status: Met   LONG TERM GOALS:   Long term goal: Patient will be independent with advanced HEP and self management strategies to improve quality of life and functional outcomes.   Target date:  02/10/2022 Goal status:  ONGOING  2.  Patient will meet predicted FOTO score to demonstrate improved overall function.   Baseline: 21 Target date: 02/10/2022 Goal status: ONGOING   3.  Patient will report at least 50% improvement in overall symptoms and/or function to demonstrate improved functional mobility   Target date: 02/10/2022 Goal status: ONGOING   4.  Long term goal: Patient will demonstrate improved Left knee AROM to -5 to 110  to be able to safely navigate community surfaces.    Baseline: Left knee ext -15, Left knee flex 62 Target date: 02/10/2022 Goal status: in progress    5.  Long term goal: Patient will increase Left knee MMTs to 5/5 to promote return to ambulation community distances with minimal deviation   Target date: 02/10/2022 Goal status:  in progress  6.  Patient will improve 30 sec STS score from 2  to 5  to demonstrate improved functional mobility and increased lower extremity strength.   Baseline: 2 Target date: 02/10/2022 Goal status: ONGOING  PLAN: PT FREQUENCY: 3x/week   PT DURATION: 4 weeks   PLANNED INTERVENTIONS: Therapeutic exercises, Therapeutic activity, Neuromuscular re-education, Balance training, Gait training, Patient/Family education, Joint manipulation, Joint mobilization, Stair training, DME instructions, Aquatic Therapy, Electrical stimulation, Cryotherapy, scar mobilization, Taping, and Manual therapy   PLAN FOR NEXT SESSION: progress Left knee mobility and strengthening as able. Progress to standing exercises as able. Patient has appointment next week 4/5 with her MD      9:05 AM, 01/14/22 Shawntay Prest Small Jeramyah Goodpasture MPT [facility] [place] [phone]

## 2022-01-16 ENCOUNTER — Encounter (HOSPITAL_COMMUNITY): Payer: Medicare Other | Admitting: Physical Therapy

## 2022-01-18 ENCOUNTER — Other Ambulatory Visit (HOSPITAL_COMMUNITY): Payer: Self-pay

## 2022-01-18 ENCOUNTER — Ambulatory Visit (HOSPITAL_COMMUNITY): Payer: Medicare Other

## 2022-01-18 ENCOUNTER — Encounter (HOSPITAL_COMMUNITY): Payer: Self-pay

## 2022-01-18 DIAGNOSIS — M25562 Pain in left knee: Secondary | ICD-10-CM

## 2022-01-18 DIAGNOSIS — R262 Difficulty in walking, not elsewhere classified: Secondary | ICD-10-CM

## 2022-01-18 DIAGNOSIS — M25662 Stiffness of left knee, not elsewhere classified: Secondary | ICD-10-CM

## 2022-01-18 DIAGNOSIS — M6281 Muscle weakness (generalized): Secondary | ICD-10-CM

## 2022-01-18 DIAGNOSIS — R600 Localized edema: Secondary | ICD-10-CM

## 2022-01-18 DIAGNOSIS — M25552 Pain in left hip: Secondary | ICD-10-CM

## 2022-01-18 DIAGNOSIS — R2689 Other abnormalities of gait and mobility: Secondary | ICD-10-CM | POA: Diagnosis not present

## 2022-01-18 NOTE — Therapy (Addendum)
?OUTPATIENT PHYSICAL THERAPY PROGRESS NOTE ? ? ?Patient Name: Sarah Cooper ?MRN: [id number] ?DOB:12/20/1950, 71 y.o., female ?Today's Date: 01/18/2022 ? ?PCP: Haywood Pao, MD ?REFERRING PROVIDER: Melrose Nakayama, MD ? ? ?Visit number12/21 ?Date of re-eval 02/08/22 ?Authorization: [facility] Medicare ?10/14/21 to 10/13/22; no auth required follow MCR guidelines ?Progress note due visit number: 21 ?[phone] ?Time: 38 minutes ?Gait belt ?Activity tolerance: patient tolerated well to treatment  ?Behavior: WFL for tasks assessed/performed ? ? ? ? ? ? ? ? ? ?Past Medical History:  ?Diagnosis Date  ? Arthritis   ? back, hips, knees  ? Cancer [facility]) 1978  ? cervical  ? CHF (congestive heart failure) ([place])   ? Phreesia 11/28/2020  ? Hyperlipidemia 12/01/2020  ? Hypertension   ? Pneumothorax 1977  ? PONV (postoperative nausea and vomiting)   ? ?Past Surgical History:  ?Procedure Laterality Date  ? BREAST BIOPSY Left   ? approx @ 48 benign   ? collapsed lung on right     ? 1970s  ? HERNIA REPAIR    ? age 34  ? HIP SURGERY  2007  ? right  ? JOINT REPLACEMENT  2007  ? TOTAL HIP ARTHROPLASTY Left 07/25/2020  ? Procedure: LEFT TOTAL HIP ARTHROPLASTY ANTERIOR APPROACH;  Surgeon: Melrose Nakayama, MD;  [facility]: [facility];  Service: Orthopedics;  Laterality: Left;  ? TOTAL KNEE ARTHROPLASTY Left 12/18/2021  ? Procedure: LEFT TOTAL KNEE ARTHROPLASTY;  Surgeon: Melrose Nakayama, MD;  Location: [facility];  Service: Orthopedics;  Laterality: Left;  ? ?Patient Active Problem List  ? Diagnosis Date Noted  ? Stage 3a chronic kidney disease ([place]) 10/05/2021  ? Muscle spasm 02/06/2021  ? Insomnia, unspecified 12/01/2020  ? Hyperlipidemia 12/01/2020  ? Chronic diastolic heart failure ([place]) 12/01/2020  ? Leg edema 12/01/2020  ? Primary localized osteoarthritis of left hip 07/25/2020  ? Tubular adenoma of colon 12/11/2016  ? Dyspnea 12/11/2016  ? Vitamin D deficiency 11/21/2016  ? Pre-diabetes 11/21/2016  ? Essential hypertension 11/19/2016  ?  Depression, recurrent ([place]) 11/19/2016  ? Osteoarthritis 11/19/2016  ? Chronic back pain greater than 3 months duration 11/19/2016  ? History of cervical dysplasia 11/19/2016  ? Morbid obesity ([place]) 11/19/2016  ? ? ?Patient Name: Sarah Cooper ?MRN: [id number] ?DOB:[birth year], 71 y.o., female ?Today's Date: 12/21/21 ?  ?  ?  ?    ?Past Medical History:  ?Diagnosis Date  ? Arthritis    ?  back, hips, knees  ? Cancer [facility]) 1978  ?  cervical  ? CHF (congestive heart failure) ([place])    ?  Phreesia 11/28/2020  ? Hyperlipidemia 12/01/2020  ? Hypertension    ? Pneumothorax 1977  ? PONV (postoperative nausea and vomiting)    ?  ?     ?Past Surgical History:  ?Procedure Laterality Date  ? BREAST BIOPSY Left    ?  approx @ 48 benign   ? collapsed lung on right       ?  1970s  ? HERNIA REPAIR      ?  age 52  ? HIP SURGERY   2007  ?  right  ? JOINT REPLACEMENT   2007  ? TOTAL HIP ARTHROPLASTY Left 07/25/2020  ?  Procedure: LEFT TOTAL HIP ARTHROPLASTY ANTERIOR APPROACH;  Surgeon: Melrose Nakayama, MD;  Location: [facility];  Service: Orthopedics;  Laterality: Left;  ? TOTAL KNEE ARTHROPLASTY Left 12/18/2021  ?  Procedure: LEFT TOTAL KNEE ARTHROPLASTY;  Surgeon: Melrose Nakayama, MD;  Location: [facility];  Service:  Orthopedics;  Laterality: Left;  ?  ?    ?Patient Active Problem List  ?  Diagnosis Date Noted  ? Stage 3a chronic kidney disease ([place]) 10/05/2021  ? Muscle spasm 02/06/2021  ? Insomnia, unspecified 12/01/2020  ? Hyperlipidemia 12/01/2020  ? Chronic diastolic heart failure ([place]) 12/01/2020  ? Leg edema 12/01/2020  ? Primary localized osteoarthritis of left hip 07/25/2020  ? Tubular adenoma of colon 12/11/2016  ? Dyspnea 12/11/2016  ? Vitamin D deficiency 11/21/2016  ? Pre-diabetes 11/21/2016  ? Essential hypertension 11/19/2016  ? Depression, recurrent ([place]) 11/19/2016  ? Osteoarthritis 11/19/2016  ? Chronic back pain greater than 3 months duration 11/19/2016  ? History of cervical dysplasia 11/19/2016  ? Morbid obesity ([place])  11/19/2016 ?  ? ? ?SUBJECTIVE:Pt reports she was getting out of the shower Tuesday and twisted knee with increased soreness and swelling following.  Saw MD Wednesday, happy with progress.  Hold on driving until able to ambulate with cane and no longer taking medication.  Admits she hasn't been as compliant with HEP as she knows she should.   ? ?PAIN:  ?Are you having pain? Yes: NPRS scale: 1-2/10 ?Pain location: Left knee ?Pain description: sore, tender, stiff ?Aggravating factors: movement ?Relieving factors: ice, medication ? Pt goals:  walk with SPC in 2 weeks to enable ability to drive.   ?  ?PCP: Haywood Pao, MD ?  ?REFERRING PROVIDER: Melrose Nakayama, MD ?  ?REFERRING DIAG: PT EVAL/TX S/P L TOTAL KNEE REPL PER PETER DALLDORF, MD ?DOS ON 12/18/21 PLEASE [facility] 12/21/21 ?  ?THERAPY DIAG:  ?OA L knee ?  ?ONSET DATE: s/p 12/18/21 ?  ?  ?PERTINENT HISTORY: ?Right Hip THA 2007, Left hip THA October 2021, still has some soreness L hip ?  ?PRECAUTIONS: Fall ?  ?WEIGHT BEARING RESTRICTIONS No ?  ?  ?  ?OBJECTIVE:  ? ? ?  ?TODAY'S TREATMENT: ?01/18/22 ?Bike rocking with holds for stretch end range seat 9 x 4 min ?SPC 239f ?Rockerboard lateral then DF/PF ?Knee drive 131VQheight 5x 10" ?5 STS no HHA eccentric control 2 sets ? ?AROM -7--105 degrees ?Educated benefits of compression hose, measurements taken and handout given for ETI. ? ? ?01/14/22 ?Nustep seat 7 x 5 min arms 7 ?Heel raises x 20 ?Slant board 5 x 20" ?12"  box knee flexion stretch 10 X 5" ?Ambulation with SPC and CGA x 226 ft; max fatigue after walking.  ? ?Sitting Left knee flexion 96 degrees AROM, 102 degrees AAROM ?Sitting Left knee extension -10 AROM sitting -8 supine ?MMTs: Left knee extension 3/5; knee flexion 3-/5 ? ? ? ?01/11/22 ?Nustep seat 7 x 5 min, SPM average 35 ? Standing: ?  8 box knee flexion stretch 15X10" ?  Heel raise 20x, toeraises 20X ?  Slant board 5 x 20" ?  TKE GTB 10x 5" ?  Toe tapping 10x alternating 8in step height ? Supine: Quad sets  10x ?  SAQ 5x ?  Heel slides 10x  ? Seated: ?  LAQ  10X ?  STS 5X no UE assist ?AROM 0-100 degrees ?Manual:  retro and scar massage instruction ? ? ?01/09/22: ?Nustep seat 7 x 5 min, SPM average 35 ? Standing: ?  8 box knee flexion stretch 10X10" ?  Heel raise 20x, toeraises 20X ?  TKE GTB x 20 ?  Toe tapping 10x alternating 8in step height ?  Standing Left hip ABD x 10 ? ?Seated heel slides x 10; with mild overpressure ? ?  //  bar gait training with focus on improved heel strike and toe off and good posturing ?  AROM 0-102 degrees ? ? ?  01/07/22 ?Quad sets x 10 ?Ankle pumps x 10 ?Seated heel slides x 10 ?Seated heel/toe raises x 10 ?Seated hip abdomen x 10 ?1 lap around the gym with RW and SBA; focus on improving heel strike and toe off.  ?  ?  ?  ?01/04/22: ?             Nustep seat 9 x 5 min, SPM average 35 ?             Standing: ?                         Heel raise 10x ?                          Knee flexion 10x ?                         TKE GTB 10x 5" ?                         Toe tapping 10x alternating 6in step height ?                     Supine: log rolling ?                         Quad sets 10x ?                          SAQ 5x ?                         Heel slides 10x  ?             Seated: ?                         LAQ  ?                         Slides ?                         STS ?  ?AROM 0-92 degrees ?  ?  ?PATIENT EDUCATION:  ?Education details: edema and scar massage, RICE principle ?Person educated: Patient ?Education method: Explanation, Demonstration ?Education comprehension: verbalized understanding, returned demonstration, and needs further education ?  ?  ?HOME EXERCISE PROGRAM: ?Access Code: [email or web address] ?URL: [email or web address] ?Date: 12/21/2021 ?Prepared by: [facility] ?  ?Exercises ?Seated Heel Slide - 1 x daily - 7 x weekly - 1 sets - 10 reps ?Supine Quad Set - 1 x daily - 7 x weekly - 1 sets - 10 reps ?Supine Gluteal Sets - 1 x daily - 7 x weekly - 1 sets - 10 reps ?Supine  Ankle Pumps - 1 x daily - 7 x weekly - 2 sets - 10 reps ?Supine Heel Slide with Strap - 1 x daily - 7 x weekly - 1 sets - 10 reps ? ?Access Code: [email or web address] ?URL: [email or web address] ?Date:

## 2022-01-24 ENCOUNTER — Ambulatory Visit (HOSPITAL_COMMUNITY): Payer: Medicare Other | Admitting: Physical Therapy

## 2022-01-24 ENCOUNTER — Encounter (HOSPITAL_COMMUNITY): Payer: Self-pay | Admitting: Physical Therapy

## 2022-01-24 DIAGNOSIS — M25662 Stiffness of left knee, not elsewhere classified: Secondary | ICD-10-CM

## 2022-01-24 DIAGNOSIS — R2689 Other abnormalities of gait and mobility: Secondary | ICD-10-CM | POA: Diagnosis not present

## 2022-01-24 DIAGNOSIS — M25562 Pain in left knee: Secondary | ICD-10-CM

## 2022-01-24 DIAGNOSIS — R262 Difficulty in walking, not elsewhere classified: Secondary | ICD-10-CM

## 2022-01-24 NOTE — Therapy (Addendum)
?OUTPATIENT PHYSICAL THERAPY PROGRESS NOTE ? ? ?Patient Name: Sarah Cooper ?MRN: [id number] ?DOB:June 29, 1951, 71 y.o., female ?Today's Date: 01/24/2022 ? ?PCP: Haywood Pao, MD ?REFERRING PROVIDER: Melrose Nakayama, MD ? ? ?Visit number12/21 ?Date of re-eval 02/08/22 ?Authorization: [facility] Medicare ?10/14/21 to 10/13/22; no auth required follow MCR guidelines ?Progress note due visit number: [phone] ?Time: 38 minutes ?Gait belt ?Activity tolerance: patient tolerated well to treatment  ?Behavior: WFL for tasks assessed/performed ? ? ? ? ? PT End of Session - 01/24/22 1409   ? ? Visit Number 13   ? Number of Visits 21   ? Date for PT Re-Evaluation 02/08/22   ? Authorization Type [employer] Medicare; copay 20.00   ? Authorization Time Period 10/14/21 to 10/13/22; no auth required follow MCR guidelines   ? Authorization - Visit Number 21   ? Progress Note Due on Visit 21   ? PT Start Time 1409   ? PT Stop Time 3329   ? PT Time Calculation (min) 36 min   ? Equipment Utilized During Treatment Gait belt   ? Activity Tolerance Patient tolerated treatment well   ? Behavior During Therapy [facility] for tasks assessed/performed   ? ?  ?  ? ?  ? ? ? ? ? ?Past Medical History:  ?Diagnosis Date  ? Arthritis   ? back, hips, knees  ? Cancer [facility]) 1978  ? cervical  ? CHF (congestive heart failure) ([place])   ? Phreesia 11/28/2020  ? Hyperlipidemia 12/01/2020  ? Hypertension   ? Pneumothorax 1977  ? PONV (postoperative nausea and vomiting)   ? ?Past Surgical History:  ?Procedure Laterality Date  ? BREAST BIOPSY Left   ? approx @ 48 benign   ? collapsed lung on right     ? 1970s  ? HERNIA REPAIR    ? age 77  ? HIP SURGERY  2007  ? right  ? JOINT REPLACEMENT  2007  ? TOTAL HIP ARTHROPLASTY Left 07/25/2020  ? Procedure: LEFT TOTAL HIP ARTHROPLASTY ANTERIOR APPROACH;  Surgeon: Melrose Nakayama, MD;  [facility]: [facility];  Service: Orthopedics;  Laterality: Left;  ? TOTAL KNEE ARTHROPLASTY Left 12/18/2021  ? Procedure: LEFT TOTAL KNEE ARTHROPLASTY;   Surgeon: Melrose Nakayama, MD;  [facility]: [facility];  Service: Orthopedics;  Laterality: Left;  ? ?Patient Active Problem List  ? Diagnosis Date Noted  ? Stage 3a chronic kidney disease ([place]) 10/05/2021  ? Muscle spasm 02/06/2021  ? Insomnia, unspecified 12/01/2020  ? Hyperlipidemia 12/01/2020  ? Chronic diastolic heart failure ([place]) 12/01/2020  ? Leg edema 12/01/2020  ? Primary localized osteoarthritis of left hip 07/25/2020  ? Tubular adenoma of colon 12/11/2016  ? Dyspnea 12/11/2016  ? Vitamin D deficiency 11/21/2016  ? Pre-diabetes 11/21/2016  ? Essential hypertension 11/19/2016  ? Depression, recurrent ([place]) 11/19/2016  ? Osteoarthritis 11/19/2016  ? Chronic back pain greater than 3 months duration 11/19/2016  ? History of cervical dysplasia 11/19/2016  ? Morbid obesity ([place]) 11/19/2016  ? ? ?Patient Name: Sarah Cooper ?MRN: [id number] ?DOB:Apr 28, 1951, 71 y.o., female ?Today's Date: 12/21/21 ?  ?  ?  ?    ?Past Medical History:  ?Diagnosis Date  ? Arthritis    ?  back, hips, knees  ? Cancer [facility]) 1978  ?  cervical  ? CHF (congestive heart failure) ([place])    ?  Phreesia 11/28/2020  ? Hyperlipidemia 12/01/2020  ? Hypertension    ? Pneumothorax 1977  ? PONV (postoperative nausea and vomiting)    ?  ?     ?  Past Surgical History:  ?Procedure Laterality Date  ? BREAST BIOPSY Left    ?  approx @ 48 benign   ? collapsed lung on right       ?  1970s  ? HERNIA REPAIR      ?  age 89  ? HIP SURGERY   2007  ?  right  ? JOINT REPLACEMENT   2007  ? TOTAL HIP ARTHROPLASTY Left 07/25/2020  ?  Procedure: LEFT TOTAL HIP ARTHROPLASTY ANTERIOR APPROACH;  Surgeon: Melrose Nakayama, MD;  [facility];  Service: Orthopedics;  Laterality: Left;  ? TOTAL KNEE ARTHROPLASTY Left 12/18/2021  ?  Procedure: LEFT TOTAL KNEE ARTHROPLASTY;  Surgeon: Melrose Nakayama, MD;  [facility];  Service: Orthopedics;  Laterality: Left;  ?  ?    ?Patient Active Problem List  ?  Diagnosis Date Noted  ? Stage 3a chronic kidney disease ([place]) 10/05/2021   ? Muscle spasm 02/06/2021  ? Insomnia, unspecified 12/01/2020  ? Hyperlipidemia 12/01/2020  ? Chronic diastolic heart failure ([place]) 12/01/2020  ? Leg edema 12/01/2020  ? Primary localized osteoarthritis of left hip 07/25/2020  ? Tubular adenoma of colon 12/11/2016  ? Dyspnea 12/11/2016  ? Vitamin D deficiency 11/21/2016  ? Pre-diabetes 11/21/2016  ? Essential hypertension 11/19/2016  ? Depression, recurrent ([place]) 11/19/2016  ? Osteoarthritis 11/19/2016  ? Chronic back pain greater than 3 months duration 11/19/2016  ? History of cervical dysplasia 11/19/2016  ? Morbid obesity ([place]) 11/19/2016 ?  ? ? ?SUBJECTIVE: Pt states that she is out of pain medication so her pain is up significantly today.  .   ? ?PAIN:  ?Are you having pain? Yes: NPRS scale: 4/10 ?Pain location: Left knee ?Pain description: sore, tender, stiff ?Aggravating factors: movement ?Relieving factors: ice, medication ? Pt goals:  walk with SPC in 2 weeks to enable ability to drive.   ?  ?PCP: Haywood Pao, MD ?  ?REFERRING PROVIDER: Melrose Nakayama, MD ?  ?REFERRING DIAG: PT EVAL/TX S/P L TOTAL KNEE REPL PER PETER DALLDORF, MD ?DOS ON 12/18/21 PLEASE [facility] 12/21/21 ?  ?THERAPY DIAG:  ?OA L knee ?  ?ONSET DATE: s/p 12/18/21 ?  ?  ?PERTINENT HISTORY: ?Right Hip THA 2007, Left hip THA October 2021, still has some soreness L hip ?  ?PRECAUTIONS: Fall ?  ?WEIGHT BEARING RESTRICTIONS No ?  ?  ? 01/24/2022 ?OBJECTIVE: ROM 2- 108 ?            Nustep seat at 5; hills 3; level 3 ?            Standing :  heel raises x 10 ?                              Knee driver for ROM 3 x 10" ?                              Terminal knee extension with ball behind knee x 10  ?           Prone:         knee flexion x 10 ?                               Hip extension with knee bent to 90 x 5 ?  Contract relax to improve flexion x 3 ?           Supine:      quad set x 5 ?                             Heelslide x 10 ? ?            Manual:  decongestive  techniques to Lt LE from hip to foot to decrease swelling, increase motion and decrease pain. ? ? ? ?  ?TODAY'S TREATMENT: ?01/18/22 ?Bike rocking with holds for stretch end range seat 9 x 4 min ?SPC 26f ?Rockerboard lateral then DF/PF ?Knee drive 154MGheight 5x 10" ?5 STS no HHA eccentric control 2 sets ? ?AROM -7--105 degrees ?Educated benefits of compression hose, measurements taken and handout given for ETI. ? ? ?01/14/22 ?Nustep seat 7 x 5 min arms 7 ?Heel raises x 20 ?Slant board 5 x 20" ?12"  box knee flexion stretch 10 X 5" ?Ambulation with SPC and CGA x 226 ft; max fatigue after walking.  ? ?Sitting Left knee flexion 96 degrees AROM, 102 degrees AAROM ?Sitting Left knee extension -10 AROM sitting -8 supine ?MMTs: Left knee extension 3/5; knee flexion 3-/5 ? ? ? ?01/11/22 ?Nustep seat 7 x 5 min, SPM average 35 ? Standing: ?  8 box knee flexion stretch 15X10" ?  Heel raise 20x, toeraises 20X ?  Slant board 5 x 20" ?  TKE GTB 10x 5" ?  Toe tapping 10x alternating 8in step height ? Supine: Quad sets 10x ?  SAQ 5x ?  Heel slides 10x  ? Seated: ?  LAQ  10X ?  STS 5X no UE assist ?AROM 0-100 degrees ?Manual:  retro and scar massage instruction ? ? ?01/09/22: ?Nustep seat 7 x 5 min, SPM average 35 ? Standing: ?  8 box knee flexion stretch 10X10" ?  Heel raise 20x, toeraises 20X ?  TKE GTB x 20 ?  Toe tapping 10x alternating 8in step height ?  Standing Left hip ABD x 10 ? ?Seated heel slides x 10; with mild overpressure ? ?  // bar gait training with focus on improved heel strike and toe off and good posturing ?  AROM 0-102 degrees ? ? ?  01/07/22 ?Quad sets x 10 ?Ankle pumps x 10 ?Seated heel slides x 10 ?Seated heel/toe raises x 10 ?Seated hip abdomen x 10 ?1 lap around the gym with RW and SBA; focus on improving heel strike and toe off.  ?  ?  ?  ?01/04/22: ?             Nustep seat 9 x 5 min, SPM average 35 ?             Standing: ?                         Heel raise 10x ?                          Knee flexion 10x ?                          TKE GTB 10x 5" ?                         Toe tapping 10x  alternating 6in step height ?                     Supine: log rolling ?                         Quad sets 10x ?

## 2022-01-29 ENCOUNTER — Encounter (HOSPITAL_COMMUNITY): Payer: Self-pay | Admitting: Physical Therapy

## 2022-01-29 ENCOUNTER — Ambulatory Visit (HOSPITAL_COMMUNITY): Payer: Medicare Other | Admitting: Physical Therapy

## 2022-01-29 DIAGNOSIS — R2689 Other abnormalities of gait and mobility: Secondary | ICD-10-CM | POA: Diagnosis not present

## 2022-01-29 DIAGNOSIS — R262 Difficulty in walking, not elsewhere classified: Secondary | ICD-10-CM

## 2022-01-29 DIAGNOSIS — M25662 Stiffness of left knee, not elsewhere classified: Secondary | ICD-10-CM

## 2022-01-29 DIAGNOSIS — M25562 Pain in left knee: Secondary | ICD-10-CM

## 2022-01-29 NOTE — Therapy (Signed)
?OUTPATIENT PHYSICAL THERAPY PROGRESS NOTE ? ? ?Patient Name: Sarah Cooper ?MRN: [id number] ?DOB:07-04-1951, 71 y.o., female ?Today's Date: 01/29/2022 ? ?PCP: Haywood Pao, MD ?REFERRING PROVIDER: Melrose Nakayama, MD ? ? ?Visit number12/21 ?Date of re-eval 02/08/22 ?Authorization: [facility] Medicare ?10/14/21 to 10/13/22; no auth required follow MCR guidelines ?Progress note due visit number: [phone] ?Time: 38 minutes ?Gait belt ?Activity tolerance: patient tolerated well to treatment  ?Behavior: WFL for tasks assessed/performed ? ? ? ? ? PT End of Session - 01/29/22 6160   ? ? Visit Number 14   ? Number of Visits 21   ? Date for PT Re-Evaluation 02/08/22   ? Authorization Type [employer] Medicare; copay 20.00   ? Authorization Time Period 10/14/21 to 10/13/22; no auth required follow MCR guidelines   ? Authorization - Visit Number 21   ? Progress Note Due on Visit 21   ? PT Start Time [phone]   ? PT Stop Time 0626  ? PT Time Calculation (min) 40 min   ? Equipment Utilized During Treatment Gait belt   ? Activity Tolerance Patient tolerated treatment well   ? Behavior During Therapy [facility] for tasks assessed/performed   ? ?  ?  ? ?  ? ? ? ? ? ?Past Medical History:  ?Diagnosis Date  ? Arthritis   ? back, hips, knees  ? Cancer [facility]) 1978  ? cervical  ? CHF (congestive heart failure) ([place])   ? Phreesia 11/28/2020  ? Hyperlipidemia 12/01/2020  ? Hypertension   ? Pneumothorax 1977  ? PONV (postoperative nausea and vomiting)   ? ?Past Surgical History:  ?Procedure Laterality Date  ? BREAST BIOPSY Left   ? approx @ 48 benign   ? collapsed lung on right     ? 1970s  ? HERNIA REPAIR    ? age 20  ? HIP SURGERY  2007  ? right  ? JOINT REPLACEMENT  2007  ? TOTAL HIP ARTHROPLASTY Left 07/25/2020  ? Procedure: LEFT TOTAL HIP ARTHROPLASTY ANTERIOR APPROACH;  Surgeon: Melrose Nakayama, MD;  [facility];  Service: Orthopedics;  Laterality: Left;  ? TOTAL KNEE ARTHROPLASTY Left 12/18/2021  ? Procedure: LEFT TOTAL KNEE ARTHROPLASTY;   Surgeon: Melrose Nakayama, MD;  [facility];  Service: Orthopedics;  Laterality: Left;  ? ?Patient Active Problem List  ? Diagnosis Date Noted  ? Stage 3a chronic kidney disease ([place]) 10/05/2021  ? Muscle spasm 02/06/2021  ? Insomnia, unspecified 12/01/2020  ? Hyperlipidemia 12/01/2020  ? Chronic diastolic heart failure ([place]) 12/01/2020  ? Leg edema 12/01/2020  ? Primary localized osteoarthritis of left hip 07/25/2020  ? Tubular adenoma of colon 12/11/2016  ? Dyspnea 12/11/2016  ? Vitamin D deficiency 11/21/2016  ? Pre-diabetes 11/21/2016  ? Essential hypertension 11/19/2016  ? Depression, recurrent ([place]) 11/19/2016  ? Osteoarthritis 11/19/2016  ? Chronic back pain greater than 3 months duration 11/19/2016  ? History of cervical dysplasia 11/19/2016  ? Morbid obesity ([place]) 11/19/2016  ? ? ?Patient Name: Sarah Cooper ?MRN: [id number] ?DOB:Jan 14, 1951, 71 y.o., female ?Today's Date: 12/21/21 ?  ?  ?  ?    ?Past Medical History:  ?Diagnosis Date  ? Arthritis    ?  back, hips, knees  ? Cancer [facility]) 1978  ?  cervical  ? CHF (congestive heart failure) ([place])    ?  Phreesia 11/28/2020  ? Hyperlipidemia 12/01/2020  ? Hypertension    ? Pneumothorax 1977  ? PONV (postoperative nausea and vomiting)    ?  ?     ?  Past Surgical History:  ?Procedure Laterality Date  ? BREAST BIOPSY Left    ?  approx @ 48 benign   ? collapsed lung on right       ?  1970s  ? HERNIA REPAIR      ?  age 12  ? HIP SURGERY   2007  ?  right  ? JOINT REPLACEMENT   2007  ? TOTAL HIP ARTHROPLASTY Left 07/25/2020  ?  Procedure: LEFT TOTAL HIP ARTHROPLASTY ANTERIOR APPROACH;  Surgeon: Melrose Nakayama, MD;  [facility];  Service: Orthopedics;  Laterality: Left;  ? TOTAL KNEE ARTHROPLASTY Left 12/18/2021  ?  Procedure: LEFT TOTAL KNEE ARTHROPLASTY;  Surgeon: Melrose Nakayama, MD;  [facility];  Service: Orthopedics;  Laterality: Left;  ?  ?    ?Patient Active Problem List  ?  Diagnosis Date Noted  ? Stage 3a chronic kidney disease ([place]) 10/05/2021   ? Muscle spasm 02/06/2021  ? Insomnia, unspecified 12/01/2020  ? Hyperlipidemia 12/01/2020  ? Chronic diastolic heart failure ([place]) 12/01/2020  ? Leg edema 12/01/2020  ? Primary localized osteoarthritis of left hip 07/25/2020  ? Tubular adenoma of colon 12/11/2016  ? Dyspnea 12/11/2016  ? Vitamin D deficiency 11/21/2016  ? Pre-diabetes 11/21/2016  ? Essential hypertension 11/19/2016  ? Depression, recurrent ([place]) 11/19/2016  ? Osteoarthritis 11/19/2016  ? Chronic back pain greater than 3 months duration 11/19/2016  ? History of cervical dysplasia 11/19/2016  ? Morbid obesity ([place]) 11/19/2016 ?  ? ? ?SUBJECTIVE: Pt states that she got into the shower by herself.  She is walking a little without her cane.   ?PAIN:  ?Are you having pain? Yes: NPRS scale: 2/10 ?Pain location: Left knee ?Pain description: sore, tender, stiff ?Aggravating factors: movement ?Relieving factors: ice, medication ? Pt goals:  walk with SPC in 2 weeks to enable ability to drive.   ?  ?PCP: Haywood Pao, MD ?  ?REFERRING PROVIDER: Melrose Nakayama, MD ?  ?REFERRING DIAG: PT EVAL/TX S/P L TOTAL KNEE REPL PER PETER DALLDORF, MD ?DOS ON 12/18/21 PLEASE [facility] 12/21/21 ?  ?THERAPY DIAG:  ?OA L knee ?  ?ONSET DATE: s/p 12/18/21 ?  ?  ?PERTINENT HISTORY: ?Right Hip THA 2007, Left hip THA October 2021, still has some soreness L hip ?  ?PRECAUTIONS: Fall ?  ?WEIGHT BEARING RESTRICTIONS No ? ?TODAY'S TREATMENT: ?01/29/2022 ROM 0-110 ?Nustep seat 5 x 5 min arms 7 to improve ROM ?Standing:  ?Stairs B UE reciprocal x 4 RT  ?Heel raises x 10 ?Squats x 10  ?Slant board 5 x 20" ?12"  box knee flexion stretch 5 X 20" ?Prone:   ?     knee flexion x 10 ?     Hip extension with knee bent to 90 x 5                             ?    Contract relax to improve flexion x 3 ?Supine:  heel slides x5 ?              Bridge x 10  ? ? ?Manual:  decongestive techniques to Lt LE from hip to foot to decrease swelling, increase motion and decrease pain. ? ?  ?  01/24/2022 ?OBJECTIVE: ROM 2- 108 ?            Nustep seat at 5; hills 3; level 3 ?  Standing :  heel raises x 10 ?                              Knee driver for ROM 3 x 10" ?                              Terminal knee extension with ball behind knee x 10  ?           Prone:         knee flexion x 10 ?                               Hip extension with knee bent to 90 x 5 ?                               Contract relax to improve flexion x 3 ?           Supine:      quad set x 5 ?                             Heelslide x 10 ? ?            Manual:  decongestive techniques to Lt LE from hip to foot to decrease swelling, increase motion and decrease pain. ? ? ? ? .  ?01/18/22 ?Bike rocking with holds for stretch end range seat 9 x 4 min ?SPC 210f ?Rockerboard lateral then DF/PF ?Knee drive 130ZSheight 5x 10" ?5 STS no HHA eccentric control 2 sets ? ?AROM -7--105 degrees ?Educated benefits of compression hose, measurements taken and handout given for ETI. ? ? ?01/14/22 ? ?Sitting Left knee flexion 96 degrees AROM, 102 degrees AAROM ?Sitting Left knee extension -10 AROM sitting -8 supine ?MMTs: Left knee extension 3/5; knee flexion 3-/5 ? ? ? ?01/11/22 ?Nustep seat 7 x 5 min, SPM average 35 ? Standing: ?  8 box knee flexion stretch 15X10" ?  Heel raise 20x, toeraises 20X ?  Slant board 5 x 20" ?  TKE GTB 10x 5" ?  Toe tapping 10x alternating 8in step height ? Supine: Quad sets 10x ?  SAQ 5x ?  Heel slides 10x  ? Seated: ?  LAQ  10X ?  STS 5X no UE assist ?AROM 0-100 degrees ?Manual:  retro and scar massage instruction ? ? ?01/09/22: ?Nustep seat 7 x 5 min, SPM average 35 ? Standing: ?  8 box knee flexion stretch 10X10" ?  Heel raise 20x, toeraises 20X ?  TKE GTB x 20 ?  Toe tapping 10x alternating 8in step height ?  Standing Left hip ABD x 10 ? ?Seated heel slides x 10; with mild overpressure ? ?  // bar gait training with focus on improved heel strike and toe off and good posturing ?  AROM 0-102 degrees ? ? ?   01/07/22 ?Quad sets x 10 ?Ankle pumps x 10 ?Seated heel slides x 10 ?Seated heel/toe raises x 10 ?Seated hip abdomen x 10 ?1 lap around the gym with RW and SBA; focus on improving heel strike and toe off.  ?  ?  ?  ?01/04/22: ?

## 2022-02-05 ENCOUNTER — Ambulatory Visit (HOSPITAL_COMMUNITY): Payer: Medicare Other

## 2022-02-05 ENCOUNTER — Other Ambulatory Visit: Payer: Self-pay | Admitting: Internal Medicine

## 2022-02-05 ENCOUNTER — Encounter (HOSPITAL_COMMUNITY): Payer: Self-pay

## 2022-02-05 DIAGNOSIS — M25662 Stiffness of left knee, not elsewhere classified: Secondary | ICD-10-CM

## 2022-02-05 DIAGNOSIS — R2689 Other abnormalities of gait and mobility: Secondary | ICD-10-CM | POA: Diagnosis not present

## 2022-02-05 DIAGNOSIS — M25562 Pain in left knee: Secondary | ICD-10-CM

## 2022-02-05 DIAGNOSIS — Z1231 Encounter for screening mammogram for malignant neoplasm of breast: Secondary | ICD-10-CM

## 2022-02-05 DIAGNOSIS — R262 Difficulty in walking, not elsewhere classified: Secondary | ICD-10-CM

## 2022-02-05 NOTE — Therapy (Signed)
?OUTPATIENT PHYSICAL THERAPY PROGRESS NOTE ? ? ?Patient Name: Sarah Cooper ?MRN: [id number] ?DOB:1951-01-02, 71 y.o., female ?Today's Date: 02/05/2022 ? ?PCP: Haywood Pao, MD ?REFERRING PROVIDER: Melrose Nakayama, MD ? ? ? PT End of Session - 01/29/22 2353   ? ? Visit Number 14   ? Number of Visits 21   ? Date for PT Re-Evaluation 02/08/22   ? Authorization Type [employer] Medicare; copay 20.00   ? Authorization Time Period 10/14/21 to 10/13/22; no auth required follow MCR guidelines   ? Authorization - Visit Number 21   ? Progress Note Due on Visit 21   ? PT Start Time [phone]   ? PT Stop Time 3154  ? PT Time Calculation (min) 40 min   ? Equipment Utilized During Treatment Gait belt   ? Activity Tolerance Patient tolerated treatment well   ? Behavior During Therapy [facility] for tasks assessed/performed   ? ?  ?  ? ?  ? ? ? ? ? ?Past Medical History:  ?Diagnosis Date  ? Arthritis   ? back, hips, knees  ? Cancer [facility]) 1978  ? cervical  ? CHF (congestive heart failure) ([place])   ? Phreesia 11/28/2020  ? Hyperlipidemia 12/01/2020  ? Hypertension   ? Pneumothorax 1977  ? PONV (postoperative nausea and vomiting)   ? ?Past Surgical History:  ?Procedure Laterality Date  ? BREAST BIOPSY Left   ? approx @ 48 benign   ? collapsed lung on right     ? 1970s  ? HERNIA REPAIR    ? age 31  ? HIP SURGERY  2007  ? right  ? JOINT REPLACEMENT  2007  ? TOTAL HIP ARTHROPLASTY Left 07/25/2020  ? Procedure: LEFT TOTAL HIP ARTHROPLASTY ANTERIOR APPROACH;  Surgeon: Melrose Nakayama, MD;  Location: [facility];  Service: Orthopedics;  Laterality: Left;  ? TOTAL KNEE ARTHROPLASTY Left 12/18/2021  ? Procedure: LEFT TOTAL KNEE ARTHROPLASTY;  Surgeon: Melrose Nakayama, MD;  Location: [facility];  Service: Orthopedics;  Laterality: Left;  ? ?Patient Active Problem List  ? Diagnosis Date Noted  ? Stage 3a chronic kidney disease ([place]) 10/05/2021  ? Muscle spasm 02/06/2021  ? Insomnia, unspecified 12/01/2020  ? Hyperlipidemia 12/01/2020  ? Chronic diastolic heart  failure ([place]) 12/01/2020  ? Leg edema 12/01/2020  ? Primary localized osteoarthritis of left hip 07/25/2020  ? Tubular adenoma of colon 12/11/2016  ? Dyspnea 12/11/2016  ? Vitamin D deficiency 11/21/2016  ? Pre-diabetes 11/21/2016  ? Essential hypertension 11/19/2016  ? Depression, recurrent ([place]) 11/19/2016  ? Osteoarthritis 11/19/2016  ? Chronic back pain greater than 3 months duration 11/19/2016  ? History of cervical dysplasia 11/19/2016  ? Morbid obesity ([place]) 11/19/2016  ? ? ?Patient Name: Sarah Cooper ?MRN: [id number] ?DOB:07-20-1951, 71 y.o., female ?Today's Date: 12/21/21 ?  ?  ?  ?    ?Past Medical History:  ?Diagnosis Date  ? Arthritis    ?  back, hips, knees  ? Cancer [facility]) 1978  ?  cervical  ? CHF (congestive heart failure) ([place])    ?  Phreesia 11/28/2020  ? Hyperlipidemia 12/01/2020  ? Hypertension    ? Pneumothorax 1977  ? PONV (postoperative nausea and vomiting)    ?  ?     ?Past Surgical History:  ?Procedure Laterality Date  ? BREAST BIOPSY Left    ?  approx @ 48 benign   ? collapsed lung on right       ?  1970s  ? HERNIA REPAIR      ?  age 65  ? HIP SURGERY   2007  ?  right  ? JOINT REPLACEMENT   2007  ? TOTAL HIP ARTHROPLASTY Left 07/25/2020  ?  Procedure: LEFT TOTAL HIP ARTHROPLASTY ANTERIOR APPROACH;  Surgeon: Melrose Nakayama, MD;  [facility];  Service: Orthopedics;  Laterality: Left;  ? TOTAL KNEE ARTHROPLASTY Left 12/18/2021  ?  Procedure: LEFT TOTAL KNEE ARTHROPLASTY;  Surgeon: Melrose Nakayama, MD;  [facility];  Service: Orthopedics;  Laterality: Left;  ?  ?    ?Patient Active Problem List  ?  Diagnosis Date Noted  ? Stage 3a chronic kidney disease ([place]) 10/05/2021  ? Muscle spasm 02/06/2021  ? Insomnia, unspecified 12/01/2020  ? Hyperlipidemia 12/01/2020  ? Chronic diastolic heart failure ([place]) 12/01/2020  ? Leg edema 12/01/2020  ? Primary localized osteoarthritis of left hip 07/25/2020  ? Tubular adenoma of colon 12/11/2016  ? Dyspnea 12/11/2016  ? Vitamin D deficiency  11/21/2016  ? Pre-diabetes 11/21/2016  ? Essential hypertension 11/19/2016  ? Depression, recurrent ([place]) 11/19/2016  ? Osteoarthritis 11/19/2016  ? Chronic back pain greater than 3 months duration 11/19/2016  ? History of cervical dysplasia 11/19/2016  ? Morbid obesity ([place]) 11/19/2016 ?  ? ? ?SUBJECTIVE: Pt stated she has been walking at home without AD.  No real pain, just some burning over incision. ?PAIN:  ?Are you having pain? Yes: NPRS scale: 0/10 ?Pain location: Left knee ?Pain description: sore, tender, stiff ?Aggravating factors: movement ?Relieving factors: ice, medication ? Pt goals:  walk with SPC in 2 weeks to enable ability to drive.   ?  ?PCP: Haywood Pao, MD ?  ?REFERRING PROVIDER: Melrose Nakayama, MD ?  ?REFERRING DIAG: PT EVAL/TX S/P L TOTAL KNEE REPL PER PETER DALLDORF, MD ?DOS ON 12/18/21 PLEASE [facility] 12/21/21 ?  ?THERAPY DIAG:  ?OA L knee ?  ?ONSET DATE: s/p 12/18/21 ?  ?  ?PERTINENT HISTORY: ?Right Hip THA 2007, Left hip THA October 2021, still has some soreness L hip ?  ?PRECAUTIONS: Fall ?  ?WEIGHT BEARING RESTRICTIONS No ? ?TODAY'S TREATMENT: ?02/05/22 0-114 ?2MWT no AD 354f ?Bike seat 9 able to go backwards x 454m ? ?Knee drive 5x 10" 2nd step ?Heel raise 15x 5" ?Squat 10x ?Stairs 7in 4RT BHR ?Prone:   ?knee flexion x 10 ?Hip extension with knee bent to 90 x 5                             ?     Contract relax to improve flexion x 5 ? Quad stretch 3x 30" ?Supine: heel slide 5x ? ?Manual:  decongestive techniques to Lt LE from hip to foot to decrease swelling, increase motion and decrease pain. ?  ? ? ?01/29/2022 ROM 0-110 ?Nustep seat 5 x 5 min arms 7 to improve ROM ?Standing:  ?Stairs B UE reciprocal x 4 RT  ?Heel raises x 10 ?Squats x 10  ?Slant board 5 x 20" ?12"  box knee flexion stretch 5 X 20" ?Prone:   ?     knee flexion x 10 ?     Hip extension with knee bent to 90 x 5                             ?    Contract relax to improve flexion x 3 ?Supine:  heel slides x5 ?  Bridge x 10  ? ? ?Manual:  decongestive techniques to Lt LE from hip to foot to decrease swelling, increase motion and decrease pain. ? ?  ? 01/24/2022 ?OBJECTIVE: ROM 2- 108 ?            Nustep seat at 5; hills 3; level 3 ?            Standing :  heel raises x 10 ?                              Knee driver for ROM 3 x 10" ?                              Terminal knee extension with ball behind knee x 10  ?           Prone:         knee flexion x 10 ?                               Hip extension with knee bent to 90 x 5 ?                               Contract relax to improve flexion x 3 ?           Supine:      quad set x 5 ?                             Heelslide x 10 ? ?            Manual:  decongestive techniques to Lt LE from hip to foot to decrease swelling, increase motion and decrease pain. ? ? ? ? .  ?01/18/22 ?Bike rocking with holds for stretch end range seat 9 x 4 min ?SPC 266f ?Rockerboard lateral then DF/PF ?Knee drive 109NAheight 5x 10" ?5 STS no HHA eccentric control 2 sets ? ?AROM -7--105 degrees ?Educated benefits of compression hose, measurements taken and handout given for ETI. ? ? ?01/14/22 ? ?Sitting Left knee flexion 96 degrees AROM, 102 degrees AAROM ?Sitting Left knee extension -10 AROM sitting -8 supine ?MMTs: Left knee extension 3/5; knee flexion 3-/5 ? ? ? ?01/11/22 ?Nustep seat 7 x 5 min, SPM average 35 ? Standing: ?  8 box knee flexion stretch 15X10" ?  Heel raise 20x, toeraises 20X ?  Slant board 5 x 20" ?  TKE GTB 10x 5" ?  Toe tapping 10x alternating 8in step height ? Supine: Quad sets 10x ?  SAQ 5x ?  Heel slides 10x  ? Seated: ?  LAQ  10X ?  STS 5X no UE assist ?AROM 0-100 degrees ?Manual:  retro and scar massage instruction ? ? ?01/09/22: ?Nustep seat 7 x 5 min, SPM average 35 ? Standing: ?  8 box knee flexion stretch 10X10" ?  Heel raise 20x, toeraises 20X ?  TKE GTB x 20 ?  Toe tapping 10x alternating 8in step height ?  Standing Left hip ABD x 10 ? ?Seated heel slides x 10; with mild  overpressure ? ?  // bar gait training with focus on improved heel strike and toe off and good posturing ?  AROM 0-102  degrees ? ? ?  01/07/22 ?Quad sets x 10 ?Ankle pumps x 10 ?Seated heel slides x 10 ?Seated heel/toe

## 2022-02-08 ENCOUNTER — Ambulatory Visit: Payer: Medicare Other | Admitting: Internal Medicine

## 2022-02-13 ENCOUNTER — Ambulatory Visit (HOSPITAL_COMMUNITY): Payer: Medicare Other | Admitting: Physical Therapy

## 2022-02-14 ENCOUNTER — Ambulatory Visit (HOSPITAL_COMMUNITY): Payer: Medicare Other | Attending: Orthopaedic Surgery | Admitting: Physical Therapy

## 2022-02-14 DIAGNOSIS — M25662 Stiffness of left knee, not elsewhere classified: Secondary | ICD-10-CM | POA: Insufficient documentation

## 2022-02-14 DIAGNOSIS — M25562 Pain in left knee: Secondary | ICD-10-CM | POA: Diagnosis present

## 2022-02-14 DIAGNOSIS — M6281 Muscle weakness (generalized): Secondary | ICD-10-CM | POA: Insufficient documentation

## 2022-02-14 DIAGNOSIS — R2689 Other abnormalities of gait and mobility: Secondary | ICD-10-CM | POA: Insufficient documentation

## 2022-02-14 DIAGNOSIS — R262 Difficulty in walking, not elsewhere classified: Secondary | ICD-10-CM | POA: Insufficient documentation

## 2022-02-14 NOTE — Therapy (Signed)
?OUTPATIENT PHYSICAL THERAPY PROGRESS NOTE ? ? ?Patient Name: Sarah Cooper ?MRN: [id number] ?DOB:07/14/1951, 71 y.o., female ?Today's Date: 02/14/2022 ? ?PCP: Haywood Pao, MD ?REFERRING PROVIDER: Melrose Nakayama, MD ? ? ? PT End of Session - 01/29/22 1962   ? ? Visit Number 14   ? Number of Visits 21   ? Date for PT Re-Evaluation 02/08/22   ? Authorization Type [employer]HC Medicare; copay 20.00   ? Authorization Time Period 10/14/21 to 10/13/22; no auth required follow MCR guidelines   ? Authorization - Visit Number 21   ? Progress Note Due on Visit 21   ? PT Start Time [phone]   ? PT Stop Time 9892  ? PT Time Calculation (min) 40 min   ? Equipment Utilized During Treatment Gait belt   ? Activity Tolerance Patient tolerated treatment well   ? Behavior During Therapy [facility] for tasks assessed/performed   ? ?  ?  ? ?  ? ? ? ? ? ?Past Medical History:  ?Diagnosis Date  ? Arthritis   ? back, hips, knees  ? Cancer [facility]) 1978  ? cervical  ? CHF (congestive heart failure) ([place])   ? Phreesia 11/28/2020  ? Hyperlipidemia 12/01/2020  ? Hypertension   ? Pneumothorax 1977  ? PONV (postoperative nausea and vomiting)   ? ?Past Surgical History:  ?Procedure Laterality Date  ? BREAST BIOPSY Left   ? approx @ 48 benign   ? collapsed lung on right     ? 1970s  ? HERNIA REPAIR    ? age 39  ? HIP SURGERY  2007  ? right  ? JOINT REPLACEMENT  2007  ? TOTAL HIP ARTHROPLASTY Left 07/25/2020  ? Procedure: LEFT TOTAL HIP ARTHROPLASTY ANTERIOR APPROACH;  Surgeon: Melrose Nakayama, MD;  [facility];  Service: Orthopedics;  Laterality: Left;  ? TOTAL KNEE ARTHROPLASTY Left 12/18/2021  ? Procedure: LEFT TOTAL KNEE ARTHROPLASTY;  Surgeon: Melrose Nakayama, MD;  [facility];  Service: Orthopedics;  Laterality: Left;  ? ?Patient Active Problem List  ? Diagnosis Date Noted  ? Stage 3a chronic kidney disease ([place]) 10/05/2021  ? Muscle spasm 02/06/2021  ? Insomnia, unspecified 12/01/2020  ? Hyperlipidemia 12/01/2020  ? Chronic diastolic heart failure  ([place]) 12/01/2020  ? Leg edema 12/01/2020  ? Primary localized osteoarthritis of left hip 07/25/2020  ? Tubular adenoma of colon 12/11/2016  ? Dyspnea 12/11/2016  ? Vitamin D deficiency 11/21/2016  ? Pre-diabetes 11/21/2016  ? Essential hypertension 11/19/2016  ? Depression, recurrent ([place]) 11/19/2016  ? Osteoarthritis 11/19/2016  ? Chronic back pain greater than 3 months duration 11/19/2016  ? History of cervical dysplasia 11/19/2016  ? Morbid obesity ([place]) 11/19/2016  ? ? ?Patient Name: Sarah Cooper ?MRN: [id number] ?DOB:03-10-1951, 71 y.o., female ?Today's Date: 12/21/21 ?  ?  ?  ?    ?Past Medical History:  ?Diagnosis Date  ? Arthritis    ?  back, hips, knees  ? Cancer [facility]) 1978  ?  cervical  ? CHF (congestive heart failure) ([place])    ?  Phreesia 11/28/2020  ? Hyperlipidemia 12/01/2020  ? Hypertension    ? Pneumothorax 1977  ? PONV (postoperative nausea and vomiting)    ?  ?     ?Past Surgical History:  ?Procedure Laterality Date  ? BREAST BIOPSY Left    ?  approx @ 48 benign   ? collapsed lung on right       ?  1970s  ? HERNIA REPAIR      ?  age 1  ? HIP SURGERY   2007  ?  right  ? JOINT REPLACEMENT   2007  ? TOTAL HIP ARTHROPLASTY Left 07/25/2020  ?  Procedure: LEFT TOTAL HIP ARTHROPLASTY ANTERIOR APPROACH;  Surgeon: Melrose Nakayama, MD;  [facility];  Service: Orthopedics;  Laterality: Left;  ? TOTAL KNEE ARTHROPLASTY Left 12/18/2021  ?  Procedure: LEFT TOTAL KNEE ARTHROPLASTY;  Surgeon: Melrose Nakayama, MD;  [facility];  Service: Orthopedics;  Laterality: Left;  ?  ?    ?Patient Active Problem List  ?  Diagnosis Date Noted  ? Stage 3a chronic kidney disease ([place]) 10/05/2021  ? Muscle spasm 02/06/2021  ? Insomnia, unspecified 12/01/2020  ? Hyperlipidemia 12/01/2020  ? Chronic diastolic heart failure ([place]) 12/01/2020  ? Leg edema 12/01/2020  ? Primary localized osteoarthritis of left hip 07/25/2020  ? Tubular adenoma of colon 12/11/2016  ? Dyspnea 12/11/2016  ? Vitamin D deficiency 11/21/2016  ?  Pre-diabetes 11/21/2016  ? Essential hypertension 11/19/2016  ? Depression, recurrent ([place]) 11/19/2016  ? Osteoarthritis 11/19/2016  ? Chronic back pain greater than 3 months duration 11/19/2016  ? History of cervical dysplasia 11/19/2016  ? Morbid obesity ([place]) 11/19/2016 ?  ? ? ?SUBJECTIVE: Pt stated she has been walking at home without AD.  No real pain, just some burning over incision. ?PAIN:  ?Are you having pain? Yes: NPRS scale: 0/10 ?Pain location: Left knee ?Pain description: sore, tender, stiff ?Aggravating factors: movement ?Relieving factors: ice, medication ? Pt goals:  walk with SPC in 2 weeks to enable ability to drive.   ?  ?PCP: Haywood Pao, MD ?  ?REFERRING PROVIDER: Melrose Nakayama, MD ?  ?REFERRING DIAG: PT EVAL/TX S/P L TOTAL KNEE REPL PER PETER DALLDORF, MD ?DOS ON 12/18/21 PLEASE [facility] 12/21/21 ?  ?THERAPY DIAG:  ?OA L knee ?  ?ONSET DATE: s/p 12/18/21 ?  ?PERTINENT HISTORY: ?Right Hip THA 2007, Left hip THA October 2021, still has some soreness L hip ?  ?PRECAUTIONS: Fall ?  ?WEIGHT BEARING RESTRICTIONS No ? ? ?OBJECTIVE:  ? ?TODAY'S TREATMENT: ? ?02/14/22 ?Bike seat 9 rocking 4 minutes ?Standing  knee flexion onto 12" step 10X10" holds ? Lt knee flexion 10X ? Heelraises 10X on incline ? Tandem stance intermittent HHA each LE lead 30" X2 ? SLS mas of 5 trials Lt: 15" max, Rt: 6" max ? Forward Step up 4" 10X each  ? Lateral step up 4" 10X each ?Supine heelslide ? AROM:  0-110 ?Manual : retro massage with elevation ? ? ?02/05/22 0-114 ?2MWT no AD 363f ?Bike seat 9 able to go backwards x 455m ? ?Knee drive 5x 10" 2nd step ?Heel raise 15x 5" ?Squat 10x ?Stairs 7in 4RT BHR ?Prone:   ?knee flexion x 10 ?Hip extension with knee bent to 90 x 5                             ?     Contract relax to improve flexion x 5 ? Quad stretch 3x 30" ?Supine: heel slide 5x ? ?Manual:  decongestive techniques to Lt LE from hip to foot to decrease swelling, increase motion and decrease pain. ?  ? ?01/29/2022  ?ROM  0-110 ?Nustep seat 5 x 5 min arms 7 to improve ROM ?Standing:  ?Stairs B UE reciprocal x 4 RT  ?Heel raises x 10 ?Squats x 10  ?Slant board 5 x 20" ?12"  box knee flexion stretch  5 X 20" ?Prone:   ?     knee flexion x 10 ?     Hip extension with knee bent to 90 x 5                             ?    Contract relax to improve flexion x 3 ?Supine:  heel slides x5 ?              Bridge x 10  ? ? ?Manual:  decongestive techniques to Lt LE from hip to foot to decrease swelling, increase motion and decrease pain. ?  ? ?01/24/2022: ?ROM 2- 108 ?            Nustep seat at 5; hills 3; level 3 ?            Standing :  heel raises x 10 ?                              Knee driver for ROM 3 x 10" ?                              Terminal knee extension with ball behind knee x 10  ?           Prone:         knee flexion x 10 ?                               Hip extension with knee bent to 90 x 5 ?                               Contract relax to improve flexion x 3 ?           Supine:      quad set x 5 ?                             Heelslide x 10 ? ?            Manual:  decongestive techniques to Lt LE from hip to foot to decrease swelling, increase motion and decrease pain. ? ? ? ? ? ?01/14/22 OBJECTIVE MEASURES ? ?Sitting Left knee flexion 96 degrees AROM, 102 degrees AAROM ?Sitting Left knee extension -10 AROM sitting -8 supine ?MMTs: Left knee extension 3/5; knee flexion 3-/5 ? ? ? ?  ?PATIENT EDUCATION:  ?Education details: edema and scar massage, RICE principle ?Person educated: Patient ?Education method: Explanation, Demonstration ?Education comprehension: verbalized understanding, returned demonstration, and needs further education ?  ?  ?HOME EXERCISE PROGRAM: ?Access Code: [email or web address] ?URL: [email or web address] ?Date: 12/21/2021 ?Prepared by: [facility] ?  ?Exercises ?Seated Heel Slide - 1 x daily - 7 x weekly - 1 sets - 10 reps ?Supine Quad Set - 1 x daily - 7 x weekly - 1 sets - 10 reps ?Supine Gluteal Sets - 1 x  daily - 7 x weekly - 1 sets - 10 reps ?Supine Ankle Pumps - 1 x daily - 7 x weekly - 2 sets - 10 reps ?Supine Heel Slide with Strap - 1 x daily - 7 x weekly - 1 sets - 10  reps ? ?Access Code: [email or web address] ?UR

## 2022-02-15 ENCOUNTER — Ambulatory Visit
Admission: RE | Admit: 2022-02-15 | Discharge: 2022-02-15 | Disposition: A | Discharge status: Home / Self Care | Payer: Medicare Other | Source: Ambulatory Visit | Attending: Internal Medicine | Admitting: Internal Medicine

## 2022-02-15 DIAGNOSIS — Z1231 Encounter for screening mammogram for malignant neoplasm of breast: Secondary | ICD-10-CM

## 2022-02-19 ENCOUNTER — Ambulatory Visit (HOSPITAL_COMMUNITY): Payer: Medicare Other

## 2022-02-19 DIAGNOSIS — M25662 Stiffness of left knee, not elsewhere classified: Secondary | ICD-10-CM

## 2022-02-19 DIAGNOSIS — R262 Difficulty in walking, not elsewhere classified: Secondary | ICD-10-CM

## 2022-02-19 DIAGNOSIS — M25562 Pain in left knee: Secondary | ICD-10-CM

## 2022-02-19 DIAGNOSIS — M6281 Muscle weakness (generalized): Secondary | ICD-10-CM

## 2022-02-19 DIAGNOSIS — R2689 Other abnormalities of gait and mobility: Secondary | ICD-10-CM

## 2022-02-19 NOTE — Therapy (Signed)
OUTPATIENT PHYSICAL THERAPY DISCHARGE NOTE  Progress Note Reporting Period 12/21/21  to 02/19/22  See note below for Objective Data and Assessment of Progress/Goals.   PHYSICAL THERAPY DISCHARGE SUMMARY  Visits from Start of Care: 12/21/21  Current functional level related to goals / functional outcomes: See below   Remaining deficits: See below   Education / Equipment: See below   Patient agrees to discharge. Patient goals were met. Patient is being discharged due to meeting the stated rehab goals.       Patient Name: Sarah Cooper MRN: [id number] DOB:August 17, 1951, 70 y.o., female Today's Date: 02/19/2022  PCP: Gaspar Garbe, MD REFERRING PROVIDER: Marcene Corning, MD    PT End of Session - 01/29/22 0838     Visit Number 14    Number of Visits 21    Date for PT Re-Evaluation 02/08/22    Authorization Type U[employer] Medicare; copay 20.00    Authorization Time Period 10/14/21 to 10/13/22; no auth required follow MCR guidelines    Authorization - Visit Number 21    Progress Note Due on Visit 21    PT Start Time 0833    PT Stop Time 0913   PT Time Calculation (min) 40 min    Equipment Utilized During Treatment Gait belt    Activity Tolerance Patient tolerated treatment well    Behavior During Therapy WFL for tasks assessed/performed                Past Medical History:  Diagnosis Date   Arthritis    back, hips, knees   Cancer (HCC) 1978   cervical   CHF (congestive heart failure) (HCC)    Phreesia 11/28/2020   Hyperlipidemia 12/01/2020   Hypertension    Pneumothorax 1977   PONV (postoperative nausea and vomiting)    Past Surgical History:  Procedure Laterality Date   BREAST BIOPSY Left    approx @ 48 benign    collapsed lung on right      1970s   HERNIA REPAIR     age 23   HIP SURGERY  2007   right   JOINT REPLACEMENT  2007   TOTAL HIP ARTHROPLASTY Left 07/25/2020   Procedure: LEFT TOTAL HIP ARTHROPLASTY ANTERIOR APPROACH;  Surgeon:  Marcene Corning, MD;  Location: [facility];  Service: Orthopedics;  Laterality: Left;   TOTAL KNEE ARTHROPLASTY Left 12/18/2021   Procedure: LEFT TOTAL KNEE ARTHROPLASTY;  Surgeon: Marcene Corning, MD;  Location: [facility];  Service: Orthopedics;  Laterality: Left;   Patient Active Problem List   Diagnosis Date Noted   Stage 3a chronic kidney disease (HCC) 10/05/2021   Muscle spasm 02/06/2021   Insomnia, unspecified 12/01/2020   Hyperlipidemia 12/01/2020   Chronic diastolic heart failure (HCC) 12/01/2020   Leg edema 12/01/2020   Primary localized osteoarthritis of left hip 07/25/2020   Tubular adenoma of colon 12/11/2016   Dyspnea 12/11/2016   Vitamin D deficiency 11/21/2016   Pre-diabetes 11/21/2016   Essential hypertension 11/19/2016   Depression, recurrent (HCC) 11/19/2016   Osteoarthritis 11/19/2016   Chronic back pain greater than 3 months duration 11/19/2016   History of cervical dysplasia 11/19/2016   Morbid obesity (HCC) 11/19/2016    Patient Name: Sarah Cooper MRN: [id number] DOB:09-15-1951, 71 y.o., female Today's Date: 12/21/21           Past Medical History:  Diagnosis Date   Arthritis      back, hips, knees   Cancer (HCC) 1978    cervical   CHF (  congestive heart failure) (HCC)      Phreesia 11/28/2020   Hyperlipidemia 12/01/2020   Hypertension     Pneumothorax 1977   PONV (postoperative nausea and vomiting)           Past Surgical History:  Procedure Laterality Date   BREAST BIOPSY Left      approx @ 48 benign    collapsed lung on right         1970s   HERNIA REPAIR        age 37   HIP SURGERY   2007    right   JOINT REPLACEMENT   2007   TOTAL HIP ARTHROPLASTY Left 07/25/2020    Procedure: LEFT TOTAL HIP ARTHROPLASTY ANTERIOR APPROACH;  Surgeon: Marcene Corning, MD;  Location: [facility];  Service: Orthopedics;  Laterality: Left;   TOTAL KNEE ARTHROPLASTY Left 12/18/2021    Procedure: LEFT TOTAL KNEE ARTHROPLASTY;  Surgeon: Marcene Corning, MD;   Location: [facility];  Service: Orthopedics;  Laterality: Left;        Patient Active Problem List    Diagnosis Date Noted   Stage 3a chronic kidney disease (HCC) 10/05/2021   Muscle spasm 02/06/2021   Insomnia, unspecified 12/01/2020   Hyperlipidemia 12/01/2020   Chronic diastolic heart failure (HCC) 12/01/2020   Leg edema 12/01/2020   Primary localized osteoarthritis of left hip 07/25/2020   Tubular adenoma of colon 12/11/2016   Dyspnea 12/11/2016   Vitamin D deficiency 11/21/2016   Pre-diabetes 11/21/2016   Essential hypertension 11/19/2016   Depression, recurrent (HCC) 11/19/2016   Osteoarthritis 11/19/2016   Chronic back pain greater than 3 months duration 11/19/2016   History of cervical dysplasia 11/19/2016   Morbid obesity (HCC) 11/19/2016     SUBJECTIVE: about 75% better; some occasional swelling continues; some burning in lower leg only but overall much better.  PAIN:  Are you having pain? Yes: NPRS scale: 0/10 Pain location: Left knee Pain description: sore, tender, stiff Aggravating factors: movement Relieving factors: ice, medication  Pt goals:  walk with SPC in 2 weeks to enable ability to drive.     PCP: Tisovec, Adelfa Koh, MD   REFERRING PROVIDER: Marcene Corning, MD   REFERRING DIAG: PT EVAL/TX S/P L TOTAL KNEE REPL PER PETER DALLDORF, MD DOS ON 12/18/21 PLEASE [facility] 12/21/21   THERAPY DIAG:  OA L knee   ONSET DATE: s/p 12/18/21   PERTINENT HISTORY: Right Hip THA 2007, Left hip THA October 2021, still has some soreness L hip   PRECAUTIONS: Fall   WEIGHT BEARING RESTRICTIONS No   OBJECTIVE:   TODAY'S TREATMENT: 02/19/22 Bike seat 10; 5 min; able to make a full revolution Review of HEP  Progress note: AROM Left knee: 0-116 MMT's 5/5 30 sec STS x 10 today FOTO 63     02/14/22 Bike seat 9 rocking 4 minutes Standing  knee flexion onto 12" step 10X10" holds  Lt knee flexion 10X  Heelraises 10X on incline  Tandem stance intermittent HHA each LE  lead 30" X2  SLS mas of 5 trials Lt: 15" max, Rt: 6" max  Forward Step up 4" 10X each   Lateral step up 4" 10X each Supine heelslide  AROM:  0-110 Manual : retro massage with elevation   02/05/22 0-114 no AD 313ft Bike seat 9 able to go backwards x  Knee drive 5x 10" 2nd step Heel raise 15x 5" Squat 10x Stairs 7in 4RT BHR Prone:   knee flexion x 10 Hip extension  with knee bent to 90 x 5                                  Contract relax to improve flexion x 5  Quad stretch 3x 30" Supine: heel slide 5x  Manual:  decongestive techniques to Lt LE from hip to foot to decrease swelling, increase motion and decrease pain.    01/29/2022  ROM 0-110 Nustep seat 5 x 5 min arms 7 to improve ROM Standing:  Stairs B UE reciprocal x 4 RT  Heel raises x 10 Squats x 10  Slant board 5 x 20" 12"  box knee flexion stretch 5 X 20" Prone:        knee flexion x 10      Hip extension with knee bent to 90 x 5                                 Contract relax to improve flexion x 3 Supine:  heel slides x5               Bridge x 10    Manual:  decongestive techniques to Lt LE from hip to foot to decrease swelling, increase motion and decrease pain.    01/24/2022: ROM 2- 108             Nustep seat at 5; hills 3; level 3             Standing :  heel raises x 10                               Knee driver for ROM 3 x 10"                               Terminal knee extension with ball behind knee x 10             Prone:         knee flexion x 10                                Hip extension with knee bent to 90 x 5                                Contract relax to improve flexion x 3            Supine:      quad set x 5                              Heelslide x 10              Manual:  decongestive techniques to Lt LE from hip to foot to decrease swelling, increase motion and decrease pain.      01/14/22 OBJECTIVE MEASURES  Sitting Left knee flexion 96 degrees AROM, 102 degrees  AAROM Sitting Left knee extension -10 AROM sitting -8 supine MMTs: Left knee extension 3/5; knee flexion 3-/5      PATIENT EDUCATION:  Education details: edema and scar massage, RICE principle Person educated:  Patient Education method: Explanation, Demonstration Education comprehension: verbalized understanding, returned demonstration, and needs further education     HOME EXERCISE PROGRAM: Access Code: [email or web address] URL: [email or web address] Date: 12/21/2021 Prepared by: [facility]   Exercises Seated Heel Slide - 1 x daily - 7 x weekly - 1 sets - 10 reps Supine Quad Set - 1 x daily - 7 x weekly - 1 sets - 10 reps Supine Gluteal Sets - 1 x daily - 7 x weekly - 1 sets - 10 reps Supine Ankle Pumps - 1 x daily - 7 x weekly - 2 sets - 10 reps Supine Heel Slide with Strap - 1 x daily - 7 x weekly - 1 sets - 10 reps  Access Code: [email or web address] URL: [email or web address] Date: 12/26/2021 Prepared by: [facility] - [facility]  Exercises Seated Heel Toe Raises - 2-3 x daily - 7 x weekly - 1 sets - 10 reps Supine Hip Abduction - 2-3 x daily - 7 x weekly - 1 sets - 10 reps Seated Long Arc Quad - 2-3 x daily - 7 x weekly - 1 sets - 10 reps Seated Knee Flexion AAROM - 2-3 x daily - 7 x weekly - 1 sets - 10 reps  3/24: standing marching with UE support and heel raises, increase quad/glut sets to 100 a day (not all at once).     ASSESSMENT:   CLINICAL IMPRESSION: Today's session we reviewed patient's HEP and goals; patient has met all set rehab goals at this time so discharge to independent HEP; patient in agreement with discharge plan. .      OBJECTIVE IMPAIRMENTS Abnormal gait, decreased activity tolerance, decreased balance, decreased endurance, decreased mobility, difficulty walking, decreased ROM, decreased strength, decreased safety awareness, increased edema, impaired perceived functional ability, increased muscle spasms, impaired flexibility, and pain.         GOALS: Goals reviewed with patient? Yes   SHORT TERM GOALS:   Patient will be independent in initial HEP to improve functional outcomes.   Target date: 01/04/2022 Goal status: Met  2.  Patient will improve Left  knee AAROM flexion in sitting to 90 degrees to improve functional mobility in home Baseline: 62 Target date: 01/04/2022 Goal status: Met   LONG TERM GOALS:   Long term goal: Patient will be independent with advanced HEP and self management strategies to improve quality of life and functional outcomes.   Target date: 02/10/2022 Goal status:  met  2.  Patient will meet predicted FOTO score to demonstrate improved overall function.   Baseline: 21 Target date: 02/10/2022 Goal status: met; score 63   3.  Patient will report at least 50% improvement in overall symptoms and/or function to demonstrate improved functional mobility   Target date: 02/10/2022; 60-75% improvement Goal status: met   4.  Long term goal: Patient will demonstrate improved Left knee AROM to -5 to 110  to be able to safely navigate community surfaces.    Baseline: Left knee ext -15, Left knee flex 62 Target date: 02/10/2022; today 02/19/22 0-116 Goal status: Met   5.  Long term goal: Patient will increase Left knee MMTs to 5/5 to promote return to ambulation community distances with minimal deviation   Target date: 02/10/2022, 02/19/22 5/5 KF and KE met Goal status:  met  6.  Patient will improve 30 sec STS score from 2  to 5  to demonstrate improved functional mobility and increased lower extremity strength.   Baseline: 2; 02/19/22 met 10 STS x in 30  sec Target date: 02/10/2022 Goal status: met  PLAN: PT FREQUENCY: 3x/week   PT DURATION: 4 weeks   PLANNED INTERVENTIONS: Therapeutic exercises, Therapeutic activity, Neuromuscular re-education, Balance training, Gait training, Patient/Family education, Joint manipulation, Joint mobilization, Stair training, DME instructions, Aquatic Therapy, Electrical  stimulation, Cryotherapy, scar mobilization, Taping, and Manual therapy   PLAN FOR NEXT SESSION: discharge   8:45 AM, 02/19/22 Rosalene Wardrop Small Darean Rote MPT [facility] [place] [phone]

## 2022-02-21 ENCOUNTER — Encounter (HOSPITAL_COMMUNITY): Payer: Medicare Other

## 2022-02-25 ENCOUNTER — Encounter (HOSPITAL_COMMUNITY): Payer: Medicare Other | Admitting: Physical Therapy

## 2022-02-27 ENCOUNTER — Encounter (HOSPITAL_COMMUNITY): Payer: Medicare Other

## 2022-03-02 ENCOUNTER — Other Ambulatory Visit: Payer: Self-pay | Admitting: Cardiology

## 2022-03-02 DIAGNOSIS — I10 Essential (primary) hypertension: Secondary | ICD-10-CM

## 2022-03-04 ENCOUNTER — Encounter (HOSPITAL_COMMUNITY): Payer: Medicare Other

## 2022-03-06 ENCOUNTER — Encounter (HOSPITAL_COMMUNITY): Payer: Medicare Other

## 2022-09-04 ENCOUNTER — Telehealth: Payer: Self-pay | Admitting: Cardiology

## 2022-09-04 ENCOUNTER — Encounter: Payer: Self-pay | Admitting: Cardiology

## 2022-09-10 NOTE — Telephone Encounter (Signed)
Error

## 2022-11-15 ENCOUNTER — Encounter (HOSPITAL_COMMUNITY): Payer: Self-pay | Admitting: Occupational Therapy

## 2022-11-15 ENCOUNTER — Ambulatory Visit (HOSPITAL_COMMUNITY): Payer: Medicare Other | Attending: Internal Medicine | Admitting: Occupational Therapy

## 2022-11-15 DIAGNOSIS — R29898 Other symptoms and signs involving the musculoskeletal system: Secondary | ICD-10-CM | POA: Insufficient documentation

## 2022-11-15 DIAGNOSIS — M25511 Pain in right shoulder: Secondary | ICD-10-CM | POA: Insufficient documentation

## 2022-11-15 DIAGNOSIS — M25611 Stiffness of right shoulder, not elsewhere classified: Secondary | ICD-10-CM | POA: Insufficient documentation

## 2022-11-15 NOTE — Therapy (Signed)
OUTPATIENT OCCUPATIONAL THERAPY ORTHO EVALUATION  Patient Name: Sarah Cooper MRN: [id number] DOB:1951-04-04, 72 y.o., female Today's Date: 11/16/2022  PCP: Haywood Pao, MD REFERRING PROVIDER: Hessie Dibble., MD  END OF SESSION:   11/15/22 0732  OT Visits / Re-Eval  Visit Number 1  Number of Visits 9  Date for OT Re-Evaluation 12/13/22  Authorization  Authorization Type [employer] Medicare  Progress Note Due on Visit 10  OT Time Calculation  OT Start Time 0730  OT Stop Time 0815  OT Time Calculation (min) 45 min  End of Session  Activity Tolerance Patient tolerated treatment well  Behavior During Therapy WFL for tasks assessed/performed     Past Medical History:  Diagnosis Date   Arthritis    back, hips, knees   Cancer ([place]) 1978   cervical   CHF (congestive heart failure) ([place])    Phreesia 11/28/2020   Hyperlipidemia 12/01/2020   Hypertension    Pneumothorax 1977   PONV (postoperative nausea and vomiting)    Past Surgical History:  Procedure Laterality Date   BREAST BIOPSY Left    approx @ 48 benign    collapsed lung on right      [place]     age 2   HIP SURGERY  2007   right   JOINT REPLACEMENT  2007   TOTAL HIP ARTHROPLASTY Left 07/25/2020   Procedure: LEFT TOTAL HIP ARTHROPLASTY ANTERIOR APPROACH;  Surgeon: Melrose Nakayama, MD;  Location: [facility];  Service: Orthopedics;  Laterality: Left;   TOTAL KNEE ARTHROPLASTY Left 12/18/2021   Procedure: LEFT TOTAL KNEE ARTHROPLASTY;  Surgeon: Melrose Nakayama, MD;  Location: [facility];  Service: Orthopedics;  Laterality: Left;   Patient Active Problem List   Diagnosis Date Noted   Stage 3a chronic kidney disease ([place]) 10/05/2021   Muscle spasm 02/06/2021   Insomnia, unspecified 12/01/2020   Hyperlipidemia 12/01/2020   Chronic diastolic heart failure ([place]) 12/01/2020   Leg edema 12/01/2020   Primary localized osteoarthritis of left hip 07/25/2020   Tubular adenoma of colon 12/11/2016    Dyspnea 12/11/2016   Vitamin D deficiency 11/21/2016   Pre-diabetes 11/21/2016   Essential hypertension 11/19/2016   Depression, recurrent ([place]) 11/19/2016   Osteoarthritis 11/19/2016   Chronic back pain greater than 3 months duration 11/19/2016   History of cervical dysplasia 11/19/2016   Morbid obesity ([place]) 11/19/2016    ONSET DATE: 09/20/23  REFERRING DIAG: R Rotator Cuff Tear  THERAPY DIAG:  Acute pain of right shoulder  Shoulder stiffness, right  Other symptoms and signs involving the musculoskeletal system  Rationale for Evaluation and Treatment: Rehabilitation  SUBJECTIVE:   SUBJECTIVE STATEMENT: "I'm not sure what the doctor did to my shoulder, he just said it was worse than he thought" Pt accompanied by: self  PERTINENT HISTORY: Pt presents to OT s/p R rotator cuff scope due to R rotator cuff tear  PRECAUTIONS: Shoulder  WEIGHT BEARING RESTRICTIONS: No  PAIN:  Are you having pain? Yes: NPRS scale: 6/10 Pain location: Deltoid area Pain description: clicking and sharp Aggravating factors: movement Relieving factors: Ice, medication  FALLS: Has patient fallen in last 6 months? No  LIVING ENVIRONMENT: Lives with: lives alone Lives in: House/apartment  PLOF: Independent  PATIENT GOALS: To relieve pain and get my shoulder moving better  NEXT MD VISIT: 11/27/22  OBJECTIVE:   HAND DOMINANCE: Right  ADLs: Overall ADLs: Pt reports increased difficulty with bathing her UB and dressing, Unable to clean the house at  this time and difficulty with holding up pots and pans.  FUNCTIONAL OUTCOME MEASURES: FOTO: 40.70  UPPER EXTREMITY ROM:     Active ROM Right eval  Shoulder flexion 57  Shoulder abduction 53  Shoulder internal rotation 90  Shoulder external rotation 47  (Blank rows = not tested)   UPPER EXTREMITY MMT:     MMT Right eval  Shoulder flexion 3+/5  Shoulder abduction 4-/5  Shoulder adduction 4/5  Shoulder extension 4/5  Shoulder  internal rotation 4/5  Shoulder external rotation 4-/5  Elbow flexion 5/5  (Blank rows = not tested)  HAND FUNCTION: Grip strength: Right: 50 lbs; Left: 50 lbs  SENSATION: WFL  EDEMA: Pt reports swelling in the bicep region of RUE  OBSERVATIONS: Severe fascial restrictions in the biceps and trapezius   TODAY'S TREATMENT:                                                                                                                              DATE: 11/15/22: Evaluation    PATIENT EDUCATION: Education details: Pendulums and Table Slides Person educated: Patient Education method: Explanation, Demonstration, and Handouts Education comprehension: verbalized understanding and returned demonstration  HOME EXERCISE PROGRAM: Pendulums and Table Slides  GOALS: Goals reviewed with patient? Yes  SHORT TERM GOALS: Target date: 12/13/22  Pt was provided and educated on a comprehensive HEP for RUE mobility during ADL completion.  Goal status: INITIAL  2.  Pt will decrease pain to 3/10 in RUE in order to sleep for 3+ consecutive hours without waking due to pain.  Goal status: INITIAL  3.  Pt will decrease fascial restrictions to minimal amounts or less in the RUE in order to improve overhead reaching.  Goal status: INITIAL  4.  Pt will improve A/ROM in RUE to [facility] in order to improve overhead and behind the back reaching during dressing and bathing tasks.  Goal status: INITIAL  5.  Pt will improve strength in RUE to 4+/5 in order to improve ability to lift during cooking and cleaning tasks.  Goal status: INITIAL    ASSESSMENT:  CLINICAL IMPRESSION: Patient is a 72 y.o. female who was seen today for occupational therapy evaluation for severe R rotator cuff tear, s/p shoulder scoping. Pt presents with severe limitations in strength and ROM, which are limiting her ability to complete ADL's and IADL's.   PERFORMANCE DEFICITS: in functional skills including ADLs, IADLs, ROM, strength,  pain, fascial restrictions, Gross motor control, body mechanics, and UE functional use.  IMPAIRMENTS: are limiting patient from ADLs, IADLs, rest and sleep, leisure, and social participation.   COMORBIDITIES: has no other co-morbidities that affects occupational performance. Patient will benefit from skilled OT to address above impairments and improve overall function.  MODIFICATION OR ASSISTANCE TO COMPLETE EVALUATION: No modification of tasks or assist necessary to complete an evaluation.  OT OCCUPATIONAL PROFILE AND HISTORY: Problem focused assessment: Including review of records relating to presenting problem.  CLINICAL DECISION  MAKING: LOW - limited treatment options, no task modification necessary  REHAB POTENTIAL: Good  EVALUATION COMPLEXITY: Low      PLAN:  OT FREQUENCY: 2x/week  OT DURATION: 4 weeks  PLANNED INTERVENTIONS: self care/ADL training, therapeutic exercise, therapeutic activity, manual therapy, passive range of motion, functional mobility training, electrical stimulation, ultrasound, moist heat, cryotherapy, patient/family education, energy conservation, coping strategies training, and DME and/or AE instructions  RECOMMENDED OTHER SERVICES: N/A  CONSULTED AND AGREED WITH PLAN OF CARE: Patient  PLAN FOR NEXT SESSION: Manual Therapy, P/ROM, AA/ROM, [place], OTR/L [facility] [place], [place] 11/16/2022, 4:48 PM

## 2022-11-19 ENCOUNTER — Ambulatory Visit (HOSPITAL_COMMUNITY): Payer: Medicare Other | Admitting: Occupational Therapy

## 2022-11-19 ENCOUNTER — Encounter (HOSPITAL_COMMUNITY): Payer: Self-pay | Admitting: Occupational Therapy

## 2022-11-19 DIAGNOSIS — M25511 Pain in right shoulder: Secondary | ICD-10-CM | POA: Diagnosis not present

## 2022-11-19 DIAGNOSIS — M25611 Stiffness of right shoulder, not elsewhere classified: Secondary | ICD-10-CM

## 2022-11-19 DIAGNOSIS — R29898 Other symptoms and signs involving the musculoskeletal system: Secondary | ICD-10-CM

## 2022-11-19 NOTE — Therapy (Signed)
[place] EVALUATION  Patient Name: Sarah Cooper MRN: [id number] DOB:1951-03-21, 72 y.o., female Today's Date: 11/19/2022  PCP: Haywood Pao, MD REFERRING PROVIDER: Hessie Dibble., MD  END OF SESSION:  OT End of Session - 11/19/22 1117     Visit Number 2    Number of Visits 9    Date for OT Re-Evaluation 12/13/22    Authorization Type [employer] Medicare    Progress Note Due on Visit 10    OT Start Time 1120    OT Stop Time 1200    OT Time Calculation (min) 40 min    Activity Tolerance Patient tolerated treatment well    Behavior During Therapy WFL for tasks assessed/performed             Past Medical History:  Diagnosis Date   Arthritis    back, hips, knees   Cancer ([place]) 1978   cervical   CHF (congestive heart failure) ([place])    Phreesia 11/28/2020   Hyperlipidemia 12/01/2020   Hypertension    Pneumothorax 1977   PONV (postoperative nausea and vomiting)    Past Surgical History:  Procedure Laterality Date   BREAST BIOPSY Left    approx @ 48 benign    collapsed lung on right      [place]     age 59   HIP SURGERY  2007   right   JOINT REPLACEMENT  2007   TOTAL HIP ARTHROPLASTY Left 07/25/2020   Procedure: LEFT TOTAL HIP ARTHROPLASTY ANTERIOR APPROACH;  Surgeon: Melrose Nakayama, MD;  Location: [facility];  Service: Orthopedics;  Laterality: Left;   TOTAL KNEE ARTHROPLASTY Left 12/18/2021   Procedure: LEFT TOTAL KNEE ARTHROPLASTY;  Surgeon: Melrose Nakayama, MD;  Location: [facility];  Service: Orthopedics;  Laterality: Left;   Patient Active Problem List   Diagnosis Date Noted   Stage 3a chronic kidney disease ([place]) 10/05/2021   Muscle spasm 02/06/2021   Insomnia, unspecified 12/01/2020   Hyperlipidemia 12/01/2020   Chronic diastolic heart failure ([place]) 12/01/2020   Leg edema 12/01/2020   Primary localized osteoarthritis of left hip 07/25/2020   Tubular adenoma of colon 12/11/2016   Dyspnea 12/11/2016   Vitamin  D deficiency 11/21/2016   Pre-diabetes 11/21/2016   Essential hypertension 11/19/2016   Depression, recurrent ([place]) 11/19/2016   Osteoarthritis 11/19/2016   Chronic back pain greater than 3 months duration 11/19/2016   History of cervical dysplasia 11/19/2016   Morbid obesity ([place]) 11/19/2016    ONSET DATE: 09/20/23  REFERRING DIAG: R Rotator Cuff Tear  THERAPY DIAG:  Acute pain of right shoulder  Shoulder stiffness, right  Other symptoms and signs involving the musculoskeletal system  Rationale for Evaluation and Treatment: Rehabilitation  SUBJECTIVE:   SUBJECTIVE STATEMENT: "I can't sleep well right now." Pt accompanied by: self  PERTINENT HISTORY: Pt presents to OT s/p R rotator cuff scope due to R rotator cuff tear  PRECAUTIONS: Shoulder  WEIGHT BEARING RESTRICTIONS: No  PAIN:  Are you having pain? Yes: NPRS scale: 6/10 Pain location: Deltoid area Pain description: clicking and sharp Aggravating factors: movement Relieving factors: Ice, medication  FALLS: Has patient fallen in last 6 months? No  PATIENT GOALS: To relieve pain and get my shoulder moving better  NEXT MD VISIT: 11/27/22  OBJECTIVE:   HAND DOMINANCE: Right  ADLs: Overall ADLs: Pt reports increased difficulty with bathing her UB and dressing, Unable to clean the house at this time and difficulty with holding up pots  and pans.  FUNCTIONAL OUTCOME MEASURES: FOTO: 40.70  UPPER EXTREMITY ROM:     Active ROM Right eval  Shoulder flexion 57  Shoulder abduction 53  Shoulder internal rotation 90  Shoulder external rotation 47  (Blank rows = not tested)   UPPER EXTREMITY MMT:     MMT Right eval  Shoulder flexion 3+/5  Shoulder abduction 4-/5  Shoulder adduction 4/5  Shoulder extension 4/5  Shoulder internal rotation 4/5  Shoulder external rotation 4-/5  Elbow flexion 5/5  (Blank rows = not tested)  HAND FUNCTION: Grip strength: Right: 50 lbs; Left: 50  lbs  SENSATION: WFL  EDEMA: Pt reports swelling in the bicep region of RUE  OBSERVATIONS: Severe fascial restrictions in the biceps and trapezius   TODAY'S TREATMENT:                                                                                                                              DATE:   11/19/22 -Manual Therapy: myofascial release and trigger point applied to biceps, trapezius, and subscapularis in order to address pain and fascial restrictions to improve ROM.  -P/ROM: supine, flexion, abduction, er/IR, x10 -Ball Rolls: flexion and abduction x10 each -Pulleys: flexion and abduction x60" each    PATIENT EDUCATION: Education details: Willeen Niece Person educated: Patient Education method: Explanation, Demonstration, and Handouts Education comprehension: verbalized understanding and returned demonstration  HOME EXERCISE PROGRAM: 2/2: Pendulums and Table Slides 2/6: Willeen Niece  GOALS: Goals reviewed with patient? Yes  SHORT TERM GOALS: Target date: 12/13/22  Pt was provided and educated on a comprehensive HEP for RUE mobility during ADL completion.  Goal status: IN PROGRESS  2.  Pt will decrease pain to 3/10 in RUE in order to sleep for 3+ consecutive hours without waking due to pain.  Goal status: IN PROGRESS  3.  Pt will decrease fascial restrictions to minimal amounts or less in the RUE in order to improve overhead reaching.  Goal status: IN PROGRESS  4.  Pt will improve A/ROM in RUE to [facility] in order to improve overhead and behind the back reaching during dressing and bathing tasks.  Goal status: IN PROGRESS  5.  Pt will improve strength in RUE to 4+/5 in order to improve ability to lift during cooking and cleaning tasks.  Goal status: IN PROGRESS    ASSESSMENT:  CLINICAL IMPRESSION: Pt presents to session with minimal pain at rest, however with all exercises pt reports pain substantially increasing. She has more pain as the arm returns down by her side,  compared to being brought up. At this time, pt requiring increased time and only able to reach approximately 60% of full ROM. OT providing verbal and tactile cuing for positioning and technique.    PERFORMANCE DEFICITS: in functional skills including ADLs, IADLs, ROM, strength, pain, fascial restrictions, Gross motor control, body mechanics, and UE functional use.    PLAN:  OT FREQUENCY: 2x/week  OT DURATION: 4 weeks  PLANNED INTERVENTIONS: self care/ADL training, therapeutic exercise, therapeutic activity, manual therapy, passive range of motion, functional mobility training, electrical stimulation, ultrasound, moist heat, cryotherapy, patient/family education, energy conservation, coping strategies training, and DME and/or AE instructions  RECOMMENDED OTHER SERVICES: N/A  CONSULTED AND AGREED WITH PLAN OF CARE: Patient  PLAN FOR NEXT SESSION: Manual Therapy, P/ROM, AA/ROM, [place], OTR/L [facility] [place], [place] 11/19/2022, 11:18 AM

## 2022-11-19 NOTE — Patient Instructions (Signed)

## 2022-11-22 ENCOUNTER — Encounter (HOSPITAL_COMMUNITY): Payer: Self-pay | Admitting: Occupational Therapy

## 2022-11-24 ENCOUNTER — Other Ambulatory Visit: Payer: Self-pay | Admitting: Cardiology

## 2022-11-24 DIAGNOSIS — E785 Hyperlipidemia, unspecified: Secondary | ICD-10-CM

## 2022-11-25 ENCOUNTER — Encounter (HOSPITAL_COMMUNITY): Payer: Self-pay | Admitting: Occupational Therapy

## 2022-11-25 ENCOUNTER — Ambulatory Visit (HOSPITAL_COMMUNITY): Payer: Medicare Other | Admitting: Occupational Therapy

## 2022-11-25 DIAGNOSIS — M25611 Stiffness of right shoulder, not elsewhere classified: Secondary | ICD-10-CM

## 2022-11-25 DIAGNOSIS — R29898 Other symptoms and signs involving the musculoskeletal system: Secondary | ICD-10-CM

## 2022-11-25 DIAGNOSIS — M25511 Pain in right shoulder: Secondary | ICD-10-CM | POA: Diagnosis not present

## 2022-11-25 NOTE — Patient Instructions (Signed)

## 2022-11-25 NOTE — Therapy (Signed)
OUTPATIENT OCCUPATIONAL THERAPY ORTHO TREATMENT NOTE  Patient Name: Sarah Cooper MRN: [id number] DOB:May 19, 1951, 72 y.o., female Today's Date: 11/25/2022  PCP: Haywood Pao, MD REFERRING PROVIDER: Hessie Dibble., MD  END OF SESSION:  OT End of Session - 11/25/22 0914     Visit Number 3    Number of Visits 9    Date for OT Re-Evaluation 12/13/22    Authorization Type UHC Medicare    Progress Note Due on Visit 10    OT Start Time 0910    OT Stop Time 0950    OT Time Calculation (min) 40 min    Activity Tolerance Patient tolerated treatment well    Behavior During Therapy [facility] for tasks assessed/performed             Past Medical History:  Diagnosis Date   Arthritis    back, hips, knees   Cancer ([place]) 1978   cervical   CHF (congestive heart failure) ([place])    Phreesia 11/28/2020   Hyperlipidemia 12/01/2020   Hypertension    Pneumothorax 1977   PONV (postoperative nausea and vomiting)    Past Surgical History:  Procedure Laterality Date   BREAST BIOPSY Left    approx @ 48 benign    collapsed lung on right      [place]     age 52   HIP SURGERY  2007   right   JOINT REPLACEMENT  2007   TOTAL HIP ARTHROPLASTY Left 07/25/2020   Procedure: LEFT TOTAL HIP ARTHROPLASTY ANTERIOR APPROACH;  Surgeon: Melrose Nakayama, MD;  [facility];  Service: Orthopedics;  Laterality: Left;   TOTAL KNEE ARTHROPLASTY Left 12/18/2021   Procedure: LEFT TOTAL KNEE ARTHROPLASTY;  Surgeon: Melrose Nakayama, MD;  [facility];  Service: Orthopedics;  Laterality: Left;   Patient Active Problem List   Diagnosis Date Noted   Stage 3a chronic kidney disease ([place]) 10/05/2021   Muscle spasm 02/06/2021   Insomnia, unspecified 12/01/2020   Hyperlipidemia 12/01/2020   Chronic diastolic heart failure ([place]) 12/01/2020   Leg edema 12/01/2020   Primary localized osteoarthritis of left hip 07/25/2020   Tubular adenoma of colon 12/11/2016   Dyspnea 12/11/2016    Vitamin D deficiency 11/21/2016   Pre-diabetes 11/21/2016   Essential hypertension 11/19/2016   Depression, recurrent ([place]) 11/19/2016   Osteoarthritis 11/19/2016   Chronic back pain greater than 3 months duration 11/19/2016   History of cervical dysplasia 11/19/2016   Morbid obesity ([place]) 11/19/2016    ONSET DATE: 09/20/23  REFERRING DIAG: R Rotator Cuff Tear  THERAPY DIAG:  Acute pain of right shoulder  Shoulder stiffness, right  Other symptoms and signs involving the musculoskeletal system  Rationale for Evaluation and Treatment: Rehabilitation  SUBJECTIVE:   SUBJECTIVE STATEMENT: "I've got a lot going on at home." Pt accompanied by: self  PERTINENT HISTORY: Pt presents to OT s/p R rotator cuff scope due to R rotator cuff tear  PRECAUTIONS: Shoulder  WEIGHT BEARING RESTRICTIONS: No  PAIN:  Are you having pain? Yes: NPRS scale: 6/10 Pain location: Deltoid area Pain description: clicking and sharp Aggravating factors: movement Relieving factors: Ice, medication  FALLS: Has patient fallen in last 6 months? No  PATIENT GOALS: To relieve pain and get my shoulder moving better  NEXT MD VISIT: 11/27/22  OBJECTIVE:   HAND DOMINANCE: Right  ADLs: Overall ADLs: Pt reports increased difficulty with bathing her UB and dressing, Unable to clean the house at this time and difficulty with  holding up pots and pans.  FUNCTIONAL OUTCOME MEASURES: FOTO: 40.70  UPPER EXTREMITY ROM:     Active ROM Right eval  Shoulder flexion 57  Shoulder abduction 53  Shoulder internal rotation 90  Shoulder external rotation 47  (Blank rows = not tested)   UPPER EXTREMITY MMT:     MMT Right eval  Shoulder flexion 3+/5  Shoulder abduction 4-/5  Shoulder adduction 4/5  Shoulder extension 4/5  Shoulder internal rotation 4/5  Shoulder external rotation 4-/5  Elbow flexion 5/5  (Blank rows = not tested)  HAND FUNCTION: Grip strength: Right: 50 lbs; Left: 50  lbs  SENSATION: WFL  EDEMA: Pt reports swelling in the bicep region of RUE  OBSERVATIONS: Severe fascial restrictions in the biceps and trapezius   TODAY'S TREATMENT:                                                                                                                              DATE:   11/25/22 -Manual Therapy: myofascial release and trigger point applied to biceps, trapezius, and subscapularis in order to address pain and fascial restrictions to improve ROM.  -P/ROM: supine, flexion, abduction, horizontal abduction, er/IR, x10 -AA/ROM: supine, flexion, abduction, protraction, horizontal abduction, er/IR, x10 -Wall Slides: flexion and abduction, x10 each  11/19/22 -Manual Therapy: myofascial release and trigger point applied to biceps, trapezius, and subscapularis in order to address pain and fascial restrictions to improve ROM.  -P/ROM: supine, flexion, abduction, er/IR, x10 -Ball Rolls: flexion and abduction x10 each -Pulleys: flexion and abduction x60" each    PATIENT EDUCATION: Education details: Systems analyst Person educated: Patient Education method: Explanation, Demonstration, and Handouts Education comprehension: verbalized understanding and returned demonstration  HOME EXERCISE PROGRAM: 2/2: Pendulums and Table Slides 2/6: Willeen Niece 2/12: Wall Slides  GOALS: Goals reviewed with patient? Yes  SHORT TERM GOALS: Target date: 12/13/22  Pt was provided and educated on a comprehensive HEP for RUE mobility during ADL completion.  Goal status: IN PROGRESS  2.  Pt will decrease pain to 3/10 in RUE in order to sleep for 3+ consecutive hours without waking due to pain.  Goal status: IN PROGRESS  3.  Pt will decrease fascial restrictions to minimal amounts or less in the RUE in order to improve overhead reaching.  Goal status: IN PROGRESS  4.  Pt will improve A/ROM in RUE to [facility] in order to improve overhead and behind the back reaching during dressing and  bathing tasks.  Goal status: IN PROGRESS  5.  Pt will improve strength in RUE to 4+/5 in order to improve ability to lift during cooking and cleaning tasks.  Goal status: IN PROGRESS    ASSESSMENT:  CLINICAL IMPRESSION: This session pt continued to work on ROM. She continues to have increased fascial restrictions along her bicep, subscapularis, and trapezius, which were addressed with manual therapy. Pt reports she has been unable to work on her HEP due to family emergencies, however  reports knowing that she needs to try to work on it more because she feels tight and restricted. OT added AA/ROM and wall slides to continue progressing her ROM and relaxation to reach end ranges. Verbal and tactile cuing provided for positioning and technique.    PERFORMANCE DEFICITS: in functional skills including ADLs, IADLs, ROM, strength, pain, fascial restrictions, Gross motor control, body mechanics, and UE functional use.    PLAN:  OT FREQUENCY: 2x/week  OT DURATION: 4 weeks  PLANNED INTERVENTIONS: self care/ADL training, therapeutic exercise, therapeutic activity, manual therapy, passive range of motion, functional mobility training, electrical stimulation, ultrasound, moist heat, cryotherapy, patient/family education, energy conservation, coping strategies training, and DME and/or AE instructions  RECOMMENDED OTHER SERVICES: N/A  CONSULTED AND AGREED WITH PLAN OF CARE: Patient  PLAN FOR NEXT SESSION: Manual Therapy, P/ROM, AA/ROM, Isometrics   Jensyn Cambria Yolanda Bonine, OTR/L [facility] [place], [place] 11/25/2022, 9:15 AM

## 2022-11-29 ENCOUNTER — Ambulatory Visit (HOSPITAL_COMMUNITY): Payer: Medicare Other | Admitting: Occupational Therapy

## 2022-11-29 ENCOUNTER — Encounter (HOSPITAL_COMMUNITY): Payer: Self-pay | Admitting: Occupational Therapy

## 2022-11-29 DIAGNOSIS — M25511 Pain in right shoulder: Secondary | ICD-10-CM

## 2022-11-29 DIAGNOSIS — R29898 Other symptoms and signs involving the musculoskeletal system: Secondary | ICD-10-CM

## 2022-11-29 DIAGNOSIS — M25611 Stiffness of right shoulder, not elsewhere classified: Secondary | ICD-10-CM

## 2022-11-29 NOTE — Therapy (Unsigned)
OUTPATIENT OCCUPATIONAL THERAPY ORTHO TREATMENT NOTE  Patient Name: Sarah Cooper MRN: [id number] DOB:20-Nov-1950, 72 y.o., female Today's Date: 11/29/2022  PCP: Haywood Pao, MD REFERRING PROVIDER: Hessie Dibble., MD  END OF SESSION:  OT End of Session - 11/29/22 1035     Visit Number 4    Number of Visits 9    Date for OT Re-Evaluation 12/13/22    Authorization Type UHC Medicare    Progress Note Due on Visit 10    OT Start Time 1033    OT Stop Time 1113    OT Time Calculation (min) 40 min    Activity Tolerance Patient tolerated treatment well    Behavior During Therapy WFL for tasks assessed/performed             Past Medical History:  Diagnosis Date   Arthritis    back, hips, knees   Cancer ([place]) 1978   cervical   CHF (congestive heart failure) ([place])    Phreesia 11/28/2020   Hyperlipidemia 12/01/2020   Hypertension    Pneumothorax 1977   PONV (postoperative nausea and vomiting)    Past Surgical History:  Procedure Laterality Date   BREAST BIOPSY Left    approx @ 48 benign    collapsed lung on right      [place]     age 33   HIP SURGERY  2007   right   JOINT REPLACEMENT  2007   TOTAL HIP ARTHROPLASTY Left 07/25/2020   Procedure: LEFT TOTAL HIP ARTHROPLASTY ANTERIOR APPROACH;  Surgeon: Melrose Nakayama, MD;  [facility];  Service: Orthopedics;  Laterality: Left;   TOTAL KNEE ARTHROPLASTY Left 12/18/2021   Procedure: LEFT TOTAL KNEE ARTHROPLASTY;  Surgeon: Melrose Nakayama, MD;  [facility];  Service: Orthopedics;  Laterality: Left;   Patient Active Problem List   Diagnosis Date Noted   Stage 3a chronic kidney disease ([place]) 10/05/2021   Muscle spasm 02/06/2021   Insomnia, unspecified 12/01/2020   Hyperlipidemia 12/01/2020   Chronic diastolic heart failure ([place]) 12/01/2020   Leg edema 12/01/2020   Primary localized osteoarthritis of left hip 07/25/2020   Tubular adenoma of colon 12/11/2016   Dyspnea 12/11/2016    Vitamin D deficiency 11/21/2016   Pre-diabetes 11/21/2016   Essential hypertension 11/19/2016   Depression, recurrent ([place]) 11/19/2016   Osteoarthritis 11/19/2016   Chronic back pain greater than 3 months duration 11/19/2016   History of cervical dysplasia 11/19/2016   Morbid obesity ([place]) 11/19/2016    ONSET DATE: 09/20/23  REFERRING DIAG: R Rotator Cuff Tear  THERAPY DIAG:  Acute pain of right shoulder  Shoulder stiffness, right  Other symptoms and signs involving the musculoskeletal system  Rationale for Evaluation and Treatment: Rehabilitation  SUBJECTIVE:   SUBJECTIVE STATEMENT: "I've got a lot going on at home." Pt accompanied by: self  PERTINENT HISTORY: Pt presents to OT s/p R rotator cuff scope due to R rotator cuff tear  PRECAUTIONS: Shoulder  WEIGHT BEARING RESTRICTIONS: No  PAIN:  Are you having pain? Yes: NPRS scale: 6/10 Pain location: Deltoid area Pain description: clicking and sharp Aggravating factors: movement Relieving factors: Ice, medication  FALLS: Has patient fallen in last 6 months? No  PATIENT GOALS: To relieve pain and get my shoulder moving better  NEXT MD VISIT: 11/27/22  OBJECTIVE:   HAND DOMINANCE: Right  ADLs: Overall ADLs: Pt reports increased difficulty with bathing her UB and dressing, Unable to clean the house at this time and difficulty with  holding up pots and pans.  FUNCTIONAL OUTCOME MEASURES: FOTO: 40.70  UPPER EXTREMITY ROM:     Active ROM Right eval  Shoulder flexion 57  Shoulder abduction 53  Shoulder internal rotation 90  Shoulder external rotation 47  (Blank rows = not tested)   UPPER EXTREMITY MMT:     MMT Right eval  Shoulder flexion 3+/5  Shoulder abduction 4-/5  Shoulder adduction 4/5  Shoulder extension 4/5  Shoulder internal rotation 4/5  Shoulder external rotation 4-/5  Elbow flexion 5/5  (Blank rows = not tested)  HAND FUNCTION: Grip strength: Right: 50 lbs; Left: 50  lbs  SENSATION: WFL  EDEMA: Pt reports swelling in the bicep region of RUE  OBSERVATIONS: Severe fascial restrictions in the biceps and trapezius   TODAY'S TREATMENT:                                                                                                                              DATE:   11/29/22 -Manual Therapy: myofascial release and trigger point applied to biceps, trapezius, and subscapularis in order to address pain and fascial restrictions to improve ROM.  -P/ROM: supine, flexion, abduction, horizontal abduction, er/IR, x10 -AA/ROM: supine, flexion, abduction, protraction, horizontal abduction, er/IR, x10 -Isometrics: flexion, extension, abduction, er, IR, 5x10"  11/25/22 -Manual Therapy: myofascial release and trigger point applied to biceps, trapezius, and subscapularis in order to address pain and fascial restrictions to improve ROM.  -P/ROM: supine, flexion, abduction, horizontal abduction, er/IR, x10 -AA/ROM: supine, flexion, abduction, protraction, horizontal abduction, er/IR, x10 -Wall Slides: flexion and abduction, x10 each  11/19/22 -Manual Therapy: myofascial release and trigger point applied to biceps, trapezius, and subscapularis in order to address pain and fascial restrictions to improve ROM.  -P/ROM: supine, flexion, abduction, er/IR, x10 -Ball Rolls: flexion and abduction x10 each -Pulleys: flexion and abduction x60" each    PATIENT EDUCATION: Education details: AA/ROM Person educated: Patient Education method: Consulting civil engineer, Media planner, and Handouts Education comprehension: verbalized understanding and returned demonstration  HOME EXERCISE PROGRAM: 2/2: Pendulums and Table Slides 2/6: Willeen Niece 2/12: Wall Slides 2/16: AA/ROM  GOALS: Goals reviewed with patient? Yes  SHORT TERM GOALS: Target date: 12/13/22  Pt was provided and educated on a comprehensive HEP for RUE mobility during ADL completion.  Goal status: IN PROGRESS  2.  Pt will  decrease pain to 3/10 in RUE in order to sleep for 3+ consecutive hours without waking due to pain.  Goal status: IN PROGRESS  3.  Pt will decrease fascial restrictions to minimal amounts or less in the RUE in order to improve overhead reaching.  Goal status: IN PROGRESS  4.  Pt will improve A/ROM in RUE to [facility] in order to improve overhead and behind the back reaching during dressing and bathing tasks.  Goal status: IN PROGRESS  5.  Pt will improve strength in RUE to 4+/5 in order to improve ability to lift during cooking and cleaning tasks.  Goal status:  IN PROGRESS    ASSESSMENT:  CLINICAL IMPRESSION: This session pt continued to work on ROM. She continues to have increased fascial restrictions along her bicep, subscapularis, and trapezius, which were addressed with manual therapy. Pt reports she has been unable to work on her HEP due to family emergencies, however reports knowing that she needs to try to work on it more because she feels tight and restricted. OT added AA/ROM and wall slides to continue progressing her ROM and relaxation to reach end ranges. Verbal and tactile cuing provided for positioning and technique.    PERFORMANCE DEFICITS: in functional skills including ADLs, IADLs, ROM, strength, pain, fascial restrictions, Gross motor control, body mechanics, and UE functional use.    PLAN:  OT FREQUENCY: 2x/week  OT DURATION: 4 weeks  PLANNED INTERVENTIONS: self care/ADL training, therapeutic exercise, therapeutic activity, manual therapy, passive range of motion, functional mobility training, electrical stimulation, ultrasound, moist heat, cryotherapy, patient/family education, energy conservation, coping strategies training, and DME and/or AE instructions  RECOMMENDED OTHER SERVICES: N/A  CONSULTED AND AGREED WITH PLAN OF CARE: Patient  PLAN FOR NEXT SESSION: Manual Therapy, P/ROM, AA/ROM, Isometrics   Jason Hauge Yolanda Bonine, OTR/L [facility] [place], [place] 11/29/2022, 10:36 AM

## 2022-11-29 NOTE — Patient Instructions (Signed)

## 2022-12-02 ENCOUNTER — Encounter (HOSPITAL_COMMUNITY): Payer: Self-pay | Admitting: Occupational Therapy

## 2022-12-04 ENCOUNTER — Ambulatory Visit (HOSPITAL_COMMUNITY): Payer: Medicare Other | Admitting: Occupational Therapy

## 2022-12-04 ENCOUNTER — Encounter (HOSPITAL_COMMUNITY): Payer: Self-pay | Admitting: Occupational Therapy

## 2022-12-04 DIAGNOSIS — M25611 Stiffness of right shoulder, not elsewhere classified: Secondary | ICD-10-CM

## 2022-12-04 DIAGNOSIS — M25511 Pain in right shoulder: Secondary | ICD-10-CM

## 2022-12-04 DIAGNOSIS — R29898 Other symptoms and signs involving the musculoskeletal system: Secondary | ICD-10-CM

## 2022-12-04 NOTE — Therapy (Signed)
OUTPATIENT OCCUPATIONAL THERAPY ORTHO REASSESSMENT & TREATMENT NOTE RECERTIFICATION  Patient Name: NOYA GRIFFUS MRN: [id number] DOB:[birth year], 72 y.o., female Today's Date: 12/04/2022  PCP: Haywood Pao, MD REFERRING PROVIDER: Hessie Dibble., MD  END OF SESSION:  OT End of Session - 12/04/22 0813     Visit Number 5    Number of Visits 9    Date for OT Re-Evaluation 01/03/23    Authorization Type [employer] Medicare    Progress Note Due on Visit 10    OT Start Time 0731    OT Stop Time 0811    OT Time Calculation (min) 40 min    Activity Tolerance Patient tolerated treatment well    Behavior During Therapy [place] for tasks assessed/performed              Past Medical History:  Diagnosis Date   Arthritis    back, hips, knees   Cancer ([place]) 1978   cervical   CHF (congestive heart failure) ([place])    Phreesia 11/28/2020   Hyperlipidemia 12/01/2020   Hypertension    Pneumothorax 1977   PONV (postoperative nausea and vomiting)    Past Surgical History:  Procedure Laterality Date   BREAST BIOPSY Left    approx @ 48 benign    collapsed lung on right      [place]     age 72   HIP SURGERY  2007   right   JOINT REPLACEMENT  2007   TOTAL HIP ARTHROPLASTY Left 07/25/2020   Procedure: LEFT TOTAL HIP ARTHROPLASTY ANTERIOR APPROACH;  Surgeon: Melrose Nakayama, MD;  Location: [facility];  Service: Orthopedics;  Laterality: Left;   TOTAL KNEE ARTHROPLASTY Left 12/18/2021   Procedure: LEFT TOTAL KNEE ARTHROPLASTY;  Surgeon: Melrose Nakayama, MD;  Location: [facility];  Service: Orthopedics;  Laterality: Left;   Patient Active Problem List   Diagnosis Date Noted   Stage 3a chronic kidney disease ([place]) 10/05/2021   Muscle spasm 02/06/2021   Insomnia, unspecified 12/01/2020   Hyperlipidemia 12/01/2020   Chronic diastolic heart failure ([place]) 12/01/2020   Leg edema 12/01/2020   Primary localized osteoarthritis of left hip 07/25/2020   Tubular adenoma of colon  12/11/2016   Dyspnea 12/11/2016   Vitamin D deficiency 11/21/2016   Pre-diabetes 11/21/2016   Essential hypertension 11/19/2016   Depression, recurrent ([place]) 11/19/2016   Osteoarthritis 11/19/2016   Chronic back pain greater than 3 months duration 11/19/2016   History of cervical dysplasia 11/19/2016   Morbid obesity ([place]) 11/19/2016    ONSET DATE: 09/20/23  REFERRING DIAG: R Rotator Cuff Tear  THERAPY DIAG:  Acute pain of right shoulder  Shoulder stiffness, right  Other symptoms and signs involving the musculoskeletal system  Rationale for Evaluation and Treatment: Rehabilitation  SUBJECTIVE:   SUBJECTIVE STATEMENT: "I'm the same."  PERTINENT HISTORY: Pt presents to OT s/p R rotator cuff scope due to R rotator cuff tear  PRECAUTIONS: Shoulder  WEIGHT BEARING RESTRICTIONS: No  PAIN:  Are you having pain? No  FALLS: Has patient fallen in last 6 months? No  PATIENT GOALS: To relieve pain and get my shoulder moving better  NEXT MD VISIT: unsure  OBJECTIVE:   HAND DOMINANCE: Right  ADLs: Overall ADLs: Pt reports increased difficulty with bathing her UB and dressing, Unable to clean the house at this time and difficulty with holding up pots and pans.  FUNCTIONAL OUTCOME MEASURES: FOTO: 40.70  UPPER EXTREMITY ROM:       Assessed supine, er/IR  adducted    Passive ROM Right eval Right 12/04/22  Shoulder flexion  120  Shoulder abduction  98  Shoulder internal rotation  90  Shoulder external rotation  42  (Blank rows = not tested)  Assessed seated, er/IR adducted  Active ROM Right eval Right 12/04/22  Shoulder flexion 57 100  Shoulder abduction 53 69  Shoulder internal rotation 90 90  Shoulder external rotation 47 54  (Blank rows = not tested)   UPPER EXTREMITY MMT:       Assessed seated, er/IR adducted  MMT Right eval Right 12/04/22  Shoulder flexion 3+/5 3+/5  Shoulder abduction 4-/5 3+/5  Shoulder adduction 4/5 4/5  Shoulder extension 4/5  4/5  Shoulder internal rotation 4/5 5/5  Shoulder external rotation 4-/5 4+/5  (Blank rows = not tested)  HAND FUNCTION: Grip strength: Right: 50 lbs; Left: 50 lbs  EDEMA: Pt reports swelling in the bicep region of RUE  OBSERVATIONS: Severe fascial restrictions in the biceps and trapezius   TODAY'S TREATMENT:                                                                                                                              DATE:  12/04/22 -Manual Therapy: myofascial release and trigger point applied to biceps, trapezius, and subscapularis in order to address pain and fascial restrictions to improve ROM.  -P/ROM: supine, flexion, abduction, horizontal abduction, er/IR, x5 -AA/ROM: supine, flexion, abduction, protraction, er/IR, x10 -Pulleys: 1' flexion, 1' abduction  11/29/22 -Manual Therapy: myofascial release and trigger point applied to biceps, trapezius, and subscapularis in order to address pain and fascial restrictions to improve ROM.  -P/ROM: supine, flexion, abduction, horizontal abduction, er/IR, x10 -AA/ROM: supine, flexion, abduction, protraction, horizontal abduction, er/IR, x10 -Isometrics: flexion, extension, abduction, er, IR, 5x10"  11/25/22 -Manual Therapy: myofascial release and trigger point applied to biceps, trapezius, and subscapularis in order to address pain and fascial restrictions to improve ROM.  -P/ROM: supine, flexion, abduction, horizontal abduction, er/IR, x10 -AA/ROM: supine, flexion, abduction, protraction, horizontal abduction, er/IR, x10 -Wall Slides: flexion and abduction, x10 each   PATIENT EDUCATION: Education details: reviewed importance of daily HEP completion Person educated: Patient Education method: Explanation, Demonstration, and Handouts Education comprehension: verbalized understanding and returned demonstration  HOME EXERCISE PROGRAM: 2/2: Pendulums and Table Slides 2/6: Willeen Niece 2/12: Wall Slides 2/16:  AA/ROM  GOALS: Goals reviewed with patient? Yes  SHORT TERM GOALS: Target date: 12/13/22  Pt was provided and educated on a comprehensive HEP for RUE mobility during ADL completion.  Goal status: IN PROGRESS  2.  Pt will decrease pain to 3/10 in RUE in order to sleep for 3+ consecutive hours without waking due to pain.  Goal status: IN PROGRESS  3.  Pt will decrease fascial restrictions to minimal amounts or less in the RUE in order to improve overhead reaching.  Goal status: IN PROGRESS  4.  Pt will improve A/ROM in RUE to [facility] in order to  improve overhead and behind the back reaching during dressing and bathing tasks.  Goal status: IN PROGRESS  5.  Pt will improve strength in RUE to 4+/5 in order to improve ability to lift during cooking and cleaning tasks.  Goal status: IN PROGRESS    ASSESSMENT:  CLINICAL IMPRESSION: Reassessment completed this session. Pt reports she is not completing her HEP like she should and has pain with functional use and when attempting to sleep. Measured P/ROM for baseline numbers, pt is making progress and is working towards improved ROM and strength in the RUE. Discussed importance of daily HEP completion, beginning with gentle stretches then completing ROM exercises. Pt with pain at 90 degrees with flexion, reports catching sensation, improves somewhat with joint distraction. Pt unable to complete horizontal abduction today due to pain. Verbal cuing for form and technique. Pt will benefit from continued skilled OT services to decrease pain and fascial restrictions, and increase joint ROM, strength, and functional use of the RUE.    PERFORMANCE DEFICITS: in functional skills including ADLs, IADLs, ROM, strength, pain, fascial restrictions, Gross motor control, body mechanics, and UE functional use.    PLAN:  OT FREQUENCY: 2x/week  OT DURATION: 4 weeks  PLANNED INTERVENTIONS: self care/ADL training, therapeutic exercise, therapeutic activity, manual  therapy, passive range of motion, functional mobility training, electrical stimulation, ultrasound, moist heat, cryotherapy, patient/family education, energy conservation, coping strategies training, and DME and/or AE instructions  RECOMMENDED OTHER SERVICES: N/A  CONSULTED AND AGREED WITH PLAN OF CARE: Patient  PLAN FOR NEXT SESSION: Manual Therapy, P/ROM, AA/ROM, Isometrics, wall slides, proximal shoulder strengthening   Guadelupe Sabin, OTR/L  [phone] 12/04/2022, 8:14 AM

## 2022-12-12 ENCOUNTER — Ambulatory Visit (HOSPITAL_COMMUNITY): Payer: Medicare Other | Admitting: Occupational Therapy

## 2022-12-12 ENCOUNTER — Encounter (HOSPITAL_COMMUNITY): Payer: Self-pay | Admitting: Occupational Therapy

## 2022-12-12 ENCOUNTER — Encounter: Payer: Self-pay | Admitting: Radiology

## 2022-12-12 DIAGNOSIS — M25511 Pain in right shoulder: Secondary | ICD-10-CM | POA: Diagnosis not present

## 2022-12-12 DIAGNOSIS — R29898 Other symptoms and signs involving the musculoskeletal system: Secondary | ICD-10-CM

## 2022-12-12 DIAGNOSIS — M25611 Stiffness of right shoulder, not elsewhere classified: Secondary | ICD-10-CM

## 2022-12-12 NOTE — Patient Instructions (Signed)

## 2022-12-12 NOTE — Therapy (Signed)
OUTPATIENT OCCUPATIONAL THERAPY ORTHO TREATMENT NOTE   Patient Name: Sarah Cooper MRN: [id number] DOB:08/20/1951, 72 y.o., female Today's Date: 12/12/2022  PCP: Haywood Pao, MD REFERRING PROVIDER: Hessie Dibble., MD  END OF SESSION:  OT End of Session - 12/12/22 1520     Visit Number 6    Number of Visits 9    Date for OT Re-Evaluation 01/03/23    Authorization Type [employer] Medicare    Progress Note Due on Visit 10    OT Start Time 1520    OT Stop Time 1600    OT Time Calculation (min) 40 min    Activity Tolerance Patient tolerated treatment well    Behavior During Therapy WFL for tasks assessed/performed              Past Medical History:  Diagnosis Date   Arthritis    back, hips, knees   Cancer ([place]) 1978   cervical   CHF (congestive heart failure) ([place])    Phreesia 11/28/2020   Hyperlipidemia 12/01/2020   Hypertension    Pneumothorax 1977   PONV (postoperative nausea and vomiting)    Past Surgical History:  Procedure Laterality Date   BREAST BIOPSY Left    approx @ 48 benign    collapsed lung on right      [place]     age 49   HIP SURGERY  2007   right   JOINT REPLACEMENT  2007   TOTAL HIP ARTHROPLASTY Left 07/25/2020   Procedure: LEFT TOTAL HIP ARTHROPLASTY ANTERIOR APPROACH;  Surgeon: Melrose Nakayama, MD;  Location: [facility];  Service: Orthopedics;  Laterality: Left;   TOTAL KNEE ARTHROPLASTY Left 12/18/2021   Procedure: LEFT TOTAL KNEE ARTHROPLASTY;  Surgeon: Melrose Nakayama, MD;  Location: [facility];  Service: Orthopedics;  Laterality: Left;   Patient Active Problem List   Diagnosis Date Noted   Stage 3a chronic kidney disease ([place]) 10/05/2021   Muscle spasm 02/06/2021   Insomnia, unspecified 12/01/2020   Hyperlipidemia 12/01/2020   Chronic diastolic heart failure ([place]) 12/01/2020   Leg edema 12/01/2020   Primary localized osteoarthritis of left hip 07/25/2020   Tubular adenoma of colon 12/11/2016   Dyspnea 12/11/2016    Vitamin D deficiency 11/21/2016   Pre-diabetes 11/21/2016   Essential hypertension 11/19/2016   Depression, recurrent ([place]) 11/19/2016   Osteoarthritis 11/19/2016   Chronic back pain greater than 3 months duration 11/19/2016   History of cervical dysplasia 11/19/2016   Morbid obesity ([place]) 11/19/2016    ONSET DATE: 09/20/23  REFERRING DIAG: R Rotator Cuff Tear  THERAPY DIAG:  Acute pain of right shoulder  Shoulder stiffness, right  Other symptoms and signs involving the musculoskeletal system  Rationale for Evaluation and Treatment: Rehabilitation  SUBJECTIVE:   SUBJECTIVE STATEMENT: "It is still just painful when I move"  PERTINENT HISTORY: Pt presents to OT s/p R rotator cuff scope due to R rotator cuff tear  PRECAUTIONS: Shoulder  WEIGHT BEARING RESTRICTIONS: No  PAIN:  Are you having pain? No  FALLS: Has patient fallen in last 6 months? No  PATIENT GOALS: To relieve pain and get my shoulder moving better  NEXT MD VISIT: unsure  OBJECTIVE:   HAND DOMINANCE: Right  ADLs: Overall ADLs: Pt reports increased difficulty with bathing her UB and dressing, Unable to clean the house at this time and difficulty with holding up pots and pans.  FUNCTIONAL OUTCOME MEASURES: FOTO: 40.70  UPPER EXTREMITY ROM:  Assessed supine, er/IR adducted    Passive ROM Right eval Right 12/04/22  Shoulder flexion  120  Shoulder abduction  98  Shoulder internal rotation  90  Shoulder external rotation  42  (Blank rows = not tested)  Assessed seated, er/IR adducted  Active ROM Right eval Right 12/04/22  Shoulder flexion 57 100  Shoulder abduction 53 69  Shoulder internal rotation 90 90  Shoulder external rotation 47 54  (Blank rows = not tested)   UPPER EXTREMITY MMT:       Assessed seated, er/IR adducted  MMT Right eval Right 12/04/22  Shoulder flexion 3+/5 3+/5  Shoulder abduction 4-/5 3+/5  Shoulder adduction 4/5 4/5  Shoulder extension 4/5 4/5   Shoulder internal rotation 4/5 5/5  Shoulder external rotation 4-/5 4+/5  (Blank rows = not tested)  HAND FUNCTION: Grip strength: Right: 50 lbs; Left: 50 lbs  EDEMA: Pt reports swelling in the bicep region of RUE  OBSERVATIONS: Severe fascial restrictions in the biceps and trapezius   TODAY'S TREATMENT:                                                                                                                              DATE:  12/12/22 -AA/ROM: seated, flexion, abduction, protraction, horizontal abduction, er/IR, x10 -A/ROM: seated, flexion, abduction, protraction, horizontal abduction, er/IR, x10 -Wall slides: flexion and abduction, x10 each -Wall [place] in flexion 60" -Pulleys: flexion and abduction, x60" -Scapular Strengthening: red theraband, extension, retraction, rows, x10  12/04/22 -Manual Therapy: myofascial release and trigger point applied to biceps, trapezius, and subscapularis in order to address pain and fascial restrictions to improve ROM.  -P/ROM: supine, flexion, abduction, horizontal abduction, er/IR, x5 -AA/ROM: supine, flexion, abduction, protraction, er/IR, x10 -Pulleys: 1' flexion, 1' abduction  11/29/22 -Manual Therapy: myofascial release and trigger point applied to biceps, trapezius, and subscapularis in order to address pain and fascial restrictions to improve ROM.  -P/ROM: supine, flexion, abduction, horizontal abduction, er/IR, x10 -AA/ROM: supine, flexion, abduction, protraction, horizontal abduction, er/IR, x10 -Isometrics: flexion, extension, abduction, er, IR, 5x10"   PATIENT EDUCATION: Education Banker Person educated: Patient Education method: Explanation, Demonstration, and Handouts Education comprehension: verbalized understanding and returned demonstration  HOME EXERCISE PROGRAM: 2/2: Pendulums and Table Slides 2/6: Willeen Niece 2/12: Wall Slides 2/16: AA/ROM 2/29: Scapular Strengthening  GOALS: Goals  reviewed with patient? Yes  SHORT TERM GOALS: Target date: 12/13/22  Pt was provided and educated on a comprehensive HEP for RUE mobility during ADL completion.  Goal status: IN PROGRESS  2.  Pt will decrease pain to 3/10 in RUE in order to sleep for 3+ consecutive hours without waking due to pain.  Goal status: IN PROGRESS  3.  Pt will decrease fascial restrictions to minimal amounts or less in the RUE in order to improve overhead reaching.  Goal status: IN PROGRESS  4.  Pt will improve A/ROM in RUE to [facility] in order to improve overhead and behind the  back reaching during dressing and bathing tasks.  Goal status: IN PROGRESS  5.  Pt will improve strength in RUE to 4+/5 in order to improve ability to lift during cooking and cleaning tasks.  Goal status: IN PROGRESS    ASSESSMENT:  CLINICAL IMPRESSION: Pt presenting to this session with continued pain with movement and limited active ROM. She continues to work on joint distraction during AA/ROM, which assisted moderately with pain relief. She tolerated A/ROM with 6/10 pain and approximately 65-70% of full ROM. OT had pt start on scapular strengthening this session to initiate lower level strengthening while not agitating the shoulder joint further. Verbal and tactile cuing provided for positioning and technique.    PERFORMANCE DEFICITS: in functional skills including ADLs, IADLs, ROM, strength, pain, fascial restrictions, Gross motor control, body mechanics, and UE functional use.    PLAN:  OT FREQUENCY: 2x/week  OT DURATION: 4 weeks  PLANNED INTERVENTIONS: self care/ADL training, therapeutic exercise, therapeutic activity, manual therapy, passive range of motion, functional mobility training, electrical stimulation, ultrasound, moist heat, cryotherapy, patient/family education, energy conservation, coping strategies training, and DME and/or AE instructions  RECOMMENDED OTHER SERVICES: N/A  CONSULTED AND AGREED WITH PLAN OF CARE:  Patient  PLAN FOR NEXT SESSION: Manual Therapy, P/ROM, AA/ROM, Isometrics, wall slides, proximal shoulder strengthening, scapular strengthening, A/ROM   Paulita Fujita, OTR/L [phone] 12/12/2022, 3:21 PM

## 2022-12-17 ENCOUNTER — Encounter (HOSPITAL_COMMUNITY): Payer: Medicare Other | Admitting: Occupational Therapy

## 2022-12-19 ENCOUNTER — Encounter (HOSPITAL_COMMUNITY): Payer: Medicare Other | Admitting: Occupational Therapy

## 2022-12-24 ENCOUNTER — Encounter (HOSPITAL_COMMUNITY): Payer: Self-pay | Admitting: Occupational Therapy

## 2022-12-24 ENCOUNTER — Ambulatory Visit (HOSPITAL_COMMUNITY): Payer: Medicare Other | Attending: Internal Medicine | Admitting: Occupational Therapy

## 2022-12-24 DIAGNOSIS — M25611 Stiffness of right shoulder, not elsewhere classified: Secondary | ICD-10-CM

## 2022-12-24 DIAGNOSIS — M25511 Pain in right shoulder: Secondary | ICD-10-CM | POA: Insufficient documentation

## 2022-12-24 DIAGNOSIS — R29898 Other symptoms and signs involving the musculoskeletal system: Secondary | ICD-10-CM

## 2022-12-24 NOTE — Therapy (Signed)
OUTPATIENT OCCUPATIONAL THERAPY ORTHO TREATMENT NOTE  MINI REASSESSMENT   Patient Name: Sarah Cooper MRN: [id number] DOB:[birth year], 72 y.o., female Today's Date: 12/24/2022  PCP: Haywood Pao, MD REFERRING PROVIDER: Hessie Dibble., MD  END OF SESSION:  OT End of Session - 12/24/22 0857     Visit Number 7    Number of Visits 9    Date for OT Re-Evaluation 01/03/23    Authorization Type [employer] Medicare    Progress Note Due on Visit 10    OT Start Time 0900    OT Stop Time 0940    OT Time Calculation (min) 40 min    Activity Tolerance Patient tolerated treatment well    Behavior During Therapy WFL for tasks assessed/performed              Past Medical History:  Diagnosis Date   Arthritis    back, hips, knees   Cancer ([place]) 1978   cervical   CHF (congestive heart failure) ([place])    Phreesia 11/28/2020   Hyperlipidemia 12/01/2020   Hypertension    Pneumothorax 1977   PONV (postoperative nausea and vomiting)    Past Surgical History:  Procedure Laterality Date   BREAST BIOPSY Left    approx @ 48 benign    collapsed lung on right      [place]     age 1   HIP SURGERY  2007   right   JOINT REPLACEMENT  2007   TOTAL HIP ARTHROPLASTY Left 07/25/2020   Procedure: LEFT TOTAL HIP ARTHROPLASTY ANTERIOR APPROACH;  Surgeon: Melrose Nakayama, MD;  Location: [facility];  Service: Orthopedics;  Laterality: Left;   TOTAL KNEE ARTHROPLASTY Left 12/18/2021   Procedure: LEFT TOTAL KNEE ARTHROPLASTY;  Surgeon: Melrose Nakayama, MD;  Location: [facility];  Service: Orthopedics;  Laterality: Left;   Patient Active Problem List   Diagnosis Date Noted   Stage 3a chronic kidney disease ([place]) 10/05/2021   Muscle spasm 02/06/2021   Insomnia, unspecified 12/01/2020   Hyperlipidemia 12/01/2020   Chronic diastolic heart failure ([place]) 12/01/2020   Leg edema 12/01/2020   Primary localized osteoarthritis of left hip 07/25/2020   Tubular adenoma of colon 12/11/2016    Dyspnea 12/11/2016   Vitamin D deficiency 11/21/2016   Pre-diabetes 11/21/2016   Essential hypertension 11/19/2016   Depression, recurrent ([place]) 11/19/2016   Osteoarthritis 11/19/2016   Chronic back pain greater than 3 months duration 11/19/2016   History of cervical dysplasia 11/19/2016   Morbid obesity ([place]) 11/19/2016    ONSET DATE: 09/20/23  REFERRING DIAG: R Rotator Cuff Tear  THERAPY DIAG:  Acute pain of right shoulder  Shoulder stiffness, right  Other symptoms and signs involving the musculoskeletal system  Rationale for Evaluation and Treatment: Rehabilitation  SUBJECTIVE:   SUBJECTIVE STATEMENT: "I haven't been working on anything cause I haven't fell well."  PERTINENT HISTORY: Pt presents to OT s/p R rotator cuff scope due to R rotator cuff tear  PRECAUTIONS: Shoulder  WEIGHT BEARING RESTRICTIONS: No  PAIN:  Are you having pain? Yes: NPRS scale: 3/10 Pain location: anterior shoulder girdle Pain description: achy and sore Aggravating factors: movement Relieving factors: rest  FALLS: Has patient fallen in last 6 months? No  PATIENT GOALS: To relieve pain and get my shoulder moving better  NEXT MD VISIT: unsure  OBJECTIVE:   HAND DOMINANCE: Right  ADLs: Overall ADLs: Pt reports increased difficulty with bathing her UB and dressing, Unable to clean the house at this  time and difficulty with holding up pots and pans.  FUNCTIONAL OUTCOME MEASURES: FOTO: 40.70 12/24/22: 57.36  UPPER EXTREMITY ROM:       Assessed supine, er/IR adducted    Passive ROM Right eval Right 12/04/22  Shoulder flexion  120  Shoulder abduction  98  Shoulder internal rotation  90  Shoulder external rotation  42  (Blank rows = not tested)  Assessed seated, er/IR adducted  Active ROM Right eval Right 12/04/22 Right 12/24/22  Shoulder flexion 57 100 118  Shoulder abduction 53 69 132  Shoulder internal rotation 90 90 90  Shoulder external rotation 47 54 57  (Blank rows  = not tested)   UPPER EXTREMITY MMT:       Assessed seated, er/IR adducted  MMT Right eval Right 12/04/22 Right 12/24/22  Shoulder flexion 3+/5 3+/5 4/5  Shoulder abduction 4-/5 3+/5 3+/5  Shoulder adduction 4/5 4/5 4-/5  Shoulder extension 4/5 4/5 5/5  Shoulder internal rotation 4/5 5/5 5/5  Shoulder external rotation 4-/5 4+/5 4+/5  (Blank rows = not tested)  HAND FUNCTION: Grip strength: Right: 50 lbs; Left: 50 lbs  EDEMA: Pt reports swelling in the bicep region of RUE  OBSERVATIONS: Severe fascial restrictions in the biceps and trapezius   TODAY'S TREATMENT:                                                                                                                              DATE:  12/24/22 -AA/ROM: seated, 2lb dowel rod, flexion, abduction, protraction, horizontal abduction, er/IR, x10 -A/ROM: seated, flexion, abduction, protraction, horizontal abduction, er/IR, x10 -Shoulder strengthening: 1lb dumbbell, flexion, abduction, protraction, horizontal abduction, er/IR, x10 -Scapular Strengthening: red theraband, extension, retraction, rows, x10 -Ball Rolls: flexion on th wall x10 -UBE: level 1, 3.0+ KPH, 4' forward  12/12/22 -AA/ROM: seated, flexion, abduction, protraction, horizontal abduction, er/IR, x10 -A/ROM: seated, flexion, abduction, protraction, horizontal abduction, er/IR, x10 -Wall slides: flexion and abduction, x10 each -Wall Wash in flexion 60" -Pulleys: flexion and abduction, x60" -Scapular Strengthening: red theraband, extension, retraction, rows, x10  12/04/22 -Manual Therapy: myofascial release and trigger point applied to biceps, trapezius, and subscapularis in order to address pain and fascial restrictions to improve ROM.  -P/ROM: supine, flexion, abduction, horizontal abduction, er/IR, x5 -AA/ROM: supine, flexion, abduction, protraction, er/IR, x10 -Pulleys: 1' flexion, 1' abduction    PATIENT EDUCATION: Education details: Shoulder  Strengthening w/ Dumbbells Person educated: Patient Education method: Explanation, Demonstration, and Handouts Education comprehension: verbalized understanding and returned demonstration  HOME EXERCISE PROGRAM: 2/2: Pendulums and Table Slides 2/6: Willeen Niece 2/12: Wall Slides 2/16: AA/ROM 2/29: Scapular Strengthening 3/12: Shoulder Strengthening w/ dumbbells  GOALS: Goals reviewed with patient? Yes  SHORT TERM GOALS: Target date: 12/13/22  Pt was provided and educated on a comprehensive HEP for RUE mobility during ADL completion.  Goal status: IN PROGRESS  2.  Pt will decrease pain to 3/10 in RUE in order to sleep for 3+ consecutive hours without  waking due to pain.  Goal status: IN PROGRESS  3.  Pt will decrease fascial restrictions to minimal amounts or less in the RUE in order to improve overhead reaching.  Goal status: IN PROGRESS  4.  Pt will improve A/ROM in RUE to [facility] in order to improve overhead and behind the back reaching during dressing and bathing tasks.  Goal status: IN PROGRESS  5.  Pt will improve strength in RUE to 4+/5 in order to improve ability to lift during cooking and cleaning tasks.  Goal status: IN PROGRESS    ASSESSMENT:  CLINICAL IMPRESSION: This session pt worked on ROM as well as overall shoulder strengthening. She is achieving ~75% of full ROM, however towards these end ranges she is expressing increased pain. With shoulder strengthening, this session pt was able to use 2lb dumbbells with min-mod muscle fatigue, requiring short breaks between each set. OT providing verbal, visual, and tactile cuing for positioning and technique throughout session. She completed a mini reassessment this session which indicates that pt is making good progress overall with strength and ROM.    PERFORMANCE DEFICITS: in functional skills including ADLs, IADLs, ROM, strength, pain, fascial restrictions, Gross motor control, body mechanics, and UE functional  use.    PLAN:  OT FREQUENCY: 2x/week  OT DURATION: 4 weeks  PLANNED INTERVENTIONS: self care/ADL training, therapeutic exercise, therapeutic activity, manual therapy, passive range of motion, functional mobility training, electrical stimulation, ultrasound, moist heat, cryotherapy, patient/family education, energy conservation, coping strategies training, and DME and/or AE instructions  RECOMMENDED OTHER SERVICES: N/A  CONSULTED AND AGREED WITH PLAN OF CARE: Patient  PLAN FOR NEXT SESSION: Manual Therapy, P/ROM, AA/ROM, Isometrics, wall slides, proximal shoulder strengthening, scapular strengthening, A/ROM   Paulita Fujita, OTR/L [phone] 12/24/2022, 8:57 AM

## 2022-12-26 ENCOUNTER — Encounter (HOSPITAL_COMMUNITY): Payer: Self-pay | Admitting: Occupational Therapy

## 2022-12-26 ENCOUNTER — Ambulatory Visit (HOSPITAL_COMMUNITY): Payer: Medicare Other | Admitting: Occupational Therapy

## 2022-12-26 DIAGNOSIS — M25511 Pain in right shoulder: Secondary | ICD-10-CM | POA: Diagnosis not present

## 2022-12-26 DIAGNOSIS — M25611 Stiffness of right shoulder, not elsewhere classified: Secondary | ICD-10-CM

## 2022-12-26 DIAGNOSIS — R29898 Other symptoms and signs involving the musculoskeletal system: Secondary | ICD-10-CM

## 2022-12-26 NOTE — Therapy (Signed)
OUTPATIENT OCCUPATIONAL THERAPY ORTHO TREATMENT NOTE    Patient Name: Sarah Cooper MRN: [id number] DOB:1951-02-27, 72 y.o., female Today's Date: 12/26/2022  PCP: Haywood Pao, MD REFERRING PROVIDER: Hessie Dibble., MD  END OF SESSION:  OT End of Session - 12/26/22 0906     Visit Number 8    Number of Visits 9    Date for OT Re-Evaluation 01/03/23    Authorization Type [employer] Medicare    Progress Note Due on Visit 10    OT Start Time 0905    OT Stop Time 0945    OT Time Calculation (min) 40 min    Activity Tolerance Patient tolerated treatment well    Behavior During Therapy WFL for tasks assessed/performed              Past Medical History:  Diagnosis Date   Arthritis    back, hips, knees   Cancer ([place]) 1978   cervical   CHF (congestive heart failure) ([place])    Phreesia 11/28/2020   Hyperlipidemia 12/01/2020   Hypertension    Pneumothorax 1977   PONV (postoperative nausea and vomiting)    Past Surgical History:  Procedure Laterality Date   BREAST BIOPSY Left    approx @ 48 benign    collapsed lung on right      [place]     age 52   HIP SURGERY  2007   right   JOINT REPLACEMENT  2007   TOTAL HIP ARTHROPLASTY Left 07/25/2020   Procedure: LEFT TOTAL HIP ARTHROPLASTY ANTERIOR APPROACH;  Surgeon: Melrose Nakayama, MD;  [facility];  Service: Orthopedics;  Laterality: Left;   TOTAL KNEE ARTHROPLASTY Left 12/18/2021   Procedure: LEFT TOTAL KNEE ARTHROPLASTY;  Surgeon: Melrose Nakayama, MD;  Location: [facility];  Service: Orthopedics;  Laterality: Left;   Patient Active Problem List   Diagnosis Date Noted   Stage 3a chronic kidney disease ([place]) 10/05/2021   Muscle spasm 02/06/2021   Insomnia, unspecified 12/01/2020   Hyperlipidemia 12/01/2020   Chronic diastolic heart failure ([place]) 12/01/2020   Leg edema 12/01/2020   Primary localized osteoarthritis of left hip 07/25/2020   Tubular adenoma of colon 12/11/2016   Dyspnea 12/11/2016    Vitamin D deficiency 11/21/2016   Pre-diabetes 11/21/2016   Essential hypertension 11/19/2016   Depression, recurrent ([place]) 11/19/2016   Osteoarthritis 11/19/2016   Chronic back pain greater than 3 months duration 11/19/2016   History of cervical dysplasia 11/19/2016   Morbid obesity ([place]) 11/19/2016    ONSET DATE: 09/20/23  REFERRING DIAG: R Rotator Cuff Tear  THERAPY DIAG:  Acute pain of right shoulder  Shoulder stiffness, right  Other symptoms and signs involving the musculoskeletal system  Rationale for Evaluation and Treatment: Rehabilitation  SUBJECTIVE:   SUBJECTIVE STATEMENT: "My shoulder has been hurting real bad at night"  PERTINENT HISTORY: Pt presents to OT s/p R rotator cuff scope due to R rotator cuff tear  PRECAUTIONS: Shoulder  WEIGHT BEARING RESTRICTIONS: No  PAIN:  Are you having pain? Yes: NPRS scale: 5/10 Pain location: anterior shoulder girdle Pain description: achy and sore Aggravating factors: movement Relieving factors: rest  FALLS: Has patient fallen in last 6 months? No  PATIENT GOALS: To relieve pain and get my shoulder moving better  NEXT MD VISIT: 01/20/23  OBJECTIVE:   HAND DOMINANCE: Right  ADLs: Overall ADLs: Pt reports increased difficulty with bathing her UB and dressing, Unable to clean the house at this time and difficulty with  holding up pots and pans.  FUNCTIONAL OUTCOME MEASURES: FOTO: 40.70 12/24/22: 57.36  UPPER EXTREMITY ROM:       Assessed supine, er/IR adducted    Passive ROM Right eval Right 12/04/22  Shoulder flexion  120  Shoulder abduction  98  Shoulder internal rotation  90  Shoulder external rotation  42  (Blank rows = not tested)  Assessed seated, er/IR adducted  Active ROM Right eval Right 12/04/22 Right 12/24/22  Shoulder flexion 57 100 118  Shoulder abduction 53 69 132  Shoulder internal rotation 90 90 90  Shoulder external rotation 47 54 57  (Blank rows = not tested)   UPPER  EXTREMITY MMT:       Assessed seated, er/IR adducted  MMT Right eval Right 12/04/22 Right 12/24/22  Shoulder flexion 3+/5 3+/5 4/5  Shoulder abduction 4-/5 3+/5 3+/5  Shoulder adduction 4/5 4/5 4-/5  Shoulder extension 4/5 4/5 5/5  Shoulder internal rotation 4/5 5/5 5/5  Shoulder external rotation 4-/5 4+/5 4+/5  (Blank rows = not tested)  HAND FUNCTION: Grip strength: Right: 50 lbs; Left: 50 lbs  EDEMA: Pt reports swelling in the bicep region of RUE  OBSERVATIONS: Severe fascial restrictions in the biceps and trapezius   TODAY'S TREATMENT:                                                                                                                              DATE:  12/26/22 -A/ROM: seated, flexion, abduction, protraction, horizontal abduction, er/IR, x10 -Stretching: Posterior shoulder capsule, bicep stretch, er in neutral, er doorway, IR towel stretch, 5x10" -Shoulder strengthening: 1lb dumbbell, flexion, abduction, protraction, horizontal abduction, er/IR, x10 -Lacing: modified to a lower height -Pulleys: flexion, abduction, x60"  12/24/22 -AA/ROM: seated, 2lb dowel rod, flexion, abduction, protraction, horizontal abduction, er/IR, x10 -A/ROM: seated, flexion, abduction, protraction, horizontal abduction, er/IR, x10 -Shoulder strengthening: 1lb dumbbell, flexion, abduction, protraction, horizontal abduction, er/IR, x10 -Scapular Strengthening: red theraband, extension, retraction, rows, x10 -Ball Rolls: flexion on th wall x10 -UBE: level 1, 3.0+ KPH, 4' forward  12/12/22 -AA/ROM: seated, flexion, abduction, protraction, horizontal abduction, er/IR, x10 -A/ROM: seated, flexion, abduction, protraction, horizontal abduction, er/IR, x10 -Wall slides: flexion and abduction, x10 each -Wall Wash in flexion 60" -Pulleys: flexion and abduction, x60" -Scapular Strengthening: red theraband, extension, retraction, rows, x10    PATIENT EDUCATION: Education details:  A/ROM Person educated: Patient Education method: Consulting civil engineer, Media planner, and Handouts Education comprehension: verbalized understanding and returned demonstration  HOME EXERCISE PROGRAM: 2/2: Pendulums and Table Slides 2/6: Willeen Niece 2/12: Wall Slides 2/16: AA/ROM 2/29: Scapular Strengthening 3/12: Shoulder Strengthening w/ dumbbells 3/14: A/ROM  GOALS: Goals reviewed with patient? Yes  SHORT TERM GOALS: Target date: 12/13/22  Pt was provided and educated on a comprehensive HEP for RUE mobility during ADL completion.  Goal status: IN PROGRESS  2.  Pt will decrease pain to 3/10 in RUE in order to sleep for 3+ consecutive hours without waking due to pain.  Goal status: IN PROGRESS  3.  Pt will decrease fascial restrictions to minimal amounts or less in the RUE in order to improve overhead reaching.  Goal status: IN PROGRESS  4.  Pt will improve A/ROM in RUE to [facility] in order to improve overhead and behind the back reaching during dressing and bathing tasks.  Goal status: IN PROGRESS  5.  Pt will improve strength in RUE to 4+/5 in order to improve ability to lift during cooking and cleaning tasks.  Goal status: IN PROGRESS    ASSESSMENT:  CLINICAL IMPRESSION: Pt presenting this session with increased pain. She is unable to complete A/ROM past 60% of full ROM with rest breaks frequently due to pain. Pt is also having severe back pain this session limiting exercises to sitting to ensure less stress on the back. OT added lacing this session for shoulder endurance and pulley's to assist with stretching her ROM closer to end ranges. Verbal and tactile cuing provided this session for positioning and technique.    PERFORMANCE DEFICITS: in functional skills including ADLs, IADLs, ROM, strength, pain, fascial restrictions, Gross motor control, body mechanics, and UE functional use.    PLAN:  OT FREQUENCY: 2x/week  OT DURATION: 4 weeks  PLANNED INTERVENTIONS: self care/ADL  training, therapeutic exercise, therapeutic activity, manual therapy, passive range of motion, functional mobility training, electrical stimulation, ultrasound, moist heat, cryotherapy, patient/family education, energy conservation, coping strategies training, and DME and/or AE instructions  RECOMMENDED OTHER SERVICES: N/A  CONSULTED AND AGREED WITH PLAN OF CARE: Patient  PLAN FOR NEXT SESSION: Manual Therapy, P/ROM, AA/ROM, Isometrics, wall slides, proximal shoulder strengthening, scapular strengthening, A/ROM   Paulita Fujita, OTR/L [phone] 12/26/2022, 9:07 AM

## 2022-12-26 NOTE — Patient Instructions (Signed)

## 2022-12-31 ENCOUNTER — Encounter (HOSPITAL_COMMUNITY): Payer: Self-pay | Admitting: Occupational Therapy

## 2022-12-31 ENCOUNTER — Ambulatory Visit (HOSPITAL_COMMUNITY): Payer: Medicare Other | Admitting: Occupational Therapy

## 2022-12-31 DIAGNOSIS — M25511 Pain in right shoulder: Secondary | ICD-10-CM | POA: Diagnosis not present

## 2022-12-31 DIAGNOSIS — M25611 Stiffness of right shoulder, not elsewhere classified: Secondary | ICD-10-CM

## 2022-12-31 DIAGNOSIS — R29898 Other symptoms and signs involving the musculoskeletal system: Secondary | ICD-10-CM

## 2022-12-31 NOTE — Patient Instructions (Signed)
Complete _______ repetitions each. Complete _______ time a day.  Wall taps with theraband  With a looped elastic band around forearms/wrists and arms at a 90 angle pressed against the wall. Keeping elbows on the wall, tap right arm out to the right. Hold for 1 second. Return right arm back to neutral. Repeat with left arm.    Wall V slides with Theraband  Place a band loop around hands/forearms and face a wall. Extend both arms diagonally into a V shape on the wall. Hold this stretch for specified amount of time. Lower arms slowly back into neutral position. Repeat.       scap clocks  Tie a loop with a theraband and place around your wrists.  Stand with a wall in front of you.  Picture a clock in front of you.  Place both palms on the wall, arms straight.  While keeping the left/right hand planted, use the right/left hand to pull away and tap each number (1, 3, 5- right OR 11,9,7 left), coming back to center each time.    

## 2022-12-31 NOTE — Therapy (Signed)
OUTPATIENT OCCUPATIONAL THERAPY ORTHO TREATMENT NOTE    Patient Name: ALEXANDER CRITCHLEY MRN: [id number] DOB:[birth year], 72 y.o., female Today's Date: 12/31/2022  PCP: Haywood Pao, MD REFERRING PROVIDER: Hessie Dibble., MD  END OF SESSION:  OT End of Session - 12/31/22 0917     Visit Number 9    Number of Visits 9    Date for OT Re-Evaluation 01/03/23    Authorization Type [employer] Medicare    Progress Note Due on Visit 10    OT Start Time 0915    OT Stop Time 0945    OT Time Calculation (min) 30 min    Activity Tolerance Patient tolerated treatment well    Behavior During Therapy WFL for tasks assessed/performed              Past Medical History:  Diagnosis Date   Arthritis    back, hips, knees   Cancer ([place]) 1978   cervical   CHF (congestive heart failure) ([place])    Phreesia 11/28/2020   Hyperlipidemia 12/01/2020   Hypertension    Pneumothorax 1977   PONV (postoperative nausea and vomiting)    Past Surgical History:  Procedure Laterality Date   BREAST BIOPSY Left    approx @ 48 benign    collapsed lung on right      [place]     age 70   HIP SURGERY  2007   right   JOINT REPLACEMENT  2007   TOTAL HIP ARTHROPLASTY Left 07/25/2020   Procedure: LEFT TOTAL HIP ARTHROPLASTY ANTERIOR APPROACH;  Surgeon: Melrose Nakayama, MD;  Location: [facility];  Service: Orthopedics;  Laterality: Left;   TOTAL KNEE ARTHROPLASTY Left 12/18/2021   Procedure: LEFT TOTAL KNEE ARTHROPLASTY;  Surgeon: Melrose Nakayama, MD;  Location: [facility];  Service: Orthopedics;  Laterality: Left;   Patient Active Problem List   Diagnosis Date Noted   Stage 3a chronic kidney disease ([place]) 10/05/2021   Muscle spasm 02/06/2021   Insomnia, unspecified 12/01/2020   Hyperlipidemia 12/01/2020   Chronic diastolic heart failure ([place]) 12/01/2020   Leg edema 12/01/2020   Primary localized osteoarthritis of left hip 07/25/2020   Tubular adenoma of colon 12/11/2016   Dyspnea 12/11/2016    Vitamin D deficiency 11/21/2016   Pre-diabetes 11/21/2016   Essential hypertension 11/19/2016   Depression, recurrent ([place]) 11/19/2016   Osteoarthritis 11/19/2016   Chronic back pain greater than 3 months duration 11/19/2016   History of cervical dysplasia 11/19/2016   Morbid obesity ([place]) 11/19/2016    ONSET DATE: 09/20/23  REFERRING DIAG: R Rotator Cuff Tear  THERAPY DIAG:  Acute pain of right shoulder  Shoulder stiffness, right  Other symptoms and signs involving the musculoskeletal system  Rationale for Evaluation and Treatment: Rehabilitation  SUBJECTIVE:   SUBJECTIVE STATEMENT: "My shoulder has been hurting real bad at night"  PERTINENT HISTORY: Pt presents to OT s/p R rotator cuff scope due to R rotator cuff tear  PRECAUTIONS: Shoulder  WEIGHT BEARING RESTRICTIONS: No  PAIN:  Are you having pain? Yes: NPRS scale: 5/10 Pain location: anterior shoulder girdle Pain description: achy and sore Aggravating factors: movement Relieving factors: rest  FALLS: Has patient fallen in last 6 months? No  PATIENT GOALS: To relieve pain and get my shoulder moving better  NEXT MD VISIT: 01/20/23  OBJECTIVE:   HAND DOMINANCE: Right  ADLs: Overall ADLs: Pt reports increased difficulty with bathing her UB and dressing, Unable to clean the house at this time and difficulty with  holding up pots and pans.  FUNCTIONAL OUTCOME MEASURES: FOTO: 40.70 12/24/22: 57.36 12/31/22: 66.90  UPPER EXTREMITY ROM:       Assessed supine, er/IR adducted    Passive ROM Right eval Right 12/04/22  Shoulder flexion  120  Shoulder abduction  98  Shoulder internal rotation  90  Shoulder external rotation  42  (Blank rows = not tested)  Assessed seated, er/IR adducted  Active ROM Right eval Right 12/04/22 Right 12/24/22 Right 12/31/22  Shoulder flexion 57 100 118 126  Shoulder abduction 53 69 132 143  Shoulder internal rotation 90 90 90 90  Shoulder external rotation 47 54 57 68   (Blank rows = not tested)   UPPER EXTREMITY MMT:       Assessed seated, er/IR adducted  MMT Right eval Right 12/04/22 Right 12/24/22 Right 12/31/22  Shoulder flexion 3+/5 3+/5 4/5 4+/5  Shoulder abduction 4-/5 3+/5 3+/5 4-/5  Shoulder adduction 4/5 4/5 4-/5 4+/5  Shoulder extension 4/5 4/5 5/5 5/5  Shoulder internal rotation 4/5 5/5 5/5 5/5  Shoulder external rotation 4-/5 4+/5 4+/5 4+/5  (Blank rows = not tested)  HAND FUNCTION: Grip strength: Right: 50 lbs; Left: 50 lbs  EDEMA: Pt reports swelling in the bicep region of RUE  OBSERVATIONS: Severe fascial restrictions in the biceps and trapezius   TODAY'S TREATMENT:                                                                                                                              DATE:   12/31/22 -A/ROM: seated, flexion, abduction, protraction, horizontal abduction, er/IR, x10 -Wall Slides: flexion, abduction, x10 -Loop Band Exercises: Wall taps, V ups, Wall Clocks, x10  12/26/22 -A/ROM: seated, flexion, abduction, protraction, horizontal abduction, er/IR, x10 -Stretching: Posterior shoulder capsule, bicep stretch, er in neutral, er doorway, IR towel stretch, 5x10" -Shoulder strengthening: 1lb dumbbell, flexion, abduction, protraction, horizontal abduction, er/IR, x10 -Lacing: modified to a lower height -Pulleys: flexion, abduction, x60"  12/24/22 -AA/ROM: seated, 2lb dowel rod, flexion, abduction, protraction, horizontal abduction, er/IR, x10 -A/ROM: seated, flexion, abduction, protraction, horizontal abduction, er/IR, x10 -Shoulder strengthening: 1lb dumbbell, flexion, abduction, protraction, horizontal abduction, er/IR, x10 -Scapular Strengthening: red theraband, extension, retraction, rows, x10 -Ball Rolls: flexion on th wall x10 -UBE: level 1, 3.0+ KPH, 4' forward    PATIENT EDUCATION: Education details: Loop Band Exercises Person educated: Patient Education method: Explanation, Demonstration, and  Handouts Education comprehension: verbalized understanding and returned demonstration  HOME EXERCISE PROGRAM: 2/2: Pendulums and Table Slides 2/6: Willeen Niece 2/12: Wall Slides 2/16: AA/ROM 2/29: Scapular Strengthening 3/12: Shoulder Strengthening w/ dumbbells 3/14: A/ROM 3/19: Loop Band Exercises  GOALS: Goals reviewed with patient? Yes  SHORT TERM GOALS: Target date: 12/13/22  Pt was provided and educated on a comprehensive HEP for RUE mobility during ADL completion.  Goal status: IN PROGRESS  2.  Pt will decrease pain to 3/10 in RUE in order to sleep for 3+ consecutive hours without waking  due to pain.  Goal status: IN PROGRESS  3.  Pt will decrease fascial restrictions to minimal amounts or less in the RUE in order to improve overhead reaching.  Goal status: IN PROGRESS  4.  Pt will improve A/ROM in RUE to [facility] in order to improve overhead and behind the back reaching during dressing and bathing tasks.  Goal status: IN PROGRESS  5.  Pt will improve strength in RUE to 4+/5 in order to improve ability to lift during cooking and cleaning tasks.  Goal status: IN PROGRESS    ASSESSMENT:  CLINICAL IMPRESSION: Pt arrived to session 15 mins late. She reported that her pain has been feeling better. This session OT and pt completed a reassessment where she is demonstrating good improvement with ROM and strength. She continues to have mild to moderate pain along her bicep and deltoid, especially during abduction related tasks. OT added loop band exercises to continue working on progressing strength, pt requiring increased time during wall clocks due to pain in her bicep and deltoid. Verbal and tactile cuing provided for positioning and technique.    PERFORMANCE DEFICITS: in functional skills including ADLs, IADLs, ROM, strength, pain, fascial restrictions, Gross motor control, body mechanics, and UE functional use.    PLAN:  OT FREQUENCY: 2x/week  OT DURATION: 4 weeks  PLANNED  INTERVENTIONS: self care/ADL training, therapeutic exercise, therapeutic activity, manual therapy, passive range of motion, functional mobility training, electrical stimulation, ultrasound, moist heat, cryotherapy, patient/family education, energy conservation, coping strategies training, and DME and/or AE instructions  RECOMMENDED OTHER SERVICES: N/A  CONSULTED AND AGREED WITH PLAN OF CARE: Patient  PLAN FOR NEXT SESSION: Manual Therapy, P/ROM, AA/ROM, Isometrics, wall slides, proximal shoulder strengthening, scapular strengthening, A/ROM   Paulita Fujita, OTR/L [phone] 12/31/2022, 9:19 AM

## 2023-01-02 ENCOUNTER — Encounter (HOSPITAL_COMMUNITY): Payer: Medicare Other | Admitting: Occupational Therapy

## 2023-01-07 ENCOUNTER — Ambulatory Visit (HOSPITAL_COMMUNITY): Payer: Medicare Other | Admitting: Occupational Therapy

## 2023-01-07 DIAGNOSIS — M25611 Stiffness of right shoulder, not elsewhere classified: Secondary | ICD-10-CM

## 2023-01-07 DIAGNOSIS — R29898 Other symptoms and signs involving the musculoskeletal system: Secondary | ICD-10-CM

## 2023-01-07 DIAGNOSIS — M25511 Pain in right shoulder: Secondary | ICD-10-CM | POA: Diagnosis not present

## 2023-01-07 NOTE — Therapy (Signed)
OUTPATIENT OCCUPATIONAL THERAPY ORTHO TREATMENT NOTE    Patient Name: Sarah Cooper MRN: [id number] DOB:1951-07-16, 72 y.o., female Today's Date: 01/07/2023  PCP: Haywood Pao, MD REFERRING PROVIDER: Hessie Dibble., MD  END OF SESSION:  OT End of Session - 01/07/23 0913     Visit Number 10    Number of Visits 13    Date for OT Re-Evaluation 01/03/23    Authorization Type [employer] Medicare    OT Start Time 0910    OT Stop Time 0945    OT Time Calculation (min) 35 min    Activity Tolerance Patient tolerated treatment well    Behavior During Therapy [facility] for tasks assessed/performed            *Pt arrived 10 mins late  Past Medical History:  Diagnosis Date   Arthritis    back, hips, knees   Cancer ([place]) 1978   cervical   CHF (congestive heart failure) ([place])    Phreesia 11/28/2020   Hyperlipidemia 12/01/2020   Hypertension    Pneumothorax 1977   PONV (postoperative nausea and vomiting)    Past Surgical History:  Procedure Laterality Date   BREAST BIOPSY Left    approx @ 48 benign    collapsed lung on right      [place]     age 53   HIP SURGERY  2007   right   JOINT REPLACEMENT  2007   TOTAL HIP ARTHROPLASTY Left 07/25/2020   Procedure: LEFT TOTAL HIP ARTHROPLASTY ANTERIOR APPROACH;  Surgeon: Melrose Nakayama, MD;  Location: [facility];  Service: Orthopedics;  Laterality: Left;   TOTAL KNEE ARTHROPLASTY Left 12/18/2021   Procedure: LEFT TOTAL KNEE ARTHROPLASTY;  Surgeon: Melrose Nakayama, MD;  Location: [facility];  Service: Orthopedics;  Laterality: Left;   Patient Active Problem List   Diagnosis Date Noted   Stage 3a chronic kidney disease ([place]) 10/05/2021   Muscle spasm 02/06/2021   Insomnia, unspecified 12/01/2020   Hyperlipidemia 12/01/2020   Chronic diastolic heart failure ([place]) 12/01/2020   Leg edema 12/01/2020   Primary localized osteoarthritis of left hip 07/25/2020   Tubular adenoma of colon 12/11/2016   Dyspnea 12/11/2016    Vitamin D deficiency 11/21/2016   Pre-diabetes 11/21/2016   Essential hypertension 11/19/2016   Depression, recurrent ([place]) 11/19/2016   Osteoarthritis 11/19/2016   Chronic back pain greater than 3 months duration 11/19/2016   History of cervical dysplasia 11/19/2016   Morbid obesity ([place]) 11/19/2016    ONSET DATE: 09/20/23  REFERRING DIAG: R Rotator Cuff Tear  THERAPY DIAG:  Acute pain of right shoulder  Shoulder stiffness, right  Other symptoms and signs involving the musculoskeletal system  Rationale for Evaluation and Treatment: Rehabilitation  SUBJECTIVE:   SUBJECTIVE STATEMENT: "It's been a rough week, but I'm glad I'm only coming here 1 time a week."  PERTINENT HISTORY: Pt presents to OT s/p R rotator cuff scope due to R rotator cuff tear  PRECAUTIONS: Shoulder  WEIGHT BEARING RESTRICTIONS: No  PAIN:  Are you having pain? Yes: NPRS scale: 5/10 Pain location: anterior shoulder girdle Pain description: achy and sore Aggravating factors: movement Relieving factors: rest  FALLS: Has patient fallen in last 6 months? No  PATIENT GOALS: To relieve pain and get my shoulder moving better  NEXT MD VISIT: 01/20/23  OBJECTIVE:   HAND DOMINANCE: Right  ADLs: Overall ADLs: Pt reports increased difficulty with bathing her UB and dressing, Unable to clean the house at this time and  difficulty with holding up pots and pans.  FUNCTIONAL OUTCOME MEASURES: FOTO: 40.70 12/24/22: 57.36 12/31/22: 66.90  UPPER EXTREMITY ROM:       Assessed supine, er/IR adducted    Passive ROM Right eval Right 12/04/22  Shoulder flexion  120  Shoulder abduction  98  Shoulder internal rotation  90  Shoulder external rotation  42  (Blank rows = not tested)  Assessed seated, er/IR adducted  Active ROM Right eval Right 12/04/22 Right 12/24/22 Right 12/31/22  Shoulder flexion 57 100 118 126  Shoulder abduction 53 69 132 143  Shoulder internal rotation 90 90 90 90  Shoulder external  rotation 47 54 57 68  (Blank rows = not tested)   UPPER EXTREMITY MMT:       Assessed seated, er/IR adducted  MMT Right eval Right 12/04/22 Right 12/24/22 Right 12/31/22  Shoulder flexion 3+/5 3+/5 4/5 4+/5  Shoulder abduction 4-/5 3+/5 3+/5 4-/5  Shoulder adduction 4/5 4/5 4-/5 4+/5  Shoulder extension 4/5 4/5 5/5 5/5  Shoulder internal rotation 4/5 5/5 5/5 5/5  Shoulder external rotation 4-/5 4+/5 4+/5 4+/5  (Blank rows = not tested)  HAND FUNCTION: Grip strength: Right: 50 lbs; Left: 50 lbs  EDEMA: Pt reports swelling in the bicep region of RUE  OBSERVATIONS: Severe fascial restrictions in the biceps and trapezius   TODAY'S TREATMENT:                                                                                                                              DATE:   01/07/23 -A/ROM: seated, flexion, abduction, protraction, horizontal abduction, er/IR, x10 -Proximal Shoulder: arms on fire x60" -Shoulder Strengthening: flexion (2lb), abduction (1lb), Protraction (2lb), horizontal abduction (1lb), er/IR (2lb), x10 -Loop Band Exercises: green theraband, Wall taps, V ups, Wall Clocks, x10 -PNF Strengthening: red theraband, chest pulls, er pulls, PNF up, PNF down, x10  12/31/22 -A/ROM: seated, flexion, abduction, protraction, horizontal abduction, er/IR, x10 -Wall Slides: flexion, abduction, x10 -Loop Band Exercises: Wall taps, V ups, Wall Clocks, x10  12/26/22 -A/ROM: seated, flexion, abduction, protraction, horizontal abduction, er/IR, x10 -Stretching: Posterior shoulder capsule, bicep stretch, er in neutral, er doorway, IR towel stretch, 5x10" -Shoulder strengthening: 1lb dumbbell, flexion, abduction, protraction, horizontal abduction, er/IR, x10 -Lacing: modified to a lower height -Pulleys: flexion, abduction, x60"  PATIENT EDUCATION: Education details: PNF Strengthening Person educated: Patient Education method: Explanation, Demonstration, and Handouts Education  comprehension: verbalized understanding and returned demonstration  HOME EXERCISE PROGRAM: 2/2: Pendulums and Table Slides 2/6: Willeen Niece 2/12: Wall Slides 2/16: AA/ROM 2/29: Scapular Strengthening 3/12: Shoulder Strengthening w/ dumbbells 3/14: A/ROM 3/19: Loop Band Exercises 3/26: PNF Strengthening  GOALS: Goals reviewed with patient? Yes  SHORT TERM GOALS: Target date: 12/13/22  Pt was provided and educated on a comprehensive HEP for RUE mobility during ADL completion.  Goal status: IN PROGRESS  2.  Pt will decrease pain to 3/10 in RUE in order to sleep for 3+ consecutive hours  without waking due to pain.  Goal status: IN PROGRESS  3.  Pt will decrease fascial restrictions to minimal amounts or less in the RUE in order to improve overhead reaching.  Goal status: IN PROGRESS  4.  Pt will improve A/ROM in RUE to [facility] in order to improve overhead and behind the back reaching during dressing and bathing tasks.  Goal status: IN PROGRESS  5.  Pt will improve strength in RUE to 4+/5 in order to improve ability to lift during cooking and cleaning tasks.  Goal status: IN PROGRESS    ASSESSMENT:  CLINICAL IMPRESSION: This session, despite complaints of pain, pt strength and ROM improving. This session OT progressed pt to 2lb dumbbells for most shoulder strengthening, as well as increasing the resistance band to green for loop band exercises and completing PNF strengthening. Due to pain she continues to require increased time and rest breaks. OT providing verbal, visual, and tactile cuing throughout session for positioning and technique.    PERFORMANCE DEFICITS: in functional skills including ADLs, IADLs, ROM, strength, pain, fascial restrictions, Gross motor control, body mechanics, and UE functional use.    PLAN:  OT FREQUENCY: 2x/week  OT DURATION: 4 weeks  PLANNED INTERVENTIONS: self care/ADL training, therapeutic exercise, therapeutic activity, manual therapy, passive  range of motion, functional mobility training, electrical stimulation, ultrasound, moist heat, cryotherapy, patient/family education, energy conservation, coping strategies training, and DME and/or AE instructions  RECOMMENDED OTHER SERVICES: N/A  CONSULTED AND AGREED WITH PLAN OF CARE: Patient  PLAN FOR NEXT SESSION: Manual Therapy, P/ROM, AA/ROM, Isometrics, wall slides, proximal shoulder strengthening, scapular strengthening, A/ROM   Paulita Fujita, OTR/L [phone] 01/07/2023, 9:14 AM

## 2023-01-07 NOTE — Patient Instructions (Signed)

## 2023-01-09 ENCOUNTER — Encounter (HOSPITAL_COMMUNITY): Payer: Medicare Other | Admitting: Occupational Therapy

## 2023-01-09 NOTE — Addendum Note (Signed)
Addended by: Marliss Coots on: 01/09/2023 09:39 AM   Modules accepted: Orders

## 2023-01-16 ENCOUNTER — Encounter (HOSPITAL_COMMUNITY): Payer: Self-pay | Admitting: Occupational Therapy

## 2023-01-16 ENCOUNTER — Ambulatory Visit (HOSPITAL_COMMUNITY): Payer: Medicare Other | Attending: Internal Medicine | Admitting: Occupational Therapy

## 2023-01-16 DIAGNOSIS — M25611 Stiffness of right shoulder, not elsewhere classified: Secondary | ICD-10-CM | POA: Diagnosis present

## 2023-01-16 DIAGNOSIS — M25511 Pain in right shoulder: Secondary | ICD-10-CM | POA: Insufficient documentation

## 2023-01-16 DIAGNOSIS — R29898 Other symptoms and signs involving the musculoskeletal system: Secondary | ICD-10-CM | POA: Insufficient documentation

## 2023-01-16 NOTE — Therapy (Signed)
OUTPATIENT OCCUPATIONAL THERAPY ORTHO TREATMENT NOTE    Patient Name: Sarah Cooper MRN: [id number] DOB:1951-02-20, 72 y.o., female Today's Date: 01/16/2023  PCP: Gaspar Garbe, MD REFERRING PROVIDER: Velna Ochs., MD  END OF SESSION:  OT End of Session - 01/16/23 1116     Number of Visits 13    Date for OT Re-Evaluation 01/31/23    Authorization Type [employer]    OT Start Time 1115    OT Stop Time 1155    OT Time Calculation (min) 40 min    Activity Tolerance Patient tolerated treatment well    Behavior During Therapy WFL for tasks assessed/performed             Past Medical History:  Diagnosis Date   Arthritis    back, hips, knees   Cancer 1978   cervical   CHF (congestive heart failure)    [employer]reesia 11/28/2020   Hyperlipidemia 12/01/2020   Hypertension    Pneumothorax 1977   PONV (postoperative nausea and vomiting)    Past Surgical History:  Procedure Laterality Date   BREAST BIOPSY Left    approx @ 48 benign    collapsed lung on right      1970s   HERNIA REPAIR     age 19   HIP SURGERY  2007   right   JOINT REPLACEMENT  2007   TOTAL HIP ARTHROPLASTY Left 07/25/2020   Procedure: LEFT TOTAL HIP ARTHROPLASTY ANTERIOR APPROACH;  Surgeon: Marcene Corning, MD;  Location: [facility];  Service: Orthopedics;  Laterality: Left;   TOTAL KNEE ARTHROPLASTY Left 12/18/2021   Procedure: LEFT TOTAL KNEE ARTHROPLASTY;  Surgeon: Marcene Corning, MD;  Location: [facility];  Service: Orthopedics;  Laterality: Left;   Patient Active Problem List   Diagnosis Date Noted   Stage 3a chronic kidney disease 10/05/2021   Muscle spasm 02/06/2021   Insomnia, unspecified 12/01/2020   Hyperlipidemia 12/01/2020   Chronic diastolic heart failure 12/01/2020   Leg edema 12/01/2020   Primary localized osteoarthritis of left hip 07/25/2020   Tubular adenoma of colon 12/11/2016   Dyspnea 12/11/2016   Vitamin D deficiency 11/21/2016   Pre-diabetes 11/21/2016   Essential  hypertension 11/19/2016   Depression, recurrent 11/19/2016   Osteoarthritis 11/19/2016   Chronic back pain greater than 3 months duration 11/19/2016   History of cervical dysplasia 11/19/2016   Morbid obesity 11/19/2016    ONSET DATE: 09/20/23  REFERRING DIAG: R Rotator Cuff Tear  THERAPY DIAG:  Acute pain of right shoulder  Shoulder stiffness, right  Other symptoms and signs involving the musculoskeletal system  Rationale for Evaluation and Treatment: Rehabilitation  SUBJECTIVE:   SUBJECTIVE STATEMENT: "I did something to my shoulder last week"  PERTINENT HISTORY: Pt presents to OT s/p R rotator cuff scope due to R rotator cuff tear  PRECAUTIONS: Shoulder  WEIGHT BEARING RESTRICTIONS: No  PAIN:  Are you having pain? Yes: NPRS scale: 5/10 Pain location: anterior shoulder girdle Pain description: achy and sore Aggravating factors: movement Relieving factors: rest  FALLS: Has patient fallen in last 6 months? No  PATIENT GOALS: To relieve pain and get my shoulder moving better  NEXT MD VISIT: 01/20/23  OBJECTIVE:   HAND DOMINANCE: Right  ADLs: Overall ADLs: Pt reports increased difficulty with bathing her UB and dressing, Unable to clean the house at this time and difficulty with holding up pots and pans.  FUNCTIONAL OUTCOME MEASURES: FOTO: 40.70 12/24/22: 57.36 12/31/22: 66.90  UPPER EXTREMITY ROM:  Assessed supine, er/IR adducted    Passive ROM Right eval Right 12/04/22  Shoulder flexion  120  Shoulder abduction  98  Shoulder internal rotation  90  Shoulder external rotation  42  (Blank rows = not tested)  Assessed seated, er/IR adducted  Active ROM Right eval Right 12/04/22 Right 12/24/22 Right 12/31/22  Shoulder flexion 57 100 118 126  Shoulder abduction 53 69 132 143  Shoulder internal rotation 90 90 90 90  Shoulder external rotation 47 54 57 68  (Blank rows = not tested)   UPPER EXTREMITY MMT:       Assessed seated, er/IR  adducted  MMT Right eval Right 12/04/22 Right 12/24/22 Right 12/31/22  Shoulder flexion 3+/5 3+/5 4/5 4+/5  Shoulder abduction 4-/5 3+/5 3+/5 4-/5  Shoulder adduction 4/5 4/5 4-/5 4+/5  Shoulder extension 4/5 4/5 5/5 5/5  Shoulder internal rotation 4/5 5/5 5/5 5/5  Shoulder external rotation 4-/5 4+/5 4+/5 4+/5  (Blank rows = not tested)  HAND FUNCTION: Grip strength: Right: 50 lbs; Left: 50 lbs  EDEMA: Pt reports swelling in the bicep region of RUE  OBSERVATIONS: Severe fascial restrictions in the biceps and trapezius   TODAY'S TREATMENT:                                                                                                                              DATE:   01/16/23 -Manual therapy: myofascial release and trigger point massage applied to biceps, trapezius, scapular region, and axillary region in order to reduce fascial restrictions and pain, as well as improving ROM. -AA/ROM: supine, flexion, protraction, horizontal abduction, er/IR, x10 -PVC Pipe Slide: flexion to abduction x10 -Wall Wash 60" - less than 90 degrees flexion -UBE, level 1, 2' forwards and backwards  01/07/23 -A/ROM: seated, flexion, abduction, protraction, horizontal abduction, er/IR, x10 -Proximal Shoulder: arms on fire x60" -Shoulder Strengthening: flexion (2lb), abduction (1lb), Protraction (2lb), horizontal abduction (1lb), er/IR (2lb), x10 -Loop Band Exercises: green theraband, Wall taps, V ups, Wall Clocks, x10 -PNF Strengthening: red theraband, chest pulls, er pulls, PNF up, PNF down, x10  12/31/22 -A/ROM: seated, flexion, abduction, protraction, horizontal abduction, er/IR, x10 -Wall Slides: flexion, abduction, x10 -Loop Band Exercises: Wall taps, V ups, Wall Clocks, x10  PATIENT EDUCATION: Education details: Reviewing HEP Person educated: Patient Education method: Explanation, Demonstration, and Handouts Education comprehension: verbalized understanding and returned demonstration  HOME  EXERCISE PROGRAM: 2/2: Pendulums and Table Slides 2/6: Shelly Bombard 2/12: Wall Slides 2/16: AA/ROM 2/29: Scapular Strengthening 3/12: Shoulder Strengthening w/ dumbbells 3/14: A/ROM 3/19: Loop Band Exercises 3/26: PNF Strengthening  GOALS: Goals reviewed with patient? Yes  SHORT TERM GOALS: Target date: 12/13/22  Pt was provided and educated on a comprehensive HEP for RUE mobility during ADL completion.  Goal status: IN PROGRESS  2.  Pt will decrease pain to 3/10 in RUE in order to sleep for 3+ consecutive hours without waking due to pain.  Goal status: IN PROGRESS  3.  Pt will decrease fascial restrictions to minimal amounts or less in the RUE in order to improve overhead reaching.  Goal status: IN PROGRESS  4.  Pt will improve A/ROM in RUE to [facility] in order to improve overhead and behind the back reaching during dressing and bathing tasks.  Goal status: IN PROGRESS  5.  Pt will improve strength in RUE to 4+/5 in order to improve ability to lift during cooking and cleaning tasks.  Goal status: IN PROGRESS    ASSESSMENT:  CLINICAL IMPRESSION: This session pt having increased pain and decreased ROM. OT started session with manual therapy due to pt's severe pain, she presented with severe fascial restrictions around the biceps, trapezius, and scapular region. She was unable to complete A/ROM, even in supine, OT had to reduce her back to AA/ROM, where she is only able to achieve approximately 50-60% of full ROM with increased pain levels. Pt also was unable to complete wall slides this session, therefore OT had her do PVC pipe slides down by her side up to approximately 90 degrees of flexion/abduction. OT requesting that pt follow up with her MD if pain and limitations continue. Verbal and tactile cuing provided for positioning and technique.    PERFORMANCE DEFICITS: in functional skills including ADLs, IADLs, ROM, strength, pain, fascial restrictions, Gross motor control, body  mechanics, and UE functional use.    PLAN:  OT FREQUENCY: 2x/week  OT DURATION: 4 weeks  PLANNED INTERVENTIONS: self care/ADL training, therapeutic exercise, therapeutic activity, manual therapy, passive range of motion, functional mobility training, electrical stimulation, ultrasound, moist heat, cryotherapy, patient/family education, energy conservation, coping strategies training, and DME and/or AE instructions  RECOMMENDED OTHER SERVICES: N/A  CONSULTED AND AGREED WITH PLAN OF CARE: Patient  PLAN FOR NEXT SESSION: Manual Therapy, P/ROM, AA/ROM, Isometrics, wall slides, proximal shoulder strengthening, scapular strengthening, A/ROM   Trish Mage, OTR/L [phone] 01/16/2023, 11:17 AM

## 2023-01-21 ENCOUNTER — Encounter (HOSPITAL_COMMUNITY): Payer: Self-pay | Admitting: Occupational Therapy

## 2023-01-21 ENCOUNTER — Ambulatory Visit (HOSPITAL_COMMUNITY): Payer: Medicare Other | Admitting: Occupational Therapy

## 2023-01-21 DIAGNOSIS — R29898 Other symptoms and signs involving the musculoskeletal system: Secondary | ICD-10-CM

## 2023-01-21 DIAGNOSIS — M25511 Pain in right shoulder: Secondary | ICD-10-CM

## 2023-01-21 DIAGNOSIS — M25611 Stiffness of right shoulder, not elsewhere classified: Secondary | ICD-10-CM

## 2023-01-21 NOTE — Therapy (Unsigned)
OUTPATIENT OCCUPATIONAL THERAPY ORTHO TREATMENT NOTE    Patient Name: Sarah Cooper MRN: [id number] DOB:[birth year], 72 y.o., female Today's Date: 01/21/2023  PCP: Gaspar Garbe, MD REFERRING PROVIDER: Velna Ochs., MD  END OF SESSION:  OT End of Session - 01/21/23 0945     Visit Number 12    Number of Visits 13    Date for OT Re-Evaluation 01/31/23    Authorization Type [employer] Medicare    OT Start Time 0945    OT Stop Time 1025    OT Time Calculation (min) 40 min    Activity Tolerance Patient tolerated treatment well    Behavior During Therapy WFL for tasks assessed/performed             Past Medical History:  Diagnosis Date   Arthritis    back, hips, knees   Cancer 1978   cervical   CHF (congestive heart failure)    [employer] 11/28/2020   Hyperlipidemia 12/01/2020   Hypertension    Pneumothorax 1977   PONV (postoperative nausea and vomiting)    Past Surgical History:  Procedure Laterality Date   BREAST BIOPSY Left    approx @ 48 benign    collapsed lung on right      1970s   HERNIA REPAIR     age 68   HIP SURGERY  2007   right   JOINT REPLACEMENT  2007   TOTAL HIP ARTHROPLASTY Left 07/25/2020   Procedure: LEFT TOTAL HIP ARTHROPLASTY ANTERIOR APPROACH;  Surgeon: Marcene Corning, MD;  Location: [facility];  Service: Orthopedics;  Laterality: Left;   TOTAL KNEE ARTHROPLASTY Left 12/18/2021   Procedure: LEFT TOTAL KNEE ARTHROPLASTY;  Surgeon: Marcene Corning, MD;  Location: [facility];  Service: Orthopedics;  Laterality: Left;   Patient Active Problem List   Diagnosis Date Noted   Stage 3a chronic kidney disease 10/05/2021   Muscle spasm 02/06/2021   Insomnia, unspecified 12/01/2020   Hyperlipidemia 12/01/2020   Chronic diastolic heart failure 12/01/2020   Leg edema 12/01/2020   Primary localized osteoarthritis of left hip 07/25/2020   Tubular adenoma of colon 12/11/2016   Dyspnea 12/11/2016   Vitamin D deficiency 11/21/2016   Pre-diabetes  11/21/2016   Essential hypertension 11/19/2016   Depression, recurrent 11/19/2016   Osteoarthritis 11/19/2016   Chronic back pain greater than 3 months duration 11/19/2016   History of cervical dysplasia 11/19/2016   Morbid obesity 11/19/2016    ONSET DATE: 09/20/23  REFERRING DIAG: R Rotator Cuff Tear  THERAPY DIAG:  Acute pain of right shoulder  Shoulder stiffness, right  Other symptoms and signs involving the musculoskeletal system  Rationale for Evaluation and Treatment: Rehabilitation  SUBJECTIVE:   SUBJECTIVE STATEMENT: "My shoulder feels like it's not attached."  PERTINENT HISTORY: Pt presents to OT s/p R rotator cuff scope due to R rotator cuff tear  PRECAUTIONS: Shoulder  WEIGHT BEARING RESTRICTIONS: No  PAIN:  Are you having pain? Yes: NPRS scale: 7/10 Pain location: anterior shoulder girdle Pain description: achy and sore Aggravating factors: movement Relieving factors: rest  FALLS: Has patient fallen in last 6 months? No  PATIENT GOALS: To relieve pain and get my shoulder moving better  NEXT MD VISIT: 01/20/23  OBJECTIVE:   HAND DOMINANCE: Right  ADLs: Overall ADLs: Pt reports increased difficulty with bathing her UB and dressing, Unable to clean the house at this time and difficulty with holding up pots and pans.  FUNCTIONAL OUTCOME MEASURES: FOTO: 40.70 12/24/22: 57.36 12/31/22: 66.90  UPPER EXTREMITY  ROM:       Assessed supine, er/IR adducted    Passive ROM Right eval Right 12/04/22  Shoulder flexion  120  Shoulder abduction  98  Shoulder internal rotation  90  Shoulder external rotation  42  (Blank rows = not tested)  Assessed seated, er/IR adducted  Active ROM Right eval Right 12/04/22 Right 12/24/22 Right 12/31/22  Shoulder flexion 57 100 118 126  Shoulder abduction 53 69 132 143  Shoulder internal rotation 90 90 90 90  Shoulder external rotation 47 54 57 68  (Blank rows = not tested)   UPPER EXTREMITY MMT:       Assessed  seated, er/IR adducted  MMT Right eval Right 12/04/22 Right 12/24/22 Right 12/31/22  Shoulder flexion 3+/5 3+/5 4/5 4+/5  Shoulder abduction 4-/5 3+/5 3+/5 4-/5  Shoulder adduction 4/5 4/5 4-/5 4+/5  Shoulder extension 4/5 4/5 5/5 5/5  Shoulder internal rotation 4/5 5/5 5/5 5/5  Shoulder external rotation 4-/5 4+/5 4+/5 4+/5  (Blank rows = not tested)  HAND FUNCTION: Grip strength: Right: 50 lbs; Left: 50 lbs  EDEMA: Pt reports swelling in the bicep region of RUE  OBSERVATIONS: Severe fascial restrictions in the biceps and trapezius   TODAY'S TREATMENT:                                                                                                                              DATE:   01/21/23 -Manual therapy: myofascial release and trigger point massage applied to biceps, trapezius, scapular region, and axillary region in order to reduce fascial restrictions and pain, as well as improving ROM. -AA/ROM: supine, flexion, abduction, protraction, horizontal abduction, er/IR, x10 -A/ROM: supine, flexion, protraction, horizontal abduction, er/IR, x10 -Ball Rolls: flexion, abduction, x10 -wall slides: low level 80-110 degrees flexion, x10 -UBE, level 1, 2' forwards and backwards  01/16/23 -Manual therapy: myofascial release and trigger point massage applied to biceps, trapezius, scapular region, and axillary region in order to reduce fascial restrictions and pain, as well as improving ROM. -AA/ROM: supine, flexion, protraction, horizontal abduction, er/IR, x10 -PVC Pipe Slide: flexion to abduction x10 -Wall Wash 60" - less than 90 degrees flexion -UBE, level 1, 2' forwards and backwards  01/07/23 -A/ROM: seated, flexion, abduction, protraction, horizontal abduction, er/IR, x10 -Proximal Shoulder: arms on fire x60" -Shoulder Strengthening: flexion (2lb), abduction (1lb), Protraction (2lb), horizontal abduction (1lb), er/IR (2lb), x10 -Loop Band Exercises: green theraband, Wall taps, V  ups, Wall Clocks, x10 -PNF Strengthening: red theraband, chest pulls, er pulls, PNF up, PNF down, x10   PATIENT EDUCATION: Education details: Reviewing HEP Person educated: Patient Education method: Explanation, Demonstration, and Handouts Education comprehension: verbalized understanding and returned demonstration  HOME EXERCISE PROGRAM: 2/2: Pendulums and Table Slides 2/6: Shelly BombardBall Rolls 2/12: Wall Slides 2/16: AA/ROM 2/29: Scapular Strengthening 3/12: Shoulder Strengthening w/ dumbbells 3/14: A/ROM 3/19: Loop Band Exercises 3/26: PNF Strengthening  GOALS: Goals reviewed with patient? Yes  SHORT TERM GOALS: Target date: 12/13/22  Pt was provided and educated on a comprehensive HEP for RUE mobility during ADL completion.  Goal status: IN PROGRESS  2.  Pt will decrease pain to 3/10 in RUE in order to sleep for 3+ consecutive hours without waking due to pain.  Goal status: IN PROGRESS  3.  Pt will decrease fascial restrictions to minimal amounts or less in the RUE in order to improve overhead reaching.  Goal status: IN PROGRESS  4.  Pt will improve A/ROM in RUE to [facility] in order to improve overhead and behind the back reaching during dressing and bathing tasks.  Goal status: IN PROGRESS  5.  Pt will improve strength in RUE to 4+/5 in order to improve ability to lift during cooking and cleaning tasks.  Goal status: IN PROGRESS    ASSESSMENT:  CLINICAL IMPRESSION: This session pt having increased pain and decreased ROM. OT started session with manual therapy due to pt's severe pain, she presented with severe fascial restrictions around the biceps, trapezius, and scapular region. She was unable to complete A/ROM, even in supine, OT had to reduce her back to AA/ROM, where she is only able to achieve approximately 50-60% of full ROM with increased pain levels. Pt also was unable to complete wall slides this session, therefore OT had her do PVC pipe slides down by her side up to  approximately 90 degrees of flexion/abduction. OT requesting that pt follow up with her MD if pain and limitations continue. Verbal and tactile cuing provided for positioning and technique.    PERFORMANCE DEFICITS: in functional skills including ADLs, IADLs, ROM, strength, pain, fascial restrictions, Gross motor control, body mechanics, and UE functional use.    PLAN:  OT FREQUENCY: 2x/week  OT DURATION: 4 weeks  PLANNED INTERVENTIONS: self care/ADL training, therapeutic exercise, therapeutic activity, manual therapy, passive range of motion, functional mobility training, electrical stimulation, ultrasound, moist heat, cryotherapy, patient/family education, energy conservation, coping strategies training, and DME and/or AE instructions  RECOMMENDED OTHER SERVICES: N/A  CONSULTED AND AGREED WITH PLAN OF CARE: Patient  PLAN FOR NEXT SESSION: Manual Therapy, P/ROM, AA/ROM, Isometrics, wall slides, proximal shoulder strengthening, scapular strengthening, A/ROM   Trish Mage, OTR/L [phone] 01/21/2023, 9:46 AM

## 2023-01-28 ENCOUNTER — Encounter (HOSPITAL_COMMUNITY): Payer: Self-pay | Admitting: Occupational Therapy

## 2023-02-03 ENCOUNTER — Other Ambulatory Visit: Payer: Self-pay | Admitting: Internal Medicine

## 2023-02-03 DIAGNOSIS — Z1231 Encounter for screening mammogram for malignant neoplasm of breast: Secondary | ICD-10-CM

## 2023-02-04 ENCOUNTER — Encounter (HOSPITAL_COMMUNITY): Payer: Self-pay | Admitting: Occupational Therapy

## 2023-02-19 ENCOUNTER — Ambulatory Visit
Admission: RE | Admit: 2023-02-19 | Discharge: 2023-02-19 | Disposition: A | Discharge status: Home / Self Care | Payer: Medicare Other | Source: Ambulatory Visit | Attending: Internal Medicine | Admitting: Internal Medicine

## 2023-02-19 DIAGNOSIS — Z1231 Encounter for screening mammogram for malignant neoplasm of breast: Secondary | ICD-10-CM

## 2023-04-18 ENCOUNTER — Encounter: Payer: Self-pay | Admitting: Cardiology

## 2023-07-31 ENCOUNTER — Ambulatory Visit (HOSPITAL_COMMUNITY): Payer: Medicare Other | Attending: Orthopaedic Surgery | Admitting: Occupational Therapy

## 2023-07-31 ENCOUNTER — Encounter (HOSPITAL_COMMUNITY): Payer: Self-pay | Admitting: Occupational Therapy

## 2023-07-31 DIAGNOSIS — M25511 Pain in right shoulder: Secondary | ICD-10-CM | POA: Insufficient documentation

## 2023-07-31 DIAGNOSIS — R29898 Other symptoms and signs involving the musculoskeletal system: Secondary | ICD-10-CM | POA: Diagnosis present

## 2023-07-31 DIAGNOSIS — M25611 Stiffness of right shoulder, not elsewhere classified: Secondary | ICD-10-CM | POA: Diagnosis present

## 2023-07-31 NOTE — Therapy (Signed)
OUTPATIENT OCCUPATIONAL THERAPY ORTHO EVALUATION  Patient Name: Sarah Cooper MRN: [id number] DOB:08-17-1951, 72 y.o., female Today's Date: 08/03/2023   END OF SESSION:  OT End of Session - 08/03/23 1835     Visit Number 1    Number of Visits 9    Date for OT Re-Evaluation 09/05/23    Authorization Type [employer] Medicare (requesting 8 visits)    Authorization - Visit Number 0    Authorization - Number of Visits 8    OT Start Time 0803    OT Stop Time 0830    OT Time Calculation (min) 27 min    Activity Tolerance Patient tolerated treatment well    Behavior During Therapy WFL for tasks assessed/performed             Past Medical History:  Diagnosis Date   Arthritis    back, hips, knees   Cancer (HCC) 1978   cervical   CHF (congestive heart failure) (HCC)    [employer]reesia 11/28/2020   Hyperlipidemia 12/01/2020   Hypertension    Pneumothorax 1977   PONV (postoperative nausea and vomiting)    Past Surgical History:  Procedure Laterality Date   BREAST BIOPSY Left    approx @ 48 benign    collapsed lung on right      1970s   HERNIA REPAIR     age 5   HIP SURGERY  2007   right   JOINT REPLACEMENT  2007   TOTAL HIP ARTHROPLASTY Left 07/25/2020   Procedure: LEFT TOTAL HIP ARTHROPLASTY ANTERIOR APPROACH;  Surgeon: Marcene Corning, MD;  Location: [facility];  Service: Orthopedics;  Laterality: Left;   TOTAL KNEE ARTHROPLASTY Left 12/18/2021   Procedure: LEFT TOTAL KNEE ARTHROPLASTY;  Surgeon: Marcene Corning, MD;  Location: [facility];  Service: Orthopedics;  Laterality: Left;   Patient Active Problem List   Diagnosis Date Noted   Stage 3a chronic kidney disease (HCC) 10/05/2021   Muscle spasm 02/06/2021   Insomnia, unspecified 12/01/2020   Hyperlipidemia 12/01/2020   Chronic diastolic heart failure (HCC) 12/01/2020   Leg edema 12/01/2020   Primary localized osteoarthritis of left hip 07/25/2020   Tubular adenoma of colon 12/11/2016   Dyspnea 12/11/2016   Vitamin D  deficiency 11/21/2016   Pre-diabetes 11/21/2016   Essential hypertension 11/19/2016   Depression, recurrent (HCC) 11/19/2016   Osteoarthritis 11/19/2016   Chronic back pain greater than 3 months duration 11/19/2016   History of cervical dysplasia 11/19/2016   Morbid obesity (HCC) 11/19/2016    PCP: Gaspar Garbe, MD REFERRING PROVIDER: Marcene Corning, MD  ONSET DATE: 07/10/23  REFERRING DIAG: R shoulder scope x2  THERAPY DIAG:  Acute pain of right shoulder  Shoulder stiffness, right  Other symptoms and signs involving the musculoskeletal system  Rationale for Evaluation and Treatment: Rehabilitation  SUBJECTIVE:   SUBJECTIVE STATEMENT: "I'm just tired" Pt accompanied by: self  PERTINENT HISTORY: Pt presents to OT s/p 2nd R rotator cuff scope due to R rotator cuff tear. No further PMH noted in patient chart.   PRECAUTIONS: Shoulder  WEIGHT BEARING RESTRICTIONS: No  PAIN:  Are you having pain? Yes: NPRS scale: 3-4/10 Pain location: anterior shoulder girdle Pain description: sore and discomfort Aggravating factors: sleeping Relieving factors: medication and positioning  FALLS: Has patient fallen in last 6 months? No  PLOF: Independent  PATIENT GOALS: "I want to be able to bend down and tie my shoes"  NEXT MD VISIT: Unknown  OBJECTIVE:   HAND DOMINANCE: Right  ADLs: Overall  ADLs: Pt able to complete BADL's with compensatory strategies, unable to lift her arm to wash her hair or reach over shoulder height.   FUNCTIONAL OUTCOME MEASURES: Upper Extremity Functional Scale (UEFS): 48.8%  UPPER EXTREMITY ROM:       Assessed in seated, er/IR adducted  Active ROM Right eval  Shoulder flexion 72  Shoulder abduction 78  Shoulder internal rotation 90  Shoulder external rotation 57  (Blank rows = not tested)    UPPER EXTREMITY MMT:     Assessed in seated, er/IR adducted  MMT Right eval  Shoulder flexion   Shoulder abduction   Shoulder internal  rotation   Shoulder external rotation   (Blank rows = not tested)  SENSATION: WFL  EDEMA: moderate swelling around middle of the upper arm  OBSERVATIONS: moderate to severe fascial restrictions in the biceps, trapezius, and scapular region.   TODAY'S TREATMENT:                                                                                                                              DATE: 07/31/23: Evaluation Only    PATIENT EDUCATION: Education details: Table Slides Person educated: Patient Education method: Explanation, Demonstration, and Handouts Education comprehension: verbalized understanding and returned demonstration  HOME EXERCISE PROGRAM: 10/17: Table Slides  GOALS: Goals reviewed with patient? Yes   SHORT TERM GOALS: Target date: 09/05/23  Pt will be provided with and educated on HEP to improve mobility in RUE required for use during ADL completion.   Goal status: INITIAL  Pt will decrease pain in RUE to 3/10 or less to improve ability to sleep for 2+ consecutive hours without waking due to pain.   Goal status: INITIAL  2.  Pt will decrease RUE fascial restrictions to min amounts or less to improve mobility required for functional reaching tasks.   Goal status: INITIAL  3.  Pt will increase RUE A/ROM by 50 degrees to improve ability to use RUE when reaching overhead or behind back during dressing and bathing tasks.   Goal status: INITIAL  4.  Pt will increase RUE strength to 4+/5 or greater to improve ability to use RUE when lifting or carrying items during meal preparation/housework/yardwork tasks.   Goal status: INITIAL  5.  Pt will return to highest level of function using RUE as dominant during functional task completion.   Goal status: INITIAL   ASSESSMENT:  CLINICAL IMPRESSION: Patient is a 72 y.o. female who was seen today for occupational therapy evaluation for 2nd R shoulder scope. Pt presents with increased pain and fascial  restrictions, decreased ROM, strength, and functional use of the RUE.   PERFORMANCE DEFICITS: in functional skills including in functional skills including ADLs, IADLs, coordination, tone, ROM, strength, pain, fascial restrictions, muscle spasms, and UE functional use.  IMPAIRMENTS: are limiting patient from ADLs, IADLs, rest and sleep, leisure, and social participation.   COMORBIDITIES: has no other co-morbidities that affects occupational performance. Patient will benefit  from skilled OT to address above impairments and improve overall function.  MODIFICATION OR ASSISTANCE TO COMPLETE EVALUATION: No modification of tasks or assist necessary to complete an evaluation.  OT OCCUPATIONAL PROFILE AND HISTORY: Problem focused assessment: Including review of records relating to presenting problem.  CLINICAL DECISION MAKING: LOW - limited treatment options, no task modification necessary  REHAB POTENTIAL: Good  EVALUATION COMPLEXITY: Low      PLAN:  OT FREQUENCY: 2x/week  OT DURATION: 4 weeks  PLANNED INTERVENTIONS: self care/ADL training, therapeutic exercise, therapeutic activity, neuromuscular re-education, manual therapy, passive range of motion, splinting, electrical stimulation, ultrasound, moist heat, cryotherapy, patient/family education, and DME and/or AE instructions  RECOMMENDED OTHER SERVICES: N/A  CONSULTED AND AGREED WITH PLAN OF CARE: Patient  PLAN FOR NEXT SESSION: Manual Therapy, P/ROM, AA/ROM, Wall Washes, Thumb tacs   Trish Mage, OTR/L [facility] [phone] Kennyth Arnold, OT 08/03/2023, 6:38 PM

## 2023-08-07 ENCOUNTER — Ambulatory Visit (HOSPITAL_COMMUNITY): Payer: Medicare Other | Admitting: Occupational Therapy

## 2023-08-13 ENCOUNTER — Ambulatory Visit (HOSPITAL_COMMUNITY): Payer: Medicare Other | Admitting: Occupational Therapy

## 2023-08-13 ENCOUNTER — Encounter (HOSPITAL_COMMUNITY): Payer: Self-pay | Admitting: Occupational Therapy

## 2023-08-13 DIAGNOSIS — R29898 Other symptoms and signs involving the musculoskeletal system: Secondary | ICD-10-CM

## 2023-08-13 DIAGNOSIS — M25511 Pain in right shoulder: Secondary | ICD-10-CM

## 2023-08-13 DIAGNOSIS — M25611 Stiffness of right shoulder, not elsewhere classified: Secondary | ICD-10-CM

## 2023-08-13 NOTE — Therapy (Signed)
OUTPATIENT OCCUPATIONAL THERAPY ORTHO TREATMENT NOTE  Patient Name: Sarah Cooper MRN: [id number] DOB:05/02/1951, 72 y.o., female Today's Date: 08/13/2023   END OF SESSION:  OT End of Session - 08/13/23 1058     Visit Number 2    Number of Visits 9    Date for OT Re-Evaluation 09/05/23    Authorization Type [employer] Medicare    Authorization Time Period 8 visits approved from 10/20-11/18    Authorization - Visit Number 1    Authorization - Number of Visits 8    OT Start Time 1020    OT Stop Time 1058    OT Time Calculation (min) 38 min    Activity Tolerance Patient tolerated treatment well    Behavior During Therapy WFL for tasks assessed/performed             Past Medical History:  Diagnosis Date   Arthritis    back, hips, knees   Cancer (HCC) 1978   cervical   CHF (congestive heart failure) (HCC)    [facility]reesia 11/28/2020   Hyperlipidemia 12/01/2020   Hypertension    Pneumothorax 1977   PONV (postoperative nausea and vomiting)    Past Surgical History:  Procedure Laterality Date   BREAST BIOPSY Left    approx @ 48 benign    collapsed lung on right      1970s   HERNIA REPAIR     age 31   HIP SURGERY  2007   right   JOINT REPLACEMENT  2007   TOTAL HIP ARTHROPLASTY Left 07/25/2020   Procedure: LEFT TOTAL HIP ARTHROPLASTY ANTERIOR APPROACH;  Surgeon: Marcene Corning, MD;  Location: [facility];  Service: Orthopedics;  Laterality: Left;   TOTAL KNEE ARTHROPLASTY Left 12/18/2021   Procedure: LEFT TOTAL KNEE ARTHROPLASTY;  Surgeon: Marcene Corning, MD;  Location: [facility];  Service: Orthopedics;  Laterality: Left;   Patient Active Problem List   Diagnosis Date Noted   Stage 3a chronic kidney disease (HCC) 10/05/2021   Muscle spasm 02/06/2021   Insomnia, unspecified 12/01/2020   Hyperlipidemia 12/01/2020   Chronic diastolic heart failure (HCC) 12/01/2020   Leg edema 12/01/2020   Primary localized osteoarthritis of left hip 07/25/2020   Tubular adenoma of colon  12/11/2016   Dyspnea 12/11/2016   Vitamin D deficiency 11/21/2016   Pre-diabetes 11/21/2016   Essential hypertension 11/19/2016   Depression, recurrent (HCC) 11/19/2016   Osteoarthritis 11/19/2016   Chronic back pain greater than 3 months duration 11/19/2016   History of cervical dysplasia 11/19/2016   Morbid obesity (HCC) 11/19/2016    PCP: Gaspar Garbe, MD REFERRING PROVIDER: Marcene Corning, MD  ONSET DATE: 07/10/23  REFERRING DIAG: R shoulder scope x2  THERAPY DIAG:  Acute pain of right shoulder  Shoulder stiffness, right  Other symptoms and signs involving the musculoskeletal system  Rationale for Evaluation and Treatment: Rehabilitation  SUBJECTIVE:   SUBJECTIVE STATEMENT: "I've been trying to do the wall slides and moving it some" Pt accompanied by: self  PERTINENT HISTORY: Pt presents to OT s/p 2nd R rotator cuff scope due to R rotator cuff tear. No further PMH noted in patient chart.   PRECAUTIONS: Shoulder  WEIGHT BEARING RESTRICTIONS: No  PAIN:  Are you having pain? Yes: NPRS scale: 7/10 Pain location: anterior shoulder girdle Pain description: sore and discomfort Aggravating factors: sleeping Relieving factors: medication and positioning  FALLS: Has patient fallen in last 6 months? No  PLOF: Independent  PATIENT GOALS: "I want to be able to bend down and  tie my shoes"  NEXT MD VISIT: Unknown  OBJECTIVE:   HAND DOMINANCE: Right  ADLs: Overall ADLs: Pt able to complete BADL's with compensatory strategies, unable to lift her arm to wash her hair or reach over shoulder height.   FUNCTIONAL OUTCOME MEASURES: Upper Extremity Functional Scale (UEFS): 48.8%  UPPER EXTREMITY ROM:       Assessed in seated, er/IR adducted  Active ROM Right eval  Shoulder flexion 72  Shoulder abduction 78  Shoulder internal rotation 90  Shoulder external rotation 57  (Blank rows = not tested)    UPPER EXTREMITY MMT:     Assessed in seated, er/IR  adducted  MMT Right eval  Shoulder flexion   Shoulder abduction   Shoulder internal rotation   Shoulder external rotation   (Blank rows = not tested)  SENSATION: WFL  EDEMA: moderate swelling around middle of the upper arm  OBSERVATIONS: moderate to severe fascial restrictions in the biceps, trapezius, and scapular region.   TODAY'S TREATMENT:                                                                                                                              DATE:   08/13/23 -Manual Therapy: myofascial release and trigger point applied to biceps, trapezius, scapular region, and axillary region in order to reduce pain and fascial restrictions, as well as improve ROM.  -P/ROM: supine, flexion, abduction, er/IR, x10 -AA/ROM: supine, flexion, abduction, protraction, horizontal abduction, er/IR, x10 -Proximal Shoulder Exercises: supine, paddles, cross cross, circles both directions, x10 -Wall Slides: flexion, abduction, x10   PATIENT EDUCATION: Education details: AA/ROM Person educated: Patient Education method: Programmer, multimedia, Facilities manager, and Handouts Education comprehension: verbalized understanding and returned demonstration  HOME EXERCISE PROGRAM: 10/17: Table Slides 10/30: AA/ROM  GOALS: Goals reviewed with patient? Yes   SHORT TERM GOALS: Target date: 09/05/23  Pt will be provided with and educated on HEP to improve mobility in RUE required for use during ADL completion.   Goal status: IN PROGRESS  Pt will decrease pain in RUE to 3/10 or less to improve ability to sleep for 2+ consecutive hours without waking due to pain.   Goal status: IN PROGRESS  2.  Pt will decrease RUE fascial restrictions to min amounts or less to improve mobility required for functional reaching tasks.   Goal status: IN PROGRESS  3.  Pt will increase RUE A/ROM by 50 degrees to improve ability to use RUE when reaching overhead or behind back during dressing and bathing tasks.    Goal status: IN PROGRESS  4.  Pt will increase RUE strength to 4+/5 or greater to improve ability to use RUE when lifting or carrying items during meal preparation/housework/yardwork tasks.   Goal status: IN PROGRESS  5.  Pt will return to highest level of function using RUE as dominant during functional task completion.   Goal status: IN PROGRESS   ASSESSMENT:  CLINICAL IMPRESSION: Pt continuing to report increased stiffness addressed  with manual therapy and P/ROM. She was able to proceed to AA/ROM this session, where she achieved 65% of full ROM, which she feels is much better than her previous surgery. Pt did report several pops and crunchy sounds during her exercises this session, otherwise was able to complete all tasks with short rest breaks as needed for fatigue, with pain not increasing throughout session. OT providing verbal and tactile cuing as needed for positioning and technique.   PERFORMANCE DEFICITS: in functional skills including in functional skills including ADLs, IADLs, coordination, tone, ROM, strength, pain, fascial restrictions, muscle spasms, and UE functional use.    PLAN:  OT FREQUENCY: 2x/week  OT DURATION: 4 weeks  PLANNED INTERVENTIONS: self care/ADL training, therapeutic exercise, therapeutic activity, neuromuscular re-education, manual therapy, passive range of motion, splinting, electrical stimulation, ultrasound, moist heat, cryotherapy, patient/family education, and DME and/or AE instructions  RECOMMENDED OTHER SERVICES: N/A  CONSULTED AND AGREED WITH PLAN OF CARE: Patient  PLAN FOR NEXT SESSION: Manual Therapy, P/ROM, AA/ROM, Wall Washes, Thumb tacs   Trish Mage, OTR/L [facility] [phone] Kennyth Arnold, OT 08/13/2023, 9:27 PM

## 2023-08-15 ENCOUNTER — Encounter (HOSPITAL_COMMUNITY): Payer: Medicare Other | Admitting: Occupational Therapy

## 2023-08-20 ENCOUNTER — Encounter (HOSPITAL_COMMUNITY): Payer: Self-pay | Admitting: Occupational Therapy

## 2023-08-20 ENCOUNTER — Ambulatory Visit (HOSPITAL_COMMUNITY): Payer: Medicare Other | Attending: Orthopaedic Surgery | Admitting: Occupational Therapy

## 2023-08-20 DIAGNOSIS — R29898 Other symptoms and signs involving the musculoskeletal system: Secondary | ICD-10-CM | POA: Diagnosis present

## 2023-08-20 DIAGNOSIS — M25511 Pain in right shoulder: Secondary | ICD-10-CM | POA: Diagnosis present

## 2023-08-20 DIAGNOSIS — M25611 Stiffness of right shoulder, not elsewhere classified: Secondary | ICD-10-CM | POA: Insufficient documentation

## 2023-08-20 NOTE — Therapy (Unsigned)
OUTPATIENT OCCUPATIONAL THERAPY ORTHO TREATMENT NOTE  Patient Name: Sarah Cooper MRN: [id number] DOB:1951/03/30, 72 y.o., female Today's Date: 08/21/2023   END OF SESSION:  OT End of Session - 08/20/23 1345     Visit Number 3    Number of Visits 9    Date for OT Re-Evaluation 09/05/23    Authorization Type [employer] Medicare    Authorization Time Period 8 visits approved from 10/20-11/18    Authorization - Visit Number 2    Authorization - Number of Visits 8    OT Start Time 1307    OT Stop Time 1345    OT Time Calculation (min) 38 min    Activity Tolerance Patient tolerated treatment well    Behavior During Therapy WFL for tasks assessed/performed              Past Medical History:  Diagnosis Date   Arthritis    back, hips, knees   Cancer (HCC) 1978   cervical   CHF (congestive heart failure) (HCC)    Phreesia 11/28/2020   Hyperlipidemia 12/01/2020   Hypertension    Pneumothorax 1977   PONV (postoperative nausea and vomiting)    Past Surgical History:  Procedure Laterality Date   BREAST BIOPSY Left    approx @ 48 benign    collapsed lung on right      1970s   HERNIA REPAIR     age 28   HIP SURGERY  2007   right   JOINT REPLACEMENT  2007   TOTAL HIP ARTHROPLASTY Left 07/25/2020   Procedure: LEFT TOTAL HIP ARTHROPLASTY ANTERIOR APPROACH;  Surgeon: Marcene Corning, MD;  Location: [facility];  Service: Orthopedics;  Laterality: Left;   TOTAL KNEE ARTHROPLASTY Left 12/18/2021   Procedure: LEFT TOTAL KNEE ARTHROPLASTY;  Surgeon: Marcene Corning, MD;  Location: [facility];  Service: Orthopedics;  Laterality: Left;   Patient Active Problem List   Diagnosis Date Noted   Stage 3a chronic kidney disease (HCC) 10/05/2021   Muscle spasm 02/06/2021   Insomnia, unspecified 12/01/2020   Hyperlipidemia 12/01/2020   Chronic diastolic heart failure (HCC) 12/01/2020   Leg edema 12/01/2020   Primary localized osteoarthritis of left hip 07/25/2020   Tubular adenoma of colon  12/11/2016   Dyspnea 12/11/2016   Vitamin D deficiency 11/21/2016   Pre-diabetes 11/21/2016   Essential hypertension 11/19/2016   Depression, recurrent (HCC) 11/19/2016   Osteoarthritis 11/19/2016   Chronic back pain greater than 3 months duration 11/19/2016   History of cervical dysplasia 11/19/2016   Morbid obesity (HCC) 11/19/2016    PCP: Gaspar Garbe, MD REFERRING PROVIDER: Marcene Corning, MD  ONSET DATE: 07/10/23  REFERRING DIAG: R shoulder scope x2  THERAPY DIAG:  Acute pain of right shoulder  Shoulder stiffness, right  Other symptoms and signs involving the musculoskeletal system  Rationale for Evaluation and Treatment: Rehabilitation  SUBJECTIVE:   SUBJECTIVE STATEMENT: "I've been trying to do the wall slides and moving it some" Pt accompanied by: self  PERTINENT HISTORY: Pt presents to OT s/p 2nd R rotator cuff scope due to R rotator cuff tear. No further PMH noted in patient chart.   PRECAUTIONS: Shoulder  WEIGHT BEARING RESTRICTIONS: No  PAIN:  Are you having pain? Yes: NPRS scale: 7/10 Pain location: anterior shoulder girdle Pain description: sore and discomfort Aggravating factors: sleeping Relieving factors: medication and positioning  FALLS: Has patient fallen in last 6 months? No  PLOF: Independent  PATIENT GOALS: "I want to be able to bend down  and tie my shoes"  NEXT MD VISIT: Unknown  OBJECTIVE:   HAND DOMINANCE: Right  ADLs: Overall ADLs: Pt able to complete BADL's with compensatory strategies, unable to lift her arm to wash her hair or reach over shoulder height.   FUNCTIONAL OUTCOME MEASURES: Upper Extremity Functional Scale (UEFS): 48.8%  UPPER EXTREMITY ROM:       Assessed in seated, er/IR adducted  Active ROM Right eval  Shoulder flexion 72  Shoulder abduction 78  Shoulder internal rotation 90  Shoulder external rotation 57  (Blank rows = not tested)    UPPER EXTREMITY MMT:     Assessed in seated, er/IR  adducted  MMT Right eval  Shoulder flexion   Shoulder abduction   Shoulder internal rotation   Shoulder external rotation   (Blank rows = not tested)  SENSATION: WFL  EDEMA: moderate swelling around middle of the upper arm  OBSERVATIONS: moderate to severe fascial restrictions in the biceps, trapezius, and scapular region.   TODAY'S TREATMENT:                                                                                                                              DATE:   08/20/23 -Manual Therapy: myofascial release and trigger point applied to biceps, trapezius, scapular region, and axillary region in order to reduce pain and fascial restrictions, as well as improve ROM.  -P/ROM: supine, flexion, abduction, er/IR, x8 -AA/ROM: supine, flexion, abduction, protraction, horizontal abduction, er/IR, x12 -A/ROM: supine, flexion, abduction, protraction, horizontal abduction, er/IR, x12 -Proximal Shoulder Exercises: seated, paddles, criss cross, circles both directions, x10 each -ABC's in the air  08/13/23 -Manual Therapy: myofascial release and trigger point applied to biceps, trapezius, scapular region, and axillary region in order to reduce pain and fascial restrictions, as well as improve ROM.  -P/ROM: supine, flexion, abduction, er/IR, x10 -AA/ROM: supine, flexion, abduction, protraction, horizontal abduction, er/IR, x10 -Proximal Shoulder Exercises: supine, paddles, cross cross, circles both directions, x10 -Wall Slides: flexion, abduction, x10   PATIENT EDUCATION: Education details: A/ROM Person educated: Patient Education method: Programmer, multimedia, Facilities manager, and Handouts Education comprehension: verbalized understanding and returned demonstration  HOME EXERCISE PROGRAM: 10/17: Table Slides 10/30: AA/ROM 11/6: A/ROM  GOALS: Goals reviewed with patient? Yes   SHORT TERM GOALS: Target date: 09/05/23  Pt will be provided with and educated on HEP to improve mobility  in RUE required for use during ADL completion.   Goal status: IN PROGRESS  Pt will decrease pain in RUE to 3/10 or less to improve ability to sleep for 2+ consecutive hours without waking due to pain.   Goal status: IN PROGRESS  2.  Pt will decrease RUE fascial restrictions to min amounts or less to improve mobility required for functional reaching tasks.   Goal status: IN PROGRESS  3.  Pt will increase RUE A/ROM by 50 degrees to improve ability to use RUE when reaching overhead or behind back during dressing and bathing tasks.  Goal status: IN PROGRESS  4.  Pt will increase RUE strength to 4+/5 or greater to improve ability to use RUE when lifting or carrying items during meal preparation/housework/yardwork tasks.   Goal status: IN PROGRESS  5.  Pt will return to highest level of function using RUE as dominant during functional task completion.   Goal status: IN PROGRESS   ASSESSMENT:  CLINICAL IMPRESSION: This session pt continuing to report increased pain and discomfort. She continues to have moderate fascial restrictions addressed with manual therapy. Additionally, this session she was able to progress to A/ROM with 75% of full ROM and good movement pattern. OT then had pt start proximal shoulder exercises for stability. Verbal and tactile cuing provided for positioning and technique throughout session.   PERFORMANCE DEFICITS: in functional skills including in functional skills including ADLs, IADLs, coordination, tone, ROM, strength, pain, fascial restrictions, muscle spasms, and UE functional use.    PLAN:  OT FREQUENCY: 2x/week  OT DURATION: 4 weeks  PLANNED INTERVENTIONS: self care/ADL training, therapeutic exercise, therapeutic activity, neuromuscular re-education, manual therapy, passive range of motion, splinting, electrical stimulation, ultrasound, moist heat, cryotherapy, patient/family education, and DME and/or AE instructions  RECOMMENDED OTHER SERVICES:  N/A  CONSULTED AND AGREED WITH PLAN OF CARE: Patient  PLAN FOR NEXT SESSION: Manual Therapy, P/ROM, AA/ROM, Wall Washes, Thumb tacs   Trish Mage, OTR/L [facility] [phone] Thierno Hun Rosemarie Beath, OT 08/21/2023, 9:12 PM

## 2023-08-20 NOTE — Patient Instructions (Signed)

## 2023-08-22 ENCOUNTER — Ambulatory Visit (HOSPITAL_COMMUNITY): Payer: Medicare Other | Admitting: Occupational Therapy

## 2023-08-22 ENCOUNTER — Encounter (HOSPITAL_COMMUNITY): Payer: Self-pay | Admitting: Occupational Therapy

## 2023-08-22 DIAGNOSIS — M25511 Pain in right shoulder: Secondary | ICD-10-CM | POA: Diagnosis not present

## 2023-08-22 DIAGNOSIS — M25611 Stiffness of right shoulder, not elsewhere classified: Secondary | ICD-10-CM

## 2023-08-22 DIAGNOSIS — R29898 Other symptoms and signs involving the musculoskeletal system: Secondary | ICD-10-CM

## 2023-08-22 NOTE — Therapy (Unsigned)
OUTPATIENT OCCUPATIONAL THERAPY ORTHO TREATMENT NOTE  Patient Name: Sarah Cooper MRN: [id number] DOB:1950/10/26, 72 y.o., female Today's Date: 08/22/2023   END OF SESSION:     Past Medical History:  Diagnosis Date   Arthritis    back, hips, knees   Cancer (HCC) 1978   cervical   CHF (congestive heart failure) (HCC)    Phreesia 11/28/2020   Hyperlipidemia 12/01/2020   Hypertension    Pneumothorax 1977   PONV (postoperative nausea and vomiting)    Past Surgical History:  Procedure Laterality Date   BREAST BIOPSY Left    approx @ 48 benign    collapsed lung on right      1970s   HERNIA REPAIR     age 68   HIP SURGERY  2007   right   JOINT REPLACEMENT  2007   TOTAL HIP ARTHROPLASTY Left 07/25/2020   Procedure: LEFT TOTAL HIP ARTHROPLASTY ANTERIOR APPROACH;  Surgeon: Marcene Corning, MD;  Location: [facility];  Service: Orthopedics;  Laterality: Left;   TOTAL KNEE ARTHROPLASTY Left 12/18/2021   Procedure: LEFT TOTAL KNEE ARTHROPLASTY;  Surgeon: Marcene Corning, MD;  Location: [facility];  Service: Orthopedics;  Laterality: Left;   Patient Active Problem List   Diagnosis Date Noted   Stage 3a chronic kidney disease (HCC) 10/05/2021   Muscle spasm 02/06/2021   Insomnia, unspecified 12/01/2020   Hyperlipidemia 12/01/2020   Chronic diastolic heart failure (HCC) 12/01/2020   Leg edema 12/01/2020   Primary localized osteoarthritis of left hip 07/25/2020   Tubular adenoma of colon 12/11/2016   Dyspnea 12/11/2016   Vitamin D deficiency 11/21/2016   Pre-diabetes 11/21/2016   Essential hypertension 11/19/2016   Depression, recurrent (HCC) 11/19/2016   Osteoarthritis 11/19/2016   Chronic back pain greater than 3 months duration 11/19/2016   History of cervical dysplasia 11/19/2016   Morbid obesity (HCC) 11/19/2016    PCP: Gaspar Garbe, MD REFERRING PROVIDER: Marcene Corning, MD  ONSET DATE: 07/10/23  REFERRING DIAG: R shoulder scope x2  THERAPY DIAG:  No  diagnosis found.  Rationale for Evaluation and Treatment: Rehabilitation  SUBJECTIVE:   SUBJECTIVE STATEMENT: "I've been trying to do the wall slides and moving it some" Pt accompanied by: self  PERTINENT HISTORY: Pt presents to OT s/p 2nd R rotator cuff scope due to R rotator cuff tear. No further PMH noted in patient chart.   PRECAUTIONS: Shoulder  WEIGHT BEARING RESTRICTIONS: No  PAIN:  Are you having pain? Yes: NPRS scale: 7/10 Pain location: anterior shoulder girdle Pain description: sore and discomfort Aggravating factors: sleeping Relieving factors: medication and positioning  FALLS: Has patient fallen in last 6 months? No  PLOF: Independent  PATIENT GOALS: "I want to be able to bend down and tie my shoes"  NEXT MD VISIT: Unknown  OBJECTIVE:   HAND DOMINANCE: Right  ADLs: Overall ADLs: Pt able to complete BADL's with compensatory strategies, unable to lift her arm to wash her hair or reach over shoulder height.   FUNCTIONAL OUTCOME MEASURES: Upper Extremity Functional Scale (UEFS): 48.8%  UPPER EXTREMITY ROM:       Assessed in seated, er/IR adducted  Active ROM Right eval  Shoulder flexion 72  Shoulder abduction 78  Shoulder internal rotation 90  Shoulder external rotation 57  (Blank rows = not tested)    UPPER EXTREMITY MMT:     Assessed in seated, er/IR adducted  MMT Right eval  Shoulder flexion   Shoulder abduction   Shoulder internal rotation   Shoulder  external rotation   (Blank rows = not tested)  SENSATION: WFL  EDEMA: moderate swelling around middle of the upper arm  OBSERVATIONS: moderate to severe fascial restrictions in the biceps, trapezius, and scapular region.   TODAY'S TREATMENT:                                                                                                                              DATE:   08/22/23 -Manual Therapy: myofascial release and trigger point applied to biceps, trapezius, scapular region,  and axillary region in order to reduce pain and fascial restrictions, as well as improve ROM.  -P/ROM: supine, flexion, abduction, er/IR, x8 -AA/ROM: supine, flexion, abduction, protraction, horizontal abduction, er/IR, x12 -wall slides: flexion, abduction, x10 -UBE: level 1,   08/20/23 -Manual Therapy: myofascial release and trigger point applied to biceps, trapezius, scapular region, and axillary region in order to reduce pain and fascial restrictions, as well as improve ROM.  -P/ROM: supine, flexion, abduction, er/IR, x8 -AA/ROM: supine, flexion, abduction, protraction, horizontal abduction, er/IR, x12 -A/ROM: supine, flexion, abduction, protraction, horizontal abduction, er/IR, x12 -Proximal Shoulder Exercises: seated, paddles, criss cross, circles both directions, x10 each -ABC's in the air  08/13/23 -Manual Therapy: myofascial release and trigger point applied to biceps, trapezius, scapular region, and axillary region in order to reduce pain and fascial restrictions, as well as improve ROM.  -P/ROM: supine, flexion, abduction, er/IR, x10 -AA/ROM: supine, flexion, abduction, protraction, horizontal abduction, er/IR, x10 -Proximal Shoulder Exercises: supine, paddles, cross cross, circles both directions, x10 -Wall Slides: flexion, abduction, x10   PATIENT EDUCATION: Education details: A/ROM Person educated: Patient Education method: Programmer, multimedia, Facilities manager, and Handouts Education comprehension: verbalized understanding and returned demonstration  HOME EXERCISE PROGRAM: 10/17: Table Slides 10/30: AA/ROM 11/6: A/ROM  GOALS: Goals reviewed with patient? Yes   SHORT TERM GOALS: Target date: 09/05/23  Pt will be provided with and educated on HEP to improve mobility in RUE required for use during ADL completion.   Goal status: IN PROGRESS  Pt will decrease pain in RUE to 3/10 or less to improve ability to sleep for 2+ consecutive hours without waking due to pain.   Goal  status: IN PROGRESS  2.  Pt will decrease RUE fascial restrictions to min amounts or less to improve mobility required for functional reaching tasks.   Goal status: IN PROGRESS  3.  Pt will increase RUE A/ROM by 50 degrees to improve ability to use RUE when reaching overhead or behind back during dressing and bathing tasks.   Goal status: IN PROGRESS  4.  Pt will increase RUE strength to 4+/5 or greater to improve ability to use RUE when lifting or carrying items during meal preparation/housework/yardwork tasks.   Goal status: IN PROGRESS  5.  Pt will return to highest level of function using RUE as dominant during functional task completion.   Goal status: IN PROGRESS   ASSESSMENT:  CLINICAL IMPRESSION: This session pt continuing to report increased  pain and discomfort. She continues to have moderate fascial restrictions addressed with manual therapy. Additionally, this session she was able to progress to A/ROM with 75% of full ROM and good movement pattern. OT then had pt start proximal shoulder exercises for stability. Verbal and tactile cuing provided for positioning and technique throughout session.   PERFORMANCE DEFICITS: in functional skills including in functional skills including ADLs, IADLs, coordination, tone, ROM, strength, pain, fascial restrictions, muscle spasms, and UE functional use.    PLAN:  OT FREQUENCY: 2x/week  OT DURATION: 4 weeks  PLANNED INTERVENTIONS: self care/ADL training, therapeutic exercise, therapeutic activity, neuromuscular re-education, manual therapy, passive range of motion, splinting, electrical stimulation, ultrasound, moist heat, cryotherapy, patient/family education, and DME and/or AE instructions  RECOMMENDED OTHER SERVICES: N/A  CONSULTED AND AGREED WITH PLAN OF CARE: Patient  PLAN FOR NEXT SESSION: Manual Therapy, P/ROM, AA/ROM, Wall Washes, Thumb tacs   Trish Mage, OTR/L [facility] [phone] Kennyth Arnold, OT 08/22/2023, 11:08 AM

## 2023-08-27 ENCOUNTER — Ambulatory Visit (HOSPITAL_COMMUNITY): Payer: Medicare Other | Admitting: Occupational Therapy

## 2023-08-27 ENCOUNTER — Encounter (HOSPITAL_COMMUNITY): Payer: Self-pay | Admitting: Occupational Therapy

## 2023-08-27 DIAGNOSIS — M25511 Pain in right shoulder: Secondary | ICD-10-CM | POA: Diagnosis not present

## 2023-08-27 DIAGNOSIS — R29898 Other symptoms and signs involving the musculoskeletal system: Secondary | ICD-10-CM

## 2023-08-27 DIAGNOSIS — M25611 Stiffness of right shoulder, not elsewhere classified: Secondary | ICD-10-CM

## 2023-08-27 NOTE — Patient Instructions (Signed)

## 2023-08-27 NOTE — Therapy (Unsigned)
OUTPATIENT OCCUPATIONAL THERAPY ORTHO TREATMENT NOTE  Patient Name: Sarah Cooper MRN: [id number] DOB:10/05/1951, 72 y.o., female Today's Date: 08/28/2023   END OF SESSION:  OT End of Session - 08/27/23 1229     Visit Number 5    Number of Visits 9    Date for OT Re-Evaluation 09/05/23    Authorization Type [employer] Medicare    Authorization Time Period 8 visits approved from 10/20-11/18    Authorization - Visit Number 4    Authorization - Number of Visits 8    OT Start Time 1151    OT Stop Time 1229    OT Time Calculation (min) 38 min    Activity Tolerance Patient tolerated treatment well    Behavior During Therapy WFL for tasks assessed/performed            Past Medical History:  Diagnosis Date   Arthritis    back, hips, knees   Cancer (HCC) 1978   cervical   CHF (congestive heart failure) (HCC)    [employer]reesia 11/28/2020   Hyperlipidemia 12/01/2020   Hypertension    Pneumothorax 1977   PONV (postoperative nausea and vomiting)    Past Surgical History:  Procedure Laterality Date   BREAST BIOPSY Left    approx @ 48 benign    collapsed lung on right      1970s   HERNIA REPAIR     age 15   HIP SURGERY  2007   right   JOINT REPLACEMENT  2007   TOTAL HIP ARTHROPLASTY Left 07/25/2020   Procedure: LEFT TOTAL HIP ARTHROPLASTY ANTERIOR APPROACH;  Surgeon: Marcene Corning, MD;  Location: [facility];  Service: Orthopedics;  Laterality: Left;   TOTAL KNEE ARTHROPLASTY Left 12/18/2021   Procedure: LEFT TOTAL KNEE ARTHROPLASTY;  Surgeon: Marcene Corning, MD;  Location: [facility];  Service: Orthopedics;  Laterality: Left;   Patient Active Problem List   Diagnosis Date Noted   Stage 3a chronic kidney disease (HCC) 10/05/2021   Muscle spasm 02/06/2021   Insomnia, unspecified 12/01/2020   Hyperlipidemia 12/01/2020   Chronic diastolic heart failure (HCC) 12/01/2020   Leg edema 12/01/2020   Primary localized osteoarthritis of left hip 07/25/2020   Tubular adenoma of colon  12/11/2016   Dyspnea 12/11/2016   Vitamin D deficiency 11/21/2016   Pre-diabetes 11/21/2016   Essential hypertension 11/19/2016   Depression, recurrent (HCC) 11/19/2016   Osteoarthritis 11/19/2016   Chronic back pain greater than 3 months duration 11/19/2016   History of cervical dysplasia 11/19/2016   Morbid obesity (HCC) 11/19/2016    PCP: Gaspar Garbe, MD REFERRING PROVIDER: Marcene Corning, MD  ONSET DATE: 07/10/23  REFERRING DIAG: R shoulder scope x2  THERAPY DIAG:  Acute pain of right shoulder  Shoulder stiffness, right  Other symptoms and signs involving the musculoskeletal system  Rationale for Evaluation and Treatment: Rehabilitation  SUBJECTIVE:   SUBJECTIVE STATEMENT: "I'm sore" Pt accompanied by: self  PERTINENT HISTORY: Pt presents to OT s/p 2nd R rotator cuff scope due to R rotator cuff tear. No further PMH noted in patient chart.   PRECAUTIONS: Shoulder  WEIGHT BEARING RESTRICTIONS: No  PAIN:  Are you having pain? Yes: NPRS scale: 2/10 Pain location: anterior shoulder girdle Pain description: sore and discomfort Aggravating factors: sleeping Relieving factors: medication and positioning  FALLS: Has patient fallen in last 6 months? No  PLOF: Independent  PATIENT GOALS: "I want to be able to bend down and tie my shoes"  NEXT MD VISIT: Unknown  OBJECTIVE:  HAND DOMINANCE: Right  ADLs: Overall ADLs: Pt able to complete BADL's with compensatory strategies, unable to lift her arm to wash her hair or reach over shoulder height.   FUNCTIONAL OUTCOME MEASURES: Upper Extremity Functional Scale (UEFS): 48.8%  UPPER EXTREMITY ROM:       Assessed in seated, er/IR adducted  Active ROM Right eval  Shoulder flexion 72  Shoulder abduction 78  Shoulder internal rotation 90  Shoulder external rotation 57  (Blank rows = not tested)    UPPER EXTREMITY MMT:     Assessed in seated, er/IR adducted  MMT Right eval  Shoulder flexion    Shoulder abduction   Shoulder internal rotation   Shoulder external rotation   (Blank rows = not tested)  SENSATION: WFL  EDEMA: moderate swelling around middle of the upper arm  OBSERVATIONS: moderate to severe fascial restrictions in the biceps, trapezius, and scapular region.   TODAY'S TREATMENT:                                                                                                                              DATE:   08/27/23 -UBE: level 2, 2.5' forwards and backwards -Pulleys: flexion, abduction, x60" each -AA/ROM: seated, flexion, abduction, protraction, horizontal abduction, er/IR, x10 -A/ROM: seated, flexion, abduction, protraction, horizontal abduction, er/IR, x10 -Therapy Ball Exercises: green ball, flexion, protraction, overhead press, V ups, circles both directions, x10 -Ball On the Wall Stretch: Flexion, x10 -Wall Slide: abduction, x10  08/22/23 -Manual Therapy: myofascial release and trigger point applied to biceps, trapezius, scapular region, and axillary region in order to reduce pain and fascial restrictions, as well as improve ROM.  -P/ROM: supine, flexion, abduction, er/IR, x8 -AA/ROM: supine, flexion, abduction, protraction, horizontal abduction, er/IR, x12 -A/ROM: supine, flexion, abduction, protraction, horizontal abduction, er/IR, x12 -wall slides: flexion, abduction, x10 -UBE: level 1, 2.5' forwards and backwards  08/20/23 -Manual Therapy: myofascial release and trigger point applied to biceps, trapezius, scapular region, and axillary region in order to reduce pain and fascial restrictions, as well as improve ROM.  -P/ROM: supine, flexion, abduction, er/IR, x8 -AA/ROM: supine, flexion, abduction, protraction, horizontal abduction, er/IR, x12 -A/ROM: supine, flexion, abduction, protraction, horizontal abduction, er/IR, x12 -Proximal Shoulder Exercises: seated, paddles, criss cross, circles both directions, x10 each -ABC's in the air   PATIENT  EDUCATION: Education details: Theraball Exercises Person educated: Patient Education method: Explanation, Demonstration, and Handouts Education comprehension: verbalized understanding and returned demonstration  HOME EXERCISE PROGRAM: 10/17: Table Slides 10/30: AA/ROM 11/6: A/ROM 11/13: Theraball Exercises  GOALS: Goals reviewed with patient? Yes   SHORT TERM GOALS: Target date: 09/05/23  Pt will be provided with and educated on HEP to improve mobility in RUE required for use during ADL completion.   Goal status: IN PROGRESS  Pt will decrease pain in RUE to 3/10 or less to improve ability to sleep for 2+ consecutive hours without waking due to pain.   Goal status: IN PROGRESS  2.  Pt  will decrease RUE fascial restrictions to min amounts or less to improve mobility required for functional reaching tasks.   Goal status: IN PROGRESS  3.  Pt will increase RUE A/ROM by 50 degrees to improve ability to use RUE when reaching overhead or behind back during dressing and bathing tasks.   Goal status: IN PROGRESS  4.  Pt will increase RUE strength to 4+/5 or greater to improve ability to use RUE when lifting or carrying items during meal preparation/housework/yardwork tasks.   Goal status: IN PROGRESS  5.  Pt will return to highest level of function using RUE as dominant during functional task completion.   Goal status: IN PROGRESS   ASSESSMENT:  CLINICAL IMPRESSION: Pt reporting that her MD appointment went well and the doctor stated that when she felt like her ROM and strength had improved, that she wouldn't need therapy any more. This session pt demonstrating improving ROM and low level strength. She is achieving almost full ROM. Verbal and tactile cuing provided for positioning and technique throughout session.   PERFORMANCE DEFICITS: in functional skills including in functional skills including ADLs, IADLs, coordination, tone, ROM, strength, pain, fascial restrictions,  muscle spasms, and UE functional use.    PLAN:  OT FREQUENCY: 2x/week  OT DURATION: 4 weeks  PLANNED INTERVENTIONS: self care/ADL training, therapeutic exercise, therapeutic activity, neuromuscular re-education, manual therapy, passive range of motion, splinting, electrical stimulation, ultrasound, moist heat, cryotherapy, patient/family education, and DME and/or AE instructions  RECOMMENDED OTHER SERVICES: N/A  CONSULTED AND AGREED WITH PLAN OF CARE: Patient  PLAN FOR NEXT SESSION: Manual Therapy, P/ROM, AA/ROM, Wall Washes, Thumb tacs   Trish Mage, OTR/L [facility] [phone] Kennyth Arnold, OT 08/28/2023, 7:45 PM

## 2023-08-29 ENCOUNTER — Ambulatory Visit (HOSPITAL_COMMUNITY): Payer: Medicare Other | Admitting: Occupational Therapy

## 2023-08-29 ENCOUNTER — Encounter (HOSPITAL_COMMUNITY): Payer: Self-pay | Admitting: Occupational Therapy

## 2023-08-29 DIAGNOSIS — M25511 Pain in right shoulder: Secondary | ICD-10-CM | POA: Diagnosis not present

## 2023-08-29 DIAGNOSIS — R29898 Other symptoms and signs involving the musculoskeletal system: Secondary | ICD-10-CM

## 2023-08-29 DIAGNOSIS — M25611 Stiffness of right shoulder, not elsewhere classified: Secondary | ICD-10-CM

## 2023-08-29 NOTE — Therapy (Signed)
OUTPATIENT OCCUPATIONAL THERAPY ORTHO TREATMENT NOTE  Patient Name: Sarah Cooper MRN: [id number] DOB:[birth year], 72 y.o., female Today's Date: 08/29/2023   END OF SESSION:  OT End of Session - 08/29/23 1853     Visit Number 6    Number of Visits 9    Date for OT Re-Evaluation 09/05/23    Authorization Type [employer] Medicare    Authorization Time Period 8 visits approved from 10/20-11/18    Authorization - Visit Number 5    Authorization - Number of Visits 8    OT Start Time 1106    OT Stop Time 1144    OT Time Calculation (min) 38 min    Activity Tolerance Patient tolerated treatment well    Behavior During Therapy WFL for tasks assessed/performed             Past Medical History:  Diagnosis Date   Arthritis    back, hips, knees   Cancer (HCC) 1978   cervical   CHF (congestive heart failure) (HCC)    Phreesia 11/28/2020   Hyperlipidemia 12/01/2020   Hypertension    Pneumothorax 1977   PONV (postoperative nausea and vomiting)    Past Surgical History:  Procedure Laterality Date   BREAST BIOPSY Left    approx @ 48 benign    collapsed lung on right      1970s   HERNIA REPAIR     age 72   HIP SURGERY  2007   right   JOINT REPLACEMENT  2007   TOTAL HIP ARTHROPLASTY Left 07/25/2020   Procedure: LEFT TOTAL HIP ARTHROPLASTY ANTERIOR APPROACH;  Surgeon: Marcene Corning, MD;  Location: [facility];  Service: Orthopedics;  Laterality: Left;   TOTAL KNEE ARTHROPLASTY Left 12/18/2021   Procedure: LEFT TOTAL KNEE ARTHROPLASTY;  Surgeon: Marcene Corning, MD;  Location: [facility];  Service: Orthopedics;  Laterality: Left;   Patient Active Problem List   Diagnosis Date Noted   Stage 3a chronic kidney disease (HCC) 10/05/2021   Muscle spasm 02/06/2021   Insomnia, unspecified 12/01/2020   Hyperlipidemia 12/01/2020   Chronic diastolic heart failure (HCC) 12/01/2020   Leg edema 12/01/2020   Primary localized osteoarthritis of left hip 07/25/2020   Tubular adenoma of colon  12/11/2016   Dyspnea 12/11/2016   Vitamin D deficiency 11/21/2016   Pre-diabetes 11/21/2016   Essential hypertension 11/19/2016   Depression, recurrent (HCC) 11/19/2016   Osteoarthritis 11/19/2016   Chronic back pain greater than 3 months duration 11/19/2016   History of cervical dysplasia 11/19/2016   Morbid obesity (HCC) 11/19/2016    PCP: Gaspar Garbe, MD REFERRING PROVIDER: Marcene Corning, MD  ONSET DATE: 07/10/23  REFERRING DIAG: R shoulder scope x2  THERAPY DIAG:  Acute pain of right shoulder  Shoulder stiffness, right  Other symptoms and signs involving the musculoskeletal system  Rationale for Evaluation and Treatment: Rehabilitation  SUBJECTIVE:   SUBJECTIVE STATEMENT: "I really don't feel like doing much today" Pt accompanied by: self  PERTINENT HISTORY: Pt presents to OT s/p 2nd R rotator cuff scope due to R rotator cuff tear. No further PMH noted in patient chart.   PRECAUTIONS: Shoulder  WEIGHT BEARING RESTRICTIONS: No  PAIN:  Are you having pain? Yes: NPRS scale: 5/10 Pain location: anterior shoulder girdle Pain description: sore and discomfort Aggravating factors: sleeping Relieving factors: medication and positioning  FALLS: Has patient fallen in last 6 months? No  PLOF: Independent  PATIENT GOALS: "I want to be able to bend down and tie my shoes"  NEXT MD VISIT: Unknown  OBJECTIVE:   HAND DOMINANCE: Right  ADLs: Overall ADLs: Pt able to complete BADL's with compensatory strategies, unable to lift her arm to wash her hair or reach over shoulder height.   FUNCTIONAL OUTCOME MEASURES: Upper Extremity Functional Scale (UEFS): 48.8%  UPPER EXTREMITY ROM:       Assessed in seated, er/IR adducted  Active ROM Right eval  Shoulder flexion 72  Shoulder abduction 78  Shoulder internal rotation 90  Shoulder external rotation 57  (Blank rows = not tested)    UPPER EXTREMITY MMT:     Assessed in seated, er/IR adducted  MMT  Right eval  Shoulder flexion   Shoulder abduction   Shoulder internal rotation   Shoulder external rotation   (Blank rows = not tested)  SENSATION: WFL  EDEMA: moderate swelling around middle of the upper arm  OBSERVATIONS: moderate to severe fascial restrictions in the biceps, trapezius, and scapular region.   TODAY'S TREATMENT:                                                                                                                              DATE:   08/29/23 -Manual Therapy: myofascial release and trigger point applied to biceps, trapezius, scapular region, and axillary region in order to reduce pain and fascial restrictions, as well as improve ROM.  -AA/ROM: seated, flexion, abduction, protraction, horizontal abduction, er/IR, x10 -Wall Slides: flexion, abduction, x10 -Pulleys: flexion, abduction, x60"  08/27/23 -UBE: level 2, 2.5' forwards and backwards -Pulleys: flexion, abduction, x60" each -AA/ROM: seated, flexion, abduction, protraction, horizontal abduction, er/IR, x10 -A/ROM: seated, flexion, abduction, protraction, horizontal abduction, er/IR, x10 -Therapy Ball Exercises: green ball, flexion, protraction, overhead press, V ups, circles both directions, x10 -Ball On the Wall Stretch: Flexion, x10 -Wall Slide: abduction, x10  08/22/23 -Manual Therapy: myofascial release and trigger point applied to biceps, trapezius, scapular region, and axillary region in order to reduce pain and fascial restrictions, as well as improve ROM.  -P/ROM: supine, flexion, abduction, er/IR, x8 -AA/ROM: supine, flexion, abduction, protraction, horizontal abduction, er/IR, x12 -A/ROM: supine, flexion, abduction, protraction, horizontal abduction, er/IR, x12 -wall slides: flexion, abduction, x10 -UBE: level 1, 2.5' forwards and backwards   PATIENT EDUCATION: Education details: Theraball Exercises Person educated: Patient Education method: Explanation, Demonstration, and  Handouts Education comprehension: verbalized understanding and returned demonstration  HOME EXERCISE PROGRAM: 10/17: Table Slides 10/30: AA/ROM 11/6: A/ROM 11/13: Theraball Exercises  GOALS: Goals reviewed with patient? Yes   SHORT TERM GOALS: Target date: 09/05/23  Pt will be provided with and educated on HEP to improve mobility in RUE required for use during ADL completion.   Goal status: IN PROGRESS  Pt will decrease pain in RUE to 3/10 or less to improve ability to sleep for 2+ consecutive hours without waking due to pain.   Goal status: IN PROGRESS  2.  Pt will decrease RUE fascial restrictions to min amounts or less to improve mobility required  for functional reaching tasks.   Goal status: IN PROGRESS  3.  Pt will increase RUE A/ROM by 50 degrees to improve ability to use RUE when reaching overhead or behind back during dressing and bathing tasks.   Goal status: IN PROGRESS  4.  Pt will increase RUE strength to 4+/5 or greater to improve ability to use RUE when lifting or carrying items during meal preparation/housework/yardwork tasks.   Goal status: IN PROGRESS  5.  Pt will return to highest level of function using RUE as dominant during functional task completion.   Goal status: IN PROGRESS   ASSESSMENT:  CLINICAL IMPRESSION: This session, pt having increased pain and discomfort, as well as stiffness. She did not want to do any exercises this session, however with manual therapy and increased time to complete AA/ROM to stretch out. Towards the end of the session she was able to achieve 60-65% of full ROM. Verbal and tactile cuing provided for positioning and technique.   PERFORMANCE DEFICITS: in functional skills including in functional skills including ADLs, IADLs, coordination, tone, ROM, strength, pain, fascial restrictions, muscle spasms, and UE functional use.    PLAN:  OT FREQUENCY: 2x/week  OT DURATION: 4 weeks  PLANNED INTERVENTIONS: self  care/ADL training, therapeutic exercise, therapeutic activity, neuromuscular re-education, manual therapy, passive range of motion, splinting, electrical stimulation, ultrasound, moist heat, cryotherapy, patient/family education, and DME and/or AE instructions  RECOMMENDED OTHER SERVICES: N/A  CONSULTED AND AGREED WITH PLAN OF CARE: Patient  PLAN FOR NEXT SESSION: Manual Therapy, P/ROM, AA/ROM, Wall Washes, Thumb tacs   Trish Mage, OTR/L [facility] [phone] Dakwon Wenberg Rosemarie Beath, OT 08/29/2023, 6:54 PM

## 2023-09-03 ENCOUNTER — Ambulatory Visit (HOSPITAL_COMMUNITY): Payer: Medicare Other | Admitting: Occupational Therapy

## 2023-09-03 ENCOUNTER — Encounter (HOSPITAL_COMMUNITY): Payer: Self-pay | Admitting: Occupational Therapy

## 2023-09-03 ENCOUNTER — Encounter (HOSPITAL_COMMUNITY): Payer: Medicare Other | Admitting: Occupational Therapy

## 2023-09-03 DIAGNOSIS — M25511 Pain in right shoulder: Secondary | ICD-10-CM

## 2023-09-03 DIAGNOSIS — M25611 Stiffness of right shoulder, not elsewhere classified: Secondary | ICD-10-CM

## 2023-09-03 DIAGNOSIS — R29898 Other symptoms and signs involving the musculoskeletal system: Secondary | ICD-10-CM

## 2023-09-03 NOTE — Therapy (Signed)
OUTPATIENT OCCUPATIONAL THERAPY ORTHO TREATMENT NOTE  Patient Name: Sarah Cooper MRN: [id number] DOB:22-Nov-1950, 72 y.o., female Today's Date: 09/03/2023   END OF SESSION:  OT End of Session - 09/03/23 0928     Visit Number 7    Number of Visits 9    Date for OT Re-Evaluation 09/05/23    Authorization Type [employer] Medicare    Authorization Time Period 8 visits approved from 10/20-11/18    Authorization - Visit Number 6    Authorization - Number of Visits 8    OT Start Time 0848    OT Stop Time 0928    OT Time Calculation (min) 40 min    Activity Tolerance Patient tolerated treatment well    Behavior During Therapy WFL for tasks assessed/performed              Past Medical History:  Diagnosis Date   Arthritis    back, hips, knees   Cancer (HCC) 1978   cervical   CHF (congestive heart failure) (HCC)    Phreesia 11/28/2020   Hyperlipidemia 12/01/2020   Hypertension    Pneumothorax 1977   PONV (postoperative nausea and vomiting)    Past Surgical History:  Procedure Laterality Date   BREAST BIOPSY Left    approx @ 48 benign    collapsed lung on right      1970s   HERNIA REPAIR     age 48   HIP SURGERY  2007   right   JOINT REPLACEMENT  2007   TOTAL HIP ARTHROPLASTY Left 07/25/2020   Procedure: LEFT TOTAL HIP ARTHROPLASTY ANTERIOR APPROACH;  Surgeon: Marcene Corning, MD;  Location: [facility];  Service: Orthopedics;  Laterality: Left;   TOTAL KNEE ARTHROPLASTY Left 12/18/2021   Procedure: LEFT TOTAL KNEE ARTHROPLASTY;  Surgeon: Marcene Corning, MD;  Location: [facility];  Service: Orthopedics;  Laterality: Left;   Patient Active Problem List   Diagnosis Date Noted   Stage 3a chronic kidney disease (HCC) 10/05/2021   Muscle spasm 02/06/2021   Insomnia, unspecified 12/01/2020   Hyperlipidemia 12/01/2020   Chronic diastolic heart failure (HCC) 12/01/2020   Leg edema 12/01/2020   Primary localized osteoarthritis of left hip 07/25/2020   Tubular adenoma of colon  12/11/2016   Dyspnea 12/11/2016   Vitamin D deficiency 11/21/2016   Pre-diabetes 11/21/2016   Essential hypertension 11/19/2016   Depression, recurrent (HCC) 11/19/2016   Osteoarthritis 11/19/2016   Chronic back pain greater than 3 months duration 11/19/2016   History of cervical dysplasia 11/19/2016   Morbid obesity (HCC) 11/19/2016    PCP: Gaspar Garbe, MD REFERRING PROVIDER: Marcene Corning, MD  ONSET DATE: 07/10/23  REFERRING DIAG: R shoulder scope x2  THERAPY DIAG:  Acute pain of right shoulder  Shoulder stiffness, right  Other symptoms and signs involving the musculoskeletal system  Rationale for Evaluation and Treatment: Rehabilitation  SUBJECTIVE:   SUBJECTIVE STATEMENT: "It is still pretty bad" Pt accompanied by: self  PERTINENT HISTORY: Pt presents to OT s/p 2nd R rotator cuff scope due to R rotator cuff tear. No further PMH noted in patient chart.   PRECAUTIONS: Shoulder  WEIGHT BEARING RESTRICTIONS: No  PAIN:  Are you having pain? Yes: NPRS scale: 5/10 Pain location: anterior shoulder girdle Pain description: sore and discomfort Aggravating factors: sleeping Relieving factors: medication and positioning  FALLS: Has patient fallen in last 6 months? No  PLOF: Independent  PATIENT GOALS: "I want to be able to bend down and tie my shoes"  NEXT MD  VISIT: 09/24/23  OBJECTIVE:   HAND DOMINANCE: Right  ADLs: Overall ADLs: Pt able to complete BADL's with compensatory strategies, unable to lift her arm to wash her hair or reach over shoulder height.   FUNCTIONAL OUTCOME MEASURES: Upper Extremity Functional Scale (UEFS): 48.8%  UPPER EXTREMITY ROM:       Assessed in seated, er/IR adducted  Active ROM Right eval  Shoulder flexion 72  Shoulder abduction 78  Shoulder internal rotation 90  Shoulder external rotation 57  (Blank rows = not tested)    UPPER EXTREMITY MMT:     Assessed in seated, er/IR adducted  MMT Right eval   Shoulder flexion   Shoulder abduction   Shoulder internal rotation   Shoulder external rotation   (Blank rows = not tested)  SENSATION: WFL  EDEMA: moderate swelling around middle of the upper arm  OBSERVATIONS: moderate to severe fascial restrictions in the biceps, trapezius, and scapular region.   TODAY'S TREATMENT:                                                                                                                              DATE:   09/03/23 -Manual Therapy: myofascial release and trigger point applied to biceps, trapezius, scapular region, and axillary region in order to reduce pain and fascial restrictions, as well as improve ROM.  -AA/ROM: seated, flexion, abduction, protraction, horizontal abduction, er/IR, x12 -Wall washes, flexion, 60" -Pulleys: flexion, abduction, x60" -Scapular Strengthening: red band, extension, retraction, rows, x12 -Functional Reaching: flexion going up, abduction going down, 10 cones, counter to shoulder height shelf to head height shelf, then back down.   08/29/23 -Manual Therapy: myofascial release and trigger point applied to biceps, trapezius, scapular region, and axillary region in order to reduce pain and fascial restrictions, as well as improve ROM.  -AA/ROM: seated, flexion, abduction, protraction, horizontal abduction, er/IR, x10 -Wall Slides: flexion, abduction, x10 -Pulleys: flexion, abduction, x60"  08/27/23 -UBE: level 2, 2.5' forwards and backwards -Pulleys: flexion, abduction, x60" each -AA/ROM: seated, flexion, abduction, protraction, horizontal abduction, er/IR, x10 -A/ROM: seated, flexion, abduction, protraction, horizontal abduction, er/IR, x10 -Therapy Ball Exercises: green ball, flexion, protraction, overhead press, V ups, circles both directions, x10 -Ball On the Wall Stretch: Flexion, x10 -Wall Slide: abduction, x10   PATIENT EDUCATION: Education details: Continue HEP Person educated: Patient Education  method: Explanation, Demonstration, and Handouts Education comprehension: verbalized understanding and returned demonstration  HOME EXERCISE PROGRAM: 10/17: Table Slides 10/30: AA/ROM 11/6: A/ROM 11/13: Theraball Exercises  GOALS: Goals reviewed with patient? Yes   SHORT TERM GOALS: Target date: 09/05/23  Pt will be provided with and educated on HEP to improve mobility in RUE required for use during ADL completion.   Goal status: IN PROGRESS  Pt will decrease pain in RUE to 3/10 or less to improve ability to sleep for 2+ consecutive hours without waking due to pain.   Goal status: IN PROGRESS  2.  Pt will decrease RUE fascial  restrictions to min amounts or less to improve mobility required for functional reaching tasks.   Goal status: IN PROGRESS  3.  Pt will increase RUE A/ROM by 50 degrees to improve ability to use RUE when reaching overhead or behind back during dressing and bathing tasks.   Goal status: IN PROGRESS  4.  Pt will increase RUE strength to 4+/5 or greater to improve ability to use RUE when lifting or carrying items during meal preparation/housework/yardwork tasks.   Goal status: IN PROGRESS  5.  Pt will return to highest level of function using RUE as dominant during functional task completion.   Goal status: IN PROGRESS   ASSESSMENT:  CLINICAL IMPRESSION: Pt is continuing to have significant pain and discomfort along her trapezius and bicep areas. She demonstrates limited ROM with AA/ROM, however with continued mobility and functional tasks, she was able to reaching around 70% of full ROM towards the end of the session. Pain continues to be limiting factor throughout session. Verbal and tactile cuing provided for positioning and technique.   PERFORMANCE DEFICITS: in functional skills including in functional skills including ADLs, IADLs, coordination, tone, ROM, strength, pain, fascial restrictions, muscle spasms, and UE functional use.    PLAN:  OT  FREQUENCY: 2x/week  OT DURATION: 4 weeks  PLANNED INTERVENTIONS: self care/ADL training, therapeutic exercise, therapeutic activity, neuromuscular re-education, manual therapy, passive range of motion, splinting, electrical stimulation, ultrasound, moist heat, cryotherapy, patient/family education, and DME and/or AE instructions  RECOMMENDED OTHER SERVICES: N/A  CONSULTED AND AGREED WITH PLAN OF CARE: Patient  PLAN FOR NEXT SESSION: Manual Therapy, P/ROM, AA/ROM, A/ROM,  Wall Washes, start more resistance band exercises as tolerated   Trish Mage, OTR/L [facility] [phone] Kennyth Arnold, OT 09/03/2023, 9:29 AM

## 2023-09-05 ENCOUNTER — Ambulatory Visit (HOSPITAL_COMMUNITY): Payer: Medicare Other | Admitting: Occupational Therapy

## 2023-09-05 ENCOUNTER — Encounter (HOSPITAL_COMMUNITY): Payer: Self-pay | Admitting: Occupational Therapy

## 2023-09-05 DIAGNOSIS — M25511 Pain in right shoulder: Secondary | ICD-10-CM | POA: Diagnosis not present

## 2023-09-05 DIAGNOSIS — R29898 Other symptoms and signs involving the musculoskeletal system: Secondary | ICD-10-CM

## 2023-09-05 DIAGNOSIS — M25611 Stiffness of right shoulder, not elsewhere classified: Secondary | ICD-10-CM

## 2023-09-05 NOTE — Therapy (Unsigned)
OUTPATIENT OCCUPATIONAL THERAPY ORTHO TREATMENT NOTE  Patient Name: Sarah Cooper MRN: [id number] DOB:1950-12-02, 72 y.o., female Today's Date: 09/06/2023   END OF SESSION:    09/05/23 1144  OT Visits / Re-Eval  Visit Number 8  Number of Visits 16  Date for OT Re-Evaluation 10/10/23  Authorization  Authorization Type [employer] Medicare (Requesting 8 more visits)  Authorization Time Period 8 visits approved from 10/20-11/18  Authorization - Number of Visits 8  OT Time Calculation  OT Start Time 1106  OT Stop Time 1144  OT Time Calculation (min) 38 min  End of Session  Activity Tolerance Patient tolerated treatment well  Behavior During Therapy WFL for tasks assessed/performed     Past Medical History:  Diagnosis Date   Arthritis    back, hips, knees   Cancer (HCC) 1978   cervical   CHF (congestive heart failure) (HCC)    [employer]reesia 11/28/2020   Hyperlipidemia 12/01/2020   Hypertension    Pneumothorax 1977   PONV (postoperative nausea and vomiting)    Past Surgical History:  Procedure Laterality Date   BREAST BIOPSY Left    approx @ 48 benign    collapsed lung on right      1970s   HERNIA REPAIR     age 27   HIP SURGERY  2007   right   JOINT REPLACEMENT  2007   TOTAL HIP ARTHROPLASTY Left 07/25/2020   Procedure: LEFT TOTAL HIP ARTHROPLASTY ANTERIOR APPROACH;  Surgeon: Marcene Corning, MD;  Location: [facility];  Service: Orthopedics;  Laterality: Left;   TOTAL KNEE ARTHROPLASTY Left 12/18/2021   Procedure: LEFT TOTAL KNEE ARTHROPLASTY;  Surgeon: Marcene Corning, MD;  Location: [facility];  Service: Orthopedics;  Laterality: Left;   Patient Active Problem List   Diagnosis Date Noted   Stage 3a chronic kidney disease (HCC) 10/05/2021   Muscle spasm 02/06/2021   Insomnia, unspecified 12/01/2020   Hyperlipidemia 12/01/2020   Chronic diastolic heart failure (HCC) 12/01/2020   Leg edema 12/01/2020   Primary localized osteoarthritis of left hip 07/25/2020   Tubular  adenoma of colon 12/11/2016   Dyspnea 12/11/2016   Vitamin D deficiency 11/21/2016   Pre-diabetes 11/21/2016   Essential hypertension 11/19/2016   Depression, recurrent (HCC) 11/19/2016   Osteoarthritis 11/19/2016   Chronic back pain greater than 3 months duration 11/19/2016   History of cervical dysplasia 11/19/2016   Morbid obesity (HCC) 11/19/2016    PCP: Gaspar Garbe, MD REFERRING PROVIDER: Marcene Corning, MD  ONSET DATE: 07/10/23  REFERRING DIAG: R shoulder scope x2  THERAPY DIAG:  Acute pain of right shoulder  Shoulder stiffness, right  Other symptoms and signs involving the musculoskeletal system  Rationale for Evaluation and Treatment: Rehabilitation  SUBJECTIVE:   SUBJECTIVE STATEMENT: "It is still pretty bad" Pt accompanied by: self  PERTINENT HISTORY: Pt presents to OT s/p 2nd R rotator cuff scope due to R rotator cuff tear. No further PMH noted in patient chart.   PRECAUTIONS: Shoulder  WEIGHT BEARING RESTRICTIONS: No  PAIN:  Are you having pain? Yes: NPRS scale: 5/10 Pain location: anterior shoulder girdle Pain description: sore and discomfort Aggravating factors: sleeping Relieving factors: medication and positioning  FALLS: Has patient fallen in last 6 months? No  PLOF: Independent  PATIENT GOALS: "I want to be able to bend down and tie my shoes"  NEXT MD VISIT: 09/24/23  OBJECTIVE:   HAND DOMINANCE: Right  ADLs: Overall ADLs: Pt able to complete BADL's with compensatory strategies, unable to lift  her arm to wash her hair or reach over shoulder height.   FUNCTIONAL OUTCOME MEASURES: Upper Extremity Functional Scale (UEFS): 48.8%  UPPER EXTREMITY ROM:       Assessed in seated, er/IR adducted  Active ROM Right eval Right 09/05/23  Shoulder flexion 72 129  Shoulder abduction 78 141  Shoulder internal rotation 90 90  Shoulder external rotation 57 62  (Blank rows = not tested)    UPPER EXTREMITY MMT:     Assessed in  seated, er/IR adducted  MMT Right eval Right 09/05/23  Shoulder flexion  4/5  Shoulder abduction  4/5  Shoulder internal rotation  4+/5  Shoulder external rotation  4/5  (Blank rows = not tested)  SENSATION: WFL  EDEMA: moderate swelling around middle of the upper arm  OBSERVATIONS: moderate to severe fascial restrictions in the biceps, trapezius, and scapular region.   TODAY'S TREATMENT:                                                                                                                              DATE:   09/05/23 -Manual Therapy: myofascial release and trigger point applied to biceps, trapezius, scapular region, and axillary region in order to reduce pain and fascial restrictions, as well as improve ROM.  -P/ROM: flexion, abduction, er/IR, x10 -A/ROM: flexion, abduction, protraction, horizontal abduction, er/IR, x10 -Pulleys: flexion, abduction, x60" -Measurements for Reassessment  09/03/23 -Manual Therapy: myofascial release and trigger point applied to biceps, trapezius, scapular region, and axillary region in order to reduce pain and fascial restrictions, as well as improve ROM.  -AA/ROM: seated, flexion, abduction, protraction, horizontal abduction, er/IR, x12 -Wall washes, flexion, 60" -Pulleys: flexion, abduction, x60" -Scapular Strengthening: red band, extension, retraction, rows, x12 -Functional Reaching: flexion going up, abduction going down, 10 cones, counter to shoulder height shelf to head height shelf, then back down.   08/29/23 -Manual Therapy: myofascial release and trigger point applied to biceps, trapezius, scapular region, and axillary region in order to reduce pain and fascial restrictions, as well as improve ROM.  -AA/ROM: seated, flexion, abduction, protraction, horizontal abduction, er/IR, x10 -Wall Slides: flexion, abduction, x10 -Pulleys: flexion, abduction, x60"   PATIENT EDUCATION: Education details: Continue HEP Person educated:  Patient Education method: Explanation, Demonstration, and Handouts Education comprehension: verbalized understanding and returned demonstration  HOME EXERCISE PROGRAM: 10/17: Table Slides 10/30: AA/ROM 11/6: A/ROM 11/13: Theraball Exercises  GOALS: Goals reviewed with patient? Yes   SHORT TERM GOALS: Target date: 09/05/23  Pt will be provided with and educated on HEP to improve mobility in RUE required for use during ADL completion.   Goal status: IN PROGRESS  Pt will decrease pain in RUE to 3/10 or less to improve ability to sleep for 2+ consecutive hours without waking due to pain.   Goal status: IN PROGRESS  2.  Pt will decrease RUE fascial restrictions to min amounts or less to improve mobility required for functional reaching tasks.   Goal status:  IN PROGRESS  3.  Pt will increase RUE A/ROM by 50 degrees to improve ability to use RUE when reaching overhead or behind back during dressing and bathing tasks.   Goal status: IN PROGRESS  4.  Pt will increase RUE strength to 4+/5 or greater to improve ability to use RUE when lifting or carrying items during meal preparation/housework/yardwork tasks.   Goal status: IN PROGRESS  5.  Pt will return to highest level of function using RUE as dominant during functional task completion.   Goal status: IN PROGRESS   ASSESSMENT:  CLINICAL IMPRESSION: This session pt completed measurements for reassessment. She is demonstrating improved ROM and strength, however she continues to have increased pain. OT providing continued verbal and tactile cuing for positioning and technique. Pt will continue to benefit from skilled OT for 4 more weeks.    PERFORMANCE DEFICITS: in functional skills including in functional skills including ADLs, IADLs, coordination, tone, ROM, strength, pain, fascial restrictions, muscle spasms, and UE functional use.    PLAN:  OT FREQUENCY: 2x/week  OT DURATION: 4 weeks  PLANNED INTERVENTIONS: self  care/ADL training, therapeutic exercise, therapeutic activity, neuromuscular re-education, manual therapy, passive range of motion, splinting, electrical stimulation, ultrasound, moist heat, cryotherapy, patient/family education, and DME and/or AE instructions  RECOMMENDED OTHER SERVICES: N/A  CONSULTED AND AGREED WITH PLAN OF CARE: Patient  PLAN FOR NEXT SESSION: Manual Therapy, P/ROM, AA/ROM, A/ROM,  Wall Washes, start more resistance band exercises as tolerated   Trish Mage, OTR/L [facility] [phone] Debar Plate Rosemarie Beath, OT 09/06/2023, 8:41 PM  [facility] Medicare Auth Request Information  Date of referral: 07/30/23 Referring provider: Marcene Corning, MD Referring diagnosis (ICD 10)? Z53.33 Treatment diagnosis (ICD 10)? (if different than referring diagnosis) M25.511, M25.611, R29.898  Functional Tool Score: UEFS - 48/80  What was this (referring dx) caused by? Surgery (Type: R shoulder arthroscopy)  Nature of Condition: Initial Onset (within last 3 months)   Laterality: Rt  Current Functional Measure Score: Other UEFS - 48/80  Objective measurements identify impairments when they are compared to normal values, the uninvolved extremity, and prior level of function.  [x]  Yes  []  No  Objective assessment of functional ability: Moderate functional limitations   Briefly describe symptoms: Sharp and frequent pains and limited mobility  How did symptoms start: s/p surgery  Average pain intensity:  Last 24 hours: 5/10  Past week: 5/10  How often does the pt experience symptoms? Frequently  How much have the symptoms interfered with usual daily activities? Moderately  How has condition changed since care began at this facility? A little better  In general, how is the patients overall health? Good   BACK PAIN (STarT Back Screening Tool) No

## 2023-09-15 ENCOUNTER — Ambulatory Visit (HOSPITAL_COMMUNITY): Payer: Medicare Other | Admitting: Occupational Therapy

## 2023-09-18 ENCOUNTER — Encounter (HOSPITAL_COMMUNITY): Payer: Self-pay | Admitting: Occupational Therapy

## 2023-09-22 ENCOUNTER — Telehealth (HOSPITAL_COMMUNITY): Payer: Self-pay | Admitting: Occupational Therapy

## 2023-09-22 ENCOUNTER — Encounter (HOSPITAL_COMMUNITY): Payer: Self-pay | Admitting: Occupational Therapy

## 2023-09-22 NOTE — Telephone Encounter (Signed)
OT called and spoke with patient regarding her No Show on 09/22/23 at 930a. Pt reported that she was sick and forgot her appt. OT provided reminder of next visit on 12/12 at 930a.  Trish Mage, OTR/L [facility] [phone]

## 2023-09-24 ENCOUNTER — Telehealth (HOSPITAL_COMMUNITY): Payer: Self-pay | Admitting: Occupational Therapy

## 2023-09-24 NOTE — Telephone Encounter (Signed)
Patient called regarding her recent MD visits. Dr. Jerl Santos instructed pt to take a break from therapy to allow the swelling and pain in the shoulder to decrease, as well as pt starting steroids to assist with decreasing inflammation. OT and pt agreed for pt to call back when she was ready to restart therapy and they would complete a reassessment/re-evaluation.  Trish Mage, OTR/L [facility] [phone]

## 2023-09-25 ENCOUNTER — Encounter (HOSPITAL_COMMUNITY): Payer: Self-pay | Admitting: Occupational Therapy

## 2023-11-04 ENCOUNTER — Other Ambulatory Visit (HOSPITAL_COMMUNITY)
Admission: RE | Admit: 2023-11-04 | Discharge: 2023-11-04 | Disposition: A | Discharge status: Home / Self Care | Payer: Medicare Other | Source: Ambulatory Visit | Attending: Obstetrics and Gynecology | Admitting: Obstetrics and Gynecology

## 2023-11-04 ENCOUNTER — Other Ambulatory Visit: Payer: Self-pay | Admitting: Obstetrics and Gynecology

## 2023-11-04 DIAGNOSIS — Z1151 Encounter for screening for human papillomavirus (HPV): Secondary | ICD-10-CM | POA: Diagnosis not present

## 2023-11-04 DIAGNOSIS — Z01411 Encounter for gynecological examination (general) (routine) with abnormal findings: Secondary | ICD-10-CM | POA: Diagnosis present

## 2023-11-04 DIAGNOSIS — Z01419 Encounter for gynecological examination (general) (routine) without abnormal findings: Secondary | ICD-10-CM | POA: Insufficient documentation

## 2023-11-07 LAB — CYTOLOGY - PAP
Comment: NEGATIVE
Diagnosis: NEGATIVE
High risk HPV: NEGATIVE

## 2024-01-29 ENCOUNTER — Other Ambulatory Visit: Payer: Self-pay

## 2024-01-29 DIAGNOSIS — E785 Hyperlipidemia, unspecified: Secondary | ICD-10-CM

## 2024-01-29 MED ORDER — ATORVASTATIN CALCIUM 10 MG PO TABS: 10.0000 mg | ORAL_TABLET | Freq: Every day | ORAL | 0 refills | Status: DC

## 2024-02-13 ENCOUNTER — Other Ambulatory Visit: Payer: Self-pay | Admitting: Internal Medicine

## 2024-02-13 DIAGNOSIS — Z1231 Encounter for screening mammogram for malignant neoplasm of breast: Secondary | ICD-10-CM

## 2024-02-20 ENCOUNTER — Ambulatory Visit
Admission: RE | Admit: 2024-02-20 | Discharge: 2024-02-20 | Disposition: A | Discharge status: Home / Self Care | Source: Ambulatory Visit | Attending: Internal Medicine | Admitting: Internal Medicine

## 2024-02-20 DIAGNOSIS — Z1231 Encounter for screening mammogram for malignant neoplasm of breast: Secondary | ICD-10-CM

## 2024-03-03 ENCOUNTER — Other Ambulatory Visit: Payer: Self-pay | Admitting: Cardiology

## 2024-03-03 DIAGNOSIS — E785 Hyperlipidemia, unspecified: Secondary | ICD-10-CM

## 2024-03-14 IMAGING — MG MM DIGITAL SCREENING BILAT W/ TOMO AND CAD
8 series · 8 of 24 positions shown · non-contrast
Comparison: Previous exam(s).

CLINICAL DATA: Screening.

EXAM:
DIGITAL SCREENING BILATERAL MAMMOGRAM WITH TOMOSYNTHESIS AND CAD
TECHNIQUE: Bilateral screening digital craniocaudal and mediolateral oblique
mammograms were obtained. Bilateral screening digital breast
tomosynthesis was performed. The images were evaluated with
computer-aided detection.

[L CC synth-2D]
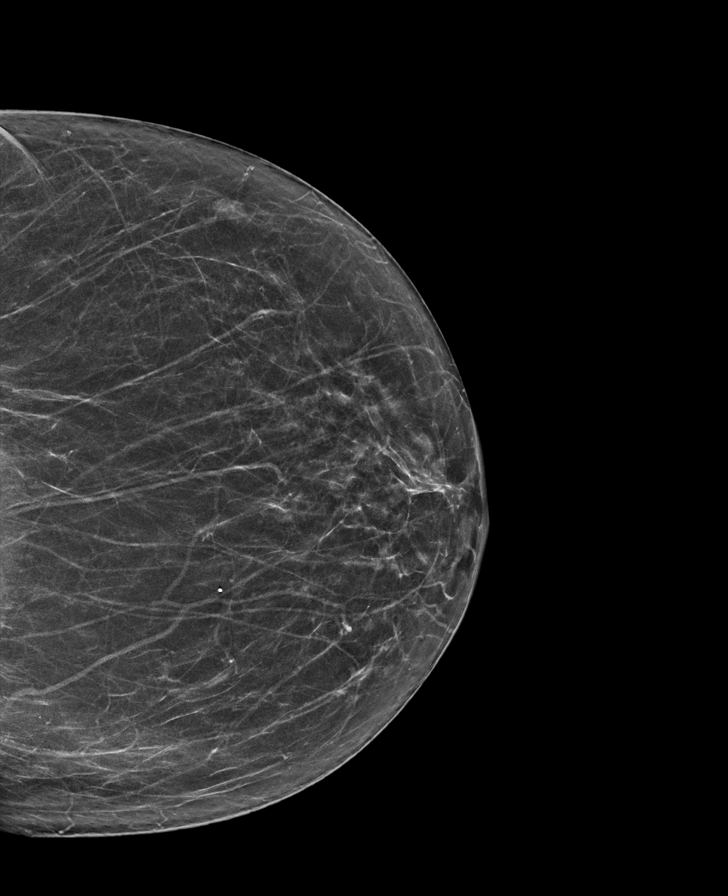

[R CC synth-2D]
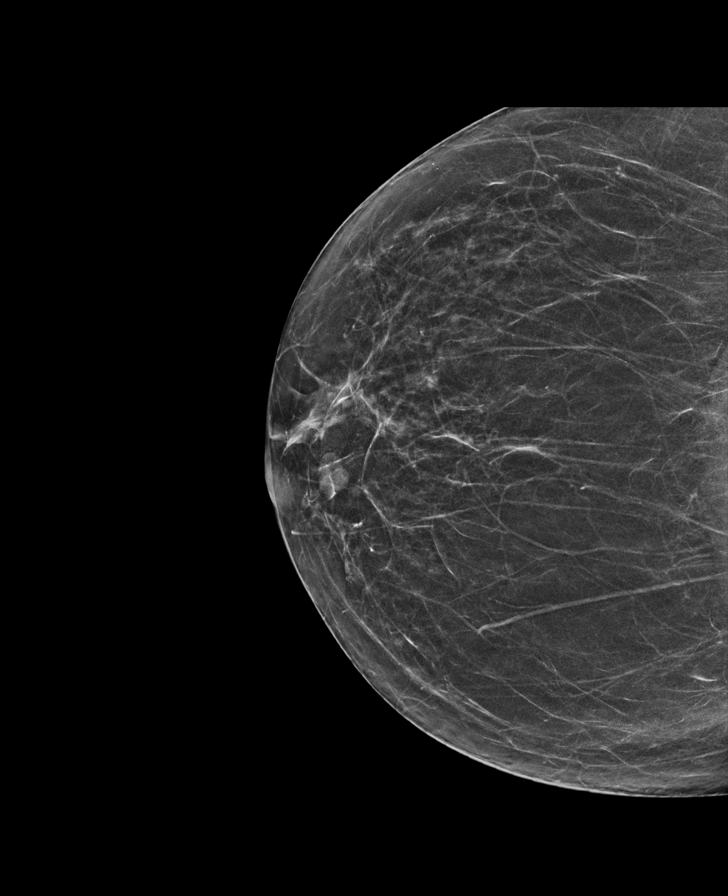

[R MLO synth-2D]
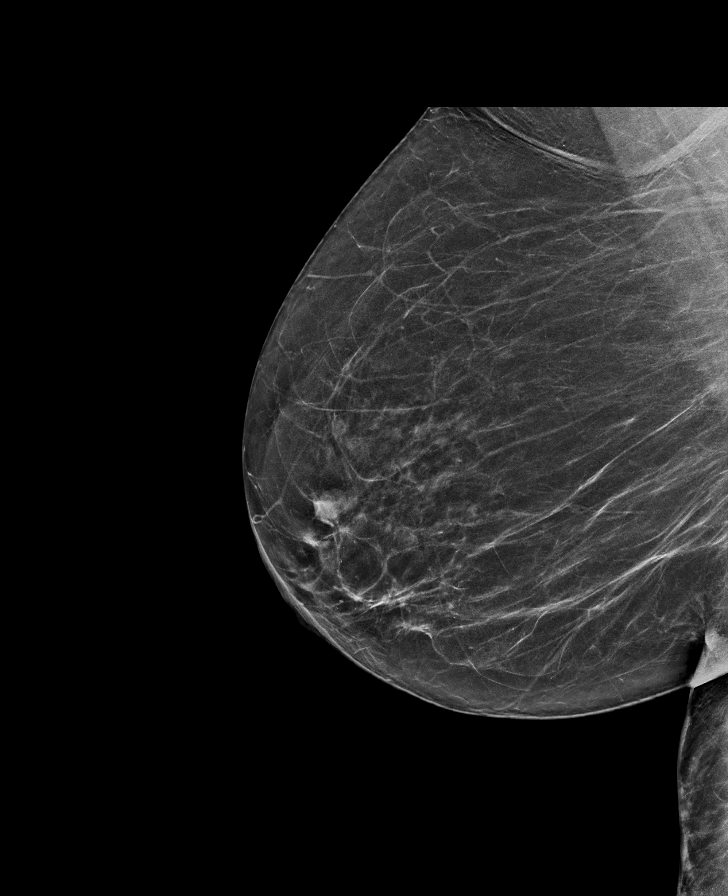

[L MLO synth-2D]
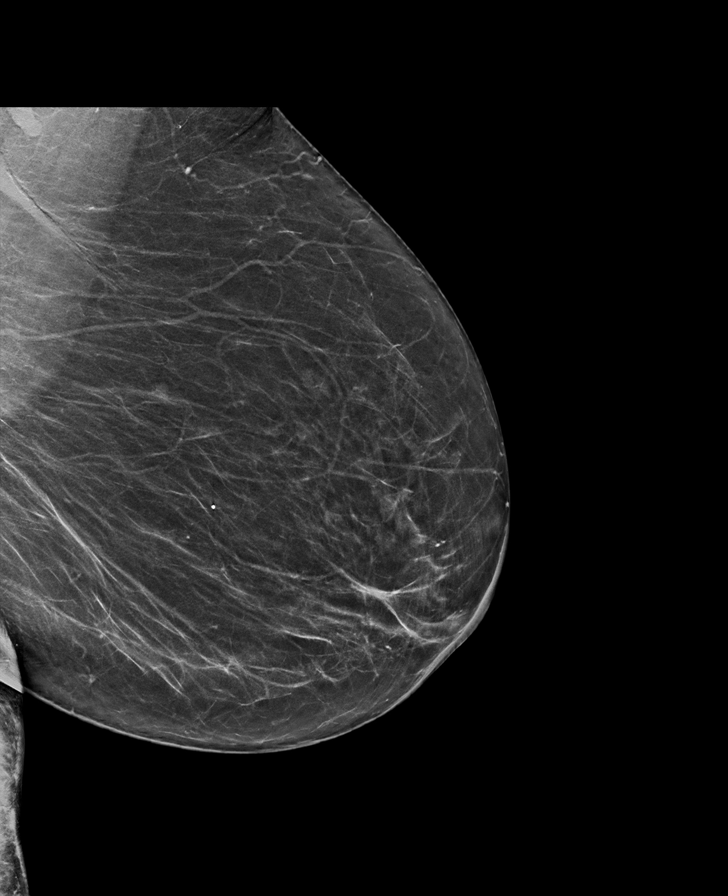

[R CC tomo · tomo slice 26/51.0]
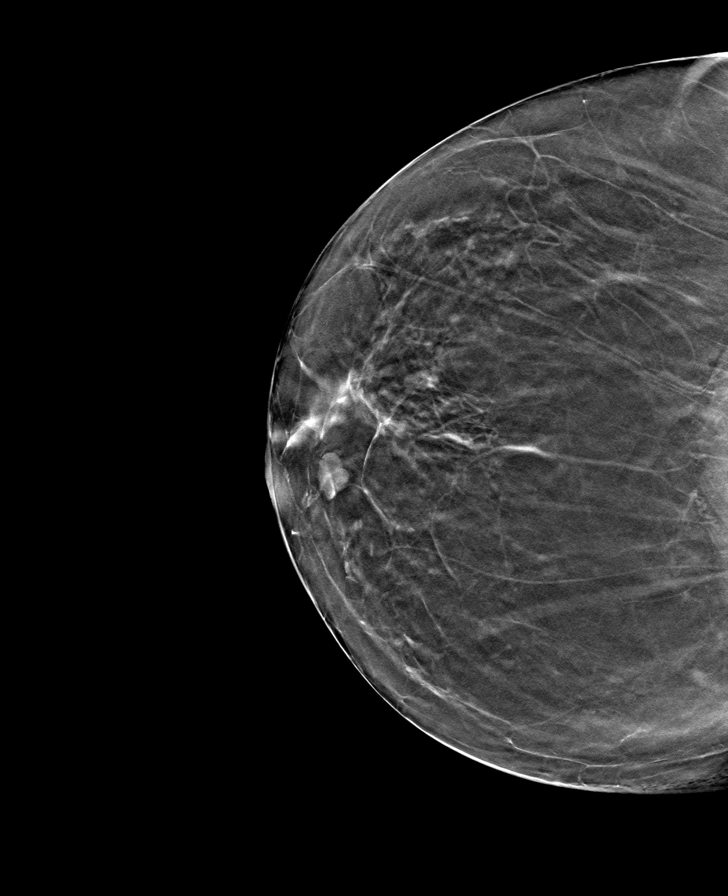

[R MLO tomo · tomo slice 33/64.0]
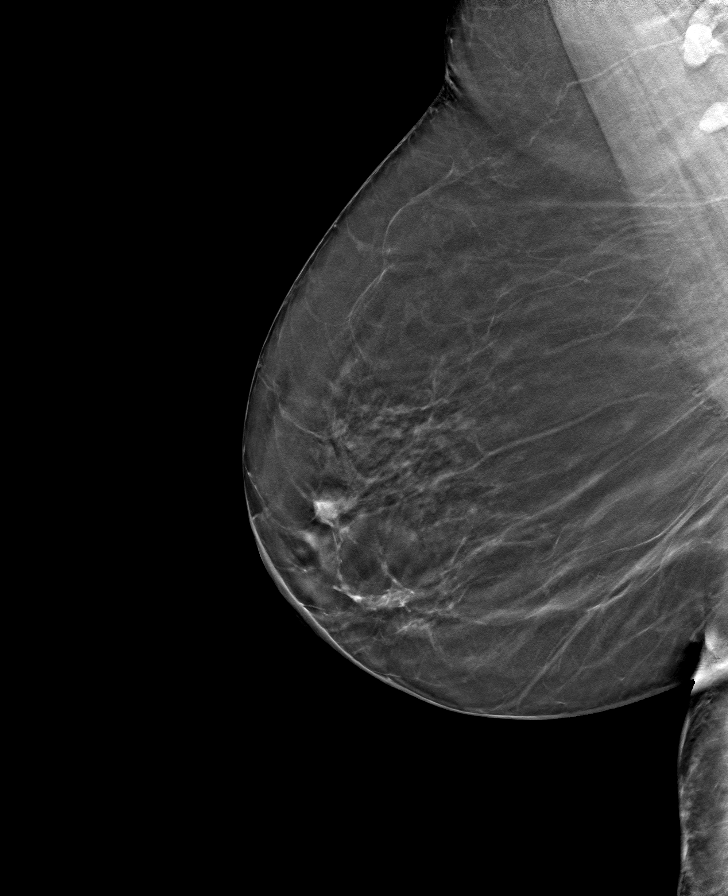

[L MLO tomo · tomo slice 35/68.0]
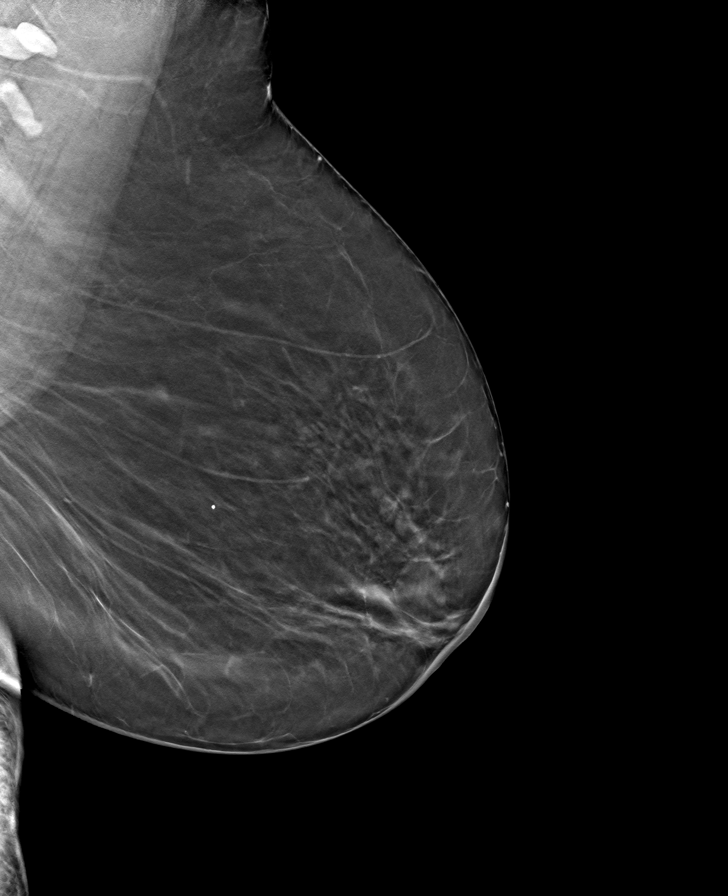

[L CC tomo · tomo slice 28/55.0]
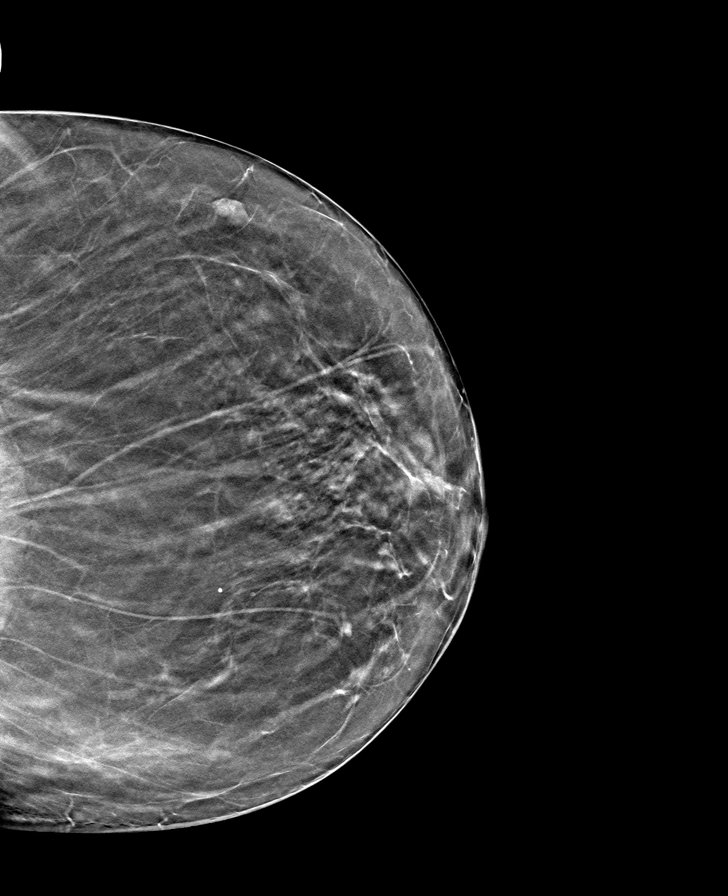

[8 of 24 positions shown; findings below may reference images not displayed]

ACR Breast Density Category b: There are scattered areas of
fibroglandular density.
FINDINGS: There are no findings suspicious for malignancy.
IMPRESSION: No mammographic evidence of malignancy. A result letter of this
screening mammogram will be mailed directly to the patient.

RECOMMENDATION:
Screening mammogram in one year. (Code:51-O-LD2)

BI-RADS CATEGORY  1: Negative.

## 2024-03-17 ENCOUNTER — Other Ambulatory Visit: Payer: Self-pay

## 2024-03-17 DIAGNOSIS — E785 Hyperlipidemia, unspecified: Secondary | ICD-10-CM

## 2024-03-17 MED ORDER — ATORVASTATIN CALCIUM 10 MG PO TABS: 10.0000 mg | ORAL_TABLET | Freq: Every day | ORAL | 0 refills | Status: DC

## 2024-04-06 ENCOUNTER — Other Ambulatory Visit: Payer: Self-pay | Admitting: Cardiology

## 2024-04-06 DIAGNOSIS — E785 Hyperlipidemia, unspecified: Secondary | ICD-10-CM

## 2024-05-05 ENCOUNTER — Other Ambulatory Visit: Payer: Self-pay

## 2024-05-05 DIAGNOSIS — E785 Hyperlipidemia, unspecified: Secondary | ICD-10-CM

## 2024-05-05 MED ORDER — ATORVASTATIN CALCIUM 10 MG PO TABS: 10.0000 mg | ORAL_TABLET | Freq: Every day | ORAL | 0 refills | Status: DC

## 2024-05-21 ENCOUNTER — Other Ambulatory Visit: Payer: Self-pay

## 2024-05-21 DIAGNOSIS — E785 Hyperlipidemia, unspecified: Secondary | ICD-10-CM

## 2024-07-07 ENCOUNTER — Ambulatory Visit: Admitting: Orthopaedic Surgery

## 2024-07-28 ENCOUNTER — Encounter: Payer: Self-pay | Admitting: Cardiology

## 2024-07-28 ENCOUNTER — Ambulatory Visit: Attending: Cardiovascular Disease | Admitting: Cardiology

## 2024-07-28 VITALS — BP 103/58 | HR 98 | Resp 16 | Ht 66.0 in | Wt 196.1 lb

## 2024-07-28 DIAGNOSIS — I10 Essential (primary) hypertension: Secondary | ICD-10-CM | POA: Diagnosis not present

## 2024-07-28 DIAGNOSIS — R0989 Other specified symptoms and signs involving the circulatory and respiratory systems: Secondary | ICD-10-CM

## 2024-07-28 DIAGNOSIS — E785 Hyperlipidemia, unspecified: Secondary | ICD-10-CM

## 2024-07-28 DIAGNOSIS — I5032 Chronic diastolic (congestive) heart failure: Secondary | ICD-10-CM | POA: Diagnosis not present

## 2024-07-28 MED ORDER — ATORVASTATIN CALCIUM 20 MG PO TABS: 20.0000 mg | ORAL_TABLET | Freq: Every day | ORAL | 0 refills | Status: DC

## 2024-07-28 NOTE — Patient Instructions (Signed)
 Medication Instructions:  INCREASE Lipitor 20 mg- Take 1 tablet (20 mg total) by mouth daily  *If you need a refill on your cardiac medications before your next appointment, please call your pharmacy*  Lab Work: NONE If you have labs (blood work) drawn today and your tests are completely normal, you will receive your results only by: MyChart Message (if you have MyChart) OR A paper copy in the mail If you have any lab test that is abnormal or we need to change your treatment, we will call you to review the results.  Testing/Procedures: Carotid Ultrasound Your physician has requested that you have a carotid duplex. This test is an ultrasound of the carotid arteries in your neck. It looks at blood flow through these arteries that supply the brain with blood. Allow one hour for this exam. There are no restrictions or special instructions.   Follow-Up: At [facility], you and your health needs are our priority.  As part of our continuing mission to provide you with exceptional heart care, our providers are all part of one team.  This team includes your primary Cardiologist (physician) and Advanced Practice Providers or APPs (Physician Assistants and Nurse Practitioners) who all work together to provide you with the care you need, when you need it.  Your next appointment:   1 Year  Provider:   Gordy Bergamo, MD    We recommend signing up for the patient portal called MyChart.  Sign up information is provided on this After Visit Summary.  MyChart is used to connect with patients for Virtual Visits (Telemedicine).  Patients are able to view lab/test results, encounter notes, upcoming appointments, etc.  Non-urgent messages can be sent to your provider as well.   To learn more about what you can do with MyChart, go to [email or web address].

## 2024-07-28 NOTE — Progress Notes (Signed)
 Cardiology Office Note:  .   Date:  07/28/2024  ID:  Sarah Cooper, DOB [birth year], MRN [id number] PCP: Vernadine Charlie ORN, MD  [facility] Providers Cardiologist:  Gordy Bergamo, MD   History of Present Illness: .   Sarah Cooper is a 73 y.o. African American female with hypertension,  mild hyperlipidemia, prediabetes mellitus, insomnia, bilateral mild leg edema secondary to varicose veins, degenerative arthritis, morbid obesity, chronic dyspnea on exertion presents here for annual visit of hypertension and chronic diastolic heart failure.  I had last seen her on 12/03/2021, also wanted to know about heart failure and her diagnosis and risk modification.  She has been exercising regularly and except for mild chronic dyspnea she has not had any leg edema, PND or orthopnea.  No recent hospitalization.  She has had nonischemic low risk nuclear stress test on 07/10/2020 and echocardiogram revealed normal LVEF with mild LVH and normal diastolic function, moderate AI was noted on 04/06/2020.    Cardiac Studies relevent.    Lexiscan  nuclear stress test 07/10/2020: Normal myocardial perfusion, EF 57%.  Echocardiogram 04/06/2020: Technically difficult study, normal LVEF at 55 to 60% with mild LVH and normal diastolic function.  Moderate AI and mild TR and no evidence of pulm hypertension.    Discussed the use of AI scribe software for clinical note transcription with the patient, who gave verbal consent to proceed.  History of Present Illness Sarah Cooper is a 74 year old female with chronic diastolic heart failure who presents with shortness of breath and heart palpitations. She is accompanied by her granddaughter. She was referred by Dr. Tisovec for evaluation of her chronic diastolic heart failure.  She experiences occasional shortness of breath and a dull, twisting sensation in her heart, lasting about 10-15 seconds, primarily when walking. These symptoms do not occur  during treadmill exercise or while sleeping. She denies waking up short of breath.  She engages in regular physical activity, walking on the treadmill at the [facility] for an hour at a speed of 2.5 with a 1.5 incline. She is working to regain her previous fitness level after a recent illness.  Her current medications include lisinopril  HCT 20/25 mg once daily, spironolactone  25 mg once daily, amlodipine  10 mg once daily, and atorvastatin  10 mg daily.  Labs   Care everywhere/Faxed External Labs:  Labs 06/05/2024:  hemoglobin at 10.9, hematocrit at 33.2, platelet count at 270,   sodium at 138, potassium at 4.5, BUN at 21, and eGFR at 58.7 mL/min.   Total cholesterol at 172, triglycerides at 75, HDL at 77, and LDL at 80.  ROS  Review of Systems  Cardiovascular:  Positive for dyspnea on exertion (Stable). Negative for chest pain, leg swelling, orthopnea and paroxysmal nocturnal dyspnea.   Physical Exam:   VS:  BP (!) 103/58 (BP Location: Left Arm, Patient Position: Sitting, Cuff Size: Large)   Pulse 98   Resp 16   Ht 5' 6 (1.676 m)   Wt 196 lb 1.6 oz (89 kg)   LMP 10/15/2007 (Approximate)   SpO2 96%   BMI 31.65 kg/m    Wt Readings from Last 3 Encounters:  07/28/24 196 lb 1.6 oz (89 kg)  12/18/21 214 lb (97.1 kg)  12/10/21 214 lb (97.1 kg)    BP Readings from Last 3 Encounters:  07/28/24 (!) 103/58  12/18/21 129/73  12/10/21 140/65   Physical Exam Constitutional:      Appearance: She is obese.  Neck:  Vascular: No JVD.  Cardiovascular:     Rate and Rhythm: Normal rate and regular rhythm.     Pulses: Intact distal pulses.          Carotid pulses are  on the right side with bruit and  on the left side with bruit.    Heart sounds: Normal heart sounds. No murmur heard.    No gallop.  Pulmonary:     Effort: Pulmonary effort is normal.     Breath sounds: Normal breath sounds.  Abdominal:     General: Bowel sounds are normal.     Palpations: Abdomen is soft.   Musculoskeletal:     Right lower leg: No edema.     Left lower leg: No edema.    EKG:    EKG Interpretation Date/Time:  Wednesday July 28 2024 11:17:46 EDT Ventricular Rate:  97 PR Interval:  158 QRS Duration:  90 QT Interval:  328 QTC Calculation: 416 R Axis:   47  Text Interpretation: EKG 07/28/2024: Normal sinus rhythm at rate of 97 bpm, normal axis, poor R wave progression, probably normal variant.  No evidence of ischemia, normal QT interval. Confirmed by Naria Abbey, Jagadeesh [phone]) on 07/28/2024 12:21:10 PM    ASSESSMENT AND PLAN: .      ICD-10-CM   1. Chronic diastolic congestive heart failure (HCC)  I50.32 EKG 12-Lead    2. Mild hyperlipidemia  E78.5 atorvastatin  (LIPITOR) 20 MG tablet    3. Primary hypertension  I10     4. Bilateral carotid bruits  R09.89 atorvastatin  (LIPITOR) 20 MG tablet    VAS US  CAROTID     Assessment & Plan Chronic diastolic heart failure Chronic diastolic heart failure due to hypertension, obesity, and aging. The heart exhibits good systolic function but impaired diastolic relaxation due to fibrous tissue replacement. Symptoms include occasional dyspnea and transient chest discomfort. No hospitalizations or emergency visits related to heart failure. Current medications are effective. - Encourage regular exercise and weight loss to improve heart relaxation and function. - Continue current medications: lisinopril  HCT, spironolactone , and amlodipine .  Essential hypertension Hypertension is well-managed with current medications. Blood pressure control is crucial for managing heart failure and preventing further complications. - Continue current antihypertensive medications: lisinopril  HCT, spironolactone , and amlodipine .  Mild carotid artery disease Mild carotid artery disease with previous mild blockage in the right carotid artery and plaque buildup on the left. Bruit auscultated bilaterally. Previous carotid duplex showed mild disease.  Further assessment is needed to evaluate current status. - Order carotid duplex ultrasound to assess current status of carotid arteries.  Hyperlipidemia Hyperlipidemia with an LDL of 80, slightly higher than desired. Current treatment includes atorvastatin  with no reported side effects. Increasing atorvastatin  dose is recommended to better control cholesterol levels. - Increase atorvastatin  dose to 20 mg daily. - Send a new prescription for atorvastatin  20 mg to [facility]. - Monitor cholesterol levels and adjust treatment as necessary.   Follow up: 1 year for chronic diastolic heart failure, carotid bruit, if she remains stable on a as needed basis.  Signed,  Gordy Bergamo, MD, [facility] 07/28/2024, 6:43 PM [facility] [address] [place], [place] [address] Phone: [phone]. Fax:  [phone]

## 2024-08-11 ENCOUNTER — Ambulatory Visit: Admitting: Orthopaedic Surgery

## 2024-08-16 ENCOUNTER — Encounter: Payer: Self-pay | Admitting: Radiology

## 2024-08-19 ENCOUNTER — Ambulatory Visit (HOSPITAL_COMMUNITY)

## 2024-08-30 ENCOUNTER — Ambulatory Visit (HOSPITAL_COMMUNITY)
Admission: RE | Admit: 2024-08-30 | Discharge: 2024-08-30 | Disposition: A | Discharge status: Home / Self Care | Source: Ambulatory Visit | Attending: Cardiology | Admitting: Cardiology

## 2024-08-30 DIAGNOSIS — R0989 Other specified symptoms and signs involving the circulatory and respiratory systems: Secondary | ICD-10-CM | POA: Insufficient documentation

## 2024-08-31 ENCOUNTER — Ambulatory Visit: Payer: Self-pay | Admitting: Cardiology

## 2024-08-31 NOTE — Progress Notes (Signed)
 Carotid artery duplex 08/31/2023: Bilateral ICA 1 to 39% stenosis with heterogenous plaque. Bilateral antegrade vertebral artery flow. Normal subclavian hemodynamics.  Overall very minimal disease is evident, no further surveillance is indicated.  Continue present medications.

## 2024-09-29 ENCOUNTER — Telehealth: Payer: Self-pay | Admitting: Cardiology

## 2024-09-29 DIAGNOSIS — R072 Precordial pain: Secondary | ICD-10-CM

## 2024-09-29 DIAGNOSIS — R0609 Other forms of dyspnea: Secondary | ICD-10-CM

## 2024-09-29 NOTE — Telephone Encounter (Signed)
 Patient reports she has been experiencing worsening SOB with activity over the last 2 weeks. She reports she also had an episode of mild chest pain the other night, it lasted 2-3 minutes before going away. She states this scared her because she had never felt it like that before.  She reports swelling to her ankles. Denies any swelling in abdomen.  Reviewed medication list, she is taking everything as prescribed.  Will forward to Dr. Ladona to review for advisement.

## 2024-09-29 NOTE — Telephone Encounter (Signed)
 Pt c/o Shortness Of Breath: STAT if SOB developed within the last 24 hours or pt is noticeably SOB on the phone  1. Are you currently SOB (can you hear that pt is SOB on the phone)? No   2. How long have you been experiencing SOB? More frequent 2 weeks ago   3. Are you SOB when sitting or when up moving around? Moving around, walking   4. Are you currently experiencing any other symptoms?  No

## 2024-09-29 NOTE — Telephone Encounter (Signed)
 Please schedule her for nuclear stress with lexi. Indication Precordial pain and dyspnea on exertion and visit with one of us 

## 2024-09-30 NOTE — Telephone Encounter (Signed)
 Spoke with patient and shared recommendation per Dr. Ladona: Please schedule her for nuclear stress with lexi. Indication Precordial pain and dyspnea on exertion and visit with one of us    Reviewed instructions with patient and sent via MyChart message. Informed patient a scheduler will contact her to schedule an appointment for this test.  Patient verbalized understanding and expressed appreciation for call.

## 2024-09-30 NOTE — Telephone Encounter (Signed)
 ICD-10-CM   1. DOE (dyspnea on exertion)  R06.09 MYOCARDIAL PERFUSION IMAGING    Cardiac Stress Test: Informed Consent Details: Physician/Practitioner Attestation; Transcribe to consent form and obtain patient signature    2. Precordial pain  R07.2 MYOCARDIAL PERFUSION IMAGING    Cardiac Stress Test: Informed Consent Details: Physician/Practitioner Attestation; Transcribe to consent form and obtain patient signature

## 2024-10-11 ENCOUNTER — Telehealth (HOSPITAL_COMMUNITY): Payer: Self-pay

## 2024-10-11 NOTE — Telephone Encounter (Signed)
 Spoke to the patient, detailed instructions given. S.Lottie Siska CCT

## 2024-10-13 ENCOUNTER — Other Ambulatory Visit: Payer: Self-pay | Admitting: Cardiology

## 2024-10-13 DIAGNOSIS — R0989 Other specified symptoms and signs involving the circulatory and respiratory systems: Secondary | ICD-10-CM

## 2024-10-13 DIAGNOSIS — R0609 Other forms of dyspnea: Secondary | ICD-10-CM

## 2024-10-13 DIAGNOSIS — E785 Hyperlipidemia, unspecified: Secondary | ICD-10-CM

## 2024-10-13 DIAGNOSIS — R072 Precordial pain: Secondary | ICD-10-CM

## 2024-10-19 ENCOUNTER — Ambulatory Visit: Payer: Self-pay | Admitting: Cardiology

## 2024-10-19 ENCOUNTER — Ambulatory Visit (HOSPITAL_COMMUNITY)
Admission: RE | Admit: 2024-10-19 | Discharge: 2024-10-19 | Disposition: A | Discharge status: Home / Self Care | Source: Ambulatory Visit | Attending: Cardiology | Admitting: Cardiology

## 2024-10-19 DIAGNOSIS — R0609 Other forms of dyspnea: Secondary | ICD-10-CM | POA: Insufficient documentation

## 2024-10-19 DIAGNOSIS — R072 Precordial pain: Secondary | ICD-10-CM | POA: Diagnosis not present

## 2024-10-19 LAB — MYOCARDIAL PERFUSION IMAGING
LV dias vol: 104 mL (ref 46–106)
LV sys vol: 41 mL
Nuc Stress EF: 61 %
Peak HR: 92 {beats}/min
Rest HR: 76 {beats}/min
Rest Nuclear Isotope Dose: 9.8 mCi
SDS: 0
SRS: 4
SSS: 4
ST Depression (mm): 0 mm
Stress Nuclear Isotope Dose: 31.6 mCi
TID: 1.21

## 2024-10-19 MED ORDER — TECHNETIUM TC 99M TETROFOSMIN IV KIT
31.6000 | PACK | Freq: Once | INTRAVENOUS | Status: AC | PRN
  Administered 2024-10-19: 31.6 via INTRAVENOUS

## 2024-10-19 MED ORDER — REGADENOSON 0.4 MG/5ML IV SOLN
0.4000 mg | Freq: Once | INTRAVENOUS | Status: AC
  Administered 2024-10-19: 0.4 mg via INTRAVENOUS

## 2024-10-19 MED ORDER — REGADENOSON 0.4 MG/5ML IV SOLN
INTRAVENOUS | Status: AC
  Filled 2024-10-19: qty 5

## 2024-10-19 MED ORDER — TECHNETIUM TC 99M TETROFOSMIN IV KIT
9.8000 | PACK | Freq: Once | INTRAVENOUS | Status: AC | PRN
  Administered 2024-10-19: 9.8 via INTRAVENOUS

## 2024-10-19 NOTE — Progress Notes (Signed)
 Normal stress test, overall low risk.

## 2024-10-25 ENCOUNTER — Other Ambulatory Visit (HOSPITAL_COMMUNITY): Payer: Self-pay

## 2024-10-25 ENCOUNTER — Ambulatory Visit: Attending: Cardiology | Admitting: Cardiology

## 2024-10-25 ENCOUNTER — Encounter: Payer: Self-pay | Admitting: Cardiology

## 2024-10-25 VITALS — BP 136/60 | HR 89 | Ht 66.0 in | Wt 203.0 lb

## 2024-10-25 DIAGNOSIS — R0989 Other specified symptoms and signs involving the circulatory and respiratory systems: Secondary | ICD-10-CM

## 2024-10-25 DIAGNOSIS — I10 Essential (primary) hypertension: Secondary | ICD-10-CM

## 2024-10-25 DIAGNOSIS — R0609 Other forms of dyspnea: Secondary | ICD-10-CM | POA: Diagnosis not present

## 2024-10-25 DIAGNOSIS — I351 Nonrheumatic aortic (valve) insufficiency: Secondary | ICD-10-CM | POA: Diagnosis not present

## 2024-10-25 DIAGNOSIS — J4520 Mild intermittent asthma, uncomplicated: Secondary | ICD-10-CM

## 2024-10-25 MED ORDER — ALBUTEROL SULFATE HFA 108 (90 BASE) MCG/ACT IN AERS
2.0000 | INHALATION_SPRAY | Freq: Four times a day (QID) | RESPIRATORY_TRACT | 0 refills | Status: AC | PRN
  Filled 2024-10-25: qty 6.7, 25d supply, fill #0

## 2024-10-25 NOTE — Progress Notes (Signed)
 " Cardiology Office Note:  .   Date:  10/25/2024  ID:  Sarah Cooper, DOB Jan 29, 1951, MRN [id number] PCP: Vernadine Charlie ORN, MD  [facility] Providers Cardiologist:  Gordy Bergamo, MD   History of Present Illness: .   Sarah Cooper is a 74 y.o. African American female with hypertension, mild hyperlipidemia, prediabetes mellitus, insomnia, bilateral mild leg edema secondary to varicose veins, degenerative arthritis, morbid obesity, chronic dyspnea on exertion had called our office 3 weeks ago with worsening dyspnea on exertion and also precordial pain, underwent nuclear stress test on 10/19/2024 revealing no evidence of ischemia with ejection fraction of 61%.  She now presents for follow-up.  No further chest pain, but does endorse dyspnea on exertion and occasional wheezing.     Discussed the use of AI scribe software for clinical note transcription with the patient, who gave verbal consent to proceed.  History of Present Illness Sarah Cooper is a 74 year old female with chronic diastolic heart failure who presents with shortness of breath and a recent episode of chest pain.  She has exertional shortness of breath with walking short distances, such as from the parking lot to her house or into a store, occurring most of the time and described as very labored. She denies waking at night short of breath or nocturnal dyspnea. She has not used the treadmill since late last year.  She had one brief episode of chest pain that was mildly painful and lasted about ten seconds, with no recurrence. A stress test was performed on October 19, 2024.  She notes occasional mild wheezing with short-distance walking.  She has chronic diastolic heart failure and aortic valve leakage identified on echocardiogram in 2021. She has minimal carotid plaque documented on studies in February 2023 and November 2024.  Cholesterol in August 2024 showed total cholesterol 189, triglycerides 73, HDL 81,  and LDL 93. Kidney function in 2024 showed creatinine clearance 46.91 and eGFR 40 mL/min. A1c was 5.8% at her last visit in late 2025.  Cardiac Studies relevent.    Cardiac Studies & Procedures   ______________________________________________________________________________________________  MYOCARDIAL PERFUSION IMAGING 10/19/2024   Findings are consistent with no ischemia. The study is low risk.   No ST deviation was noted. The ECG was not diagnostic due to pharmacologic protocol.   LV perfusion is abnormal. Defect 1: There is a small defect with mild reduction in uptake present in the mid to basal anteroseptal location(s) that is fixed. There is normal wall motion in the defect area. Consistent with artifact.   Left ventricular function is normal. Nuclear stress EF: 61%. The left ventricular ejection fraction is normal (55-65%). End diastolic cavity size is mildly enlarged. End systolic cavity size is normal. Small sized, fixed basal to mid anteroseptal perfusion defect. May be attenuation artifact. No reversible ischemia, LVEF 61% with normal wall motion. This is a low risk study. Compared to a prior study in 2021, the LVEF is higher.   ECHOCARDIOGRAM COMPLETE 04/06/2020 Study Quality: Technically Difficult Normal LV systolic function with visual EF 55-60%. Left ventricle cavity is normal in size. Mild left ventricular hypertrophy. Normal global wall motion. Normal diastolic filling pattern, normal LAP. Calculated EF 57%. Moderate (Grade II) aortic regurgitation. Mild tricuspid regurgitation. No evidence of pulmonary hypertension. No significant change compared to previous study dated 08/19/2019.  Carotid artery duplex 08/31/2023: Bilateral ICA 1 to 39% stenosis with heterogenous plaque. Bilateral antegrade vertebral artery flow. Normal subclavian hemodynamics.   Overall very minimal disease  is evident, no further surveillance is indicated.  Continue present medications.   ______________________________________________________________________________________________     EKG:      Labs   Care everywhere/Faxed External Labs:  12/11/23: A1c 5.8%  05/28/23: TC 189, TG 73, HDL 81, LDL 93  ROS  Review of Systems  Cardiovascular:  Positive for dyspnea on exertion. Negative for chest pain and leg swelling.  Respiratory:  Positive for wheezing.    Physical Exam:   VS:  BP 136/60   Pulse 89   Ht 5' 6 (1.676 m)   Wt 203 lb (92.1 kg)   LMP 10/15/2007   SpO2 98%   BMI 32.77 kg/m    Wt Readings from Last 3 Encounters:  10/25/24 203 lb (92.1 kg)  07/28/24 196 lb 1.6 oz (89 kg)  12/18/21 214 lb (97.1 kg)    BP Readings from Last 3 Encounters:  10/25/24 136/60  07/28/24 (!) 103/58  12/18/21 129/73   Physical Exam Neck:     Vascular: No JVD.  Cardiovascular:     Rate and Rhythm: Normal rate and regular rhythm.     Pulses: Intact distal pulses.          Carotid pulses are  on the right side with bruit and  on the left side with bruit.    Heart sounds: S1 normal and S2 normal. Murmur heard.     Early systolic murmur is present with a grade of 2/6 at the upper right sternal border.     No gallop.  Pulmonary:     Effort: Pulmonary effort is normal.     Breath sounds: Normal breath sounds.  Abdominal:     General: Bowel sounds are normal.     Palpations: Abdomen is soft.  Musculoskeletal:     Right lower leg: No edema.     Left lower leg: No edema.     ASSESSMENT AND PLAN: .      ICD-10-CM   1. DOE (dyspnea on exertion)  R06.09 albuterol  (VENTOLIN  HFA) 108 (90 Base) MCG/ACT inhaler    ECHOCARDIOGRAM COMPLETE    2. Mild intermittent reactive airway disease without complication  J45.20 albuterol  (VENTOLIN  HFA) 108 (90 Base) MCG/ACT inhaler    3. Moderate aortic regurgitation  I35.1 ECHOCARDIOGRAM COMPLETE    4. Primary hypertension  I10     5. Bilateral carotid bruits  R09.89      Assessment & Plan Mild intermittent  asthma Intermittent shortness of breath and wheezing, particularly with exertion, suggest reactive airway disease. Symptoms are inconsistent, occurring 50-60 % of the time during activities like walking short distances. Normal stress test and clear lung examination today support asthma over heart failure. No evidence of heart blockages or worsening heart failure. - Prescribed albuterol  inhaler for use during episodes of wheezing or shortness of breath. - Advised monitoring for patterns in symptoms to identify triggers.  Moderate aortic regurgitation Previous echocardiogram in 2021 showed moderate aortic regurgitation. No significant changes in symptoms or examination findings suggestive of worsening regurgitation. No current evidence of heart failure or significant valve dysfunction. - Ordered echocardiogram to assess current status of aortic regurgitation to exclude etiology of dyspnea. - No change in physical exam with regard to murmur - No clinical evidence of heart failure  Bilateral carotid bruits Presence of bilateral carotid bruits with previous imaging showing minimal plaque and no significant blockages. No current evidence of carotid artery disease progression.   Follow up: I will personally perform the test and if I find abnormalities,  will perform further evaluation. Otherwise unless new on ongoing symptoms(patient advised to contact us ), preventive  therapy is recommended. I will then see the patient on a PRN basis.   Signed,  Gordy Bergamo, MD, [facility] 10/25/2024, 6:09 PM [facility] [address] [place], [place] [address] Phone: [phone]. Fax:  [phone]  "

## 2024-10-25 NOTE — Patient Instructions (Addendum)
 Medication Instructions:  albuterol  (VENTOLIN  HFA) 108 (90 Base) MCG/ACT inhaler         Inhale 2 puffs into the lungs every 6 (six) hours as needed for wheezing or shortness of breath., Starting Mon 10/25/2024, Normal   *If you need a refill on your cardiac medications before your next appointment, please call your pharmacy*  Lab Work: None ordered If you have labs (blood work) drawn today and your tests are completely normal, you will receive your results only by: MyChart Message (if you have MyChart) OR A paper copy in the mail If you have any lab test that is abnormal or we need to change your treatment, we will call you to review the results.  Testing/Procedures: Your physician has requested that you have an echocardiogram. Echocardiography is a painless test that uses sound waves to create images of your heart. It provides your doctor with information about the size and shape of your heart and how well your hearts chambers and valves are working. This procedure takes approximately one hour. There are no restrictions for this procedure. Please do NOT wear cologne, perfume, aftershave, or lotions (deodorant is allowed). Please arrive 15 minutes prior to your appointment time.  Please note: We ask at that you not bring children with you during ultrasound (echo/ vascular) testing. Due to room size and safety concerns, children are not allowed in the ultrasound rooms during exams. Our front office staff cannot provide observation of children in our lobby area while testing is being conducted. An adult accompanying a patient to their appointment will only be allowed in the ultrasound room at the discretion of the ultrasound technician under special circumstances. We apologize for any inconvenience.   Follow-Up: At [facility], you and your health needs are our priority.  As part of our continuing mission to provide you with exceptional heart care, our providers are all part of one  team.  This team includes your primary Cardiologist (physician) and Advanced Practice Providers or APPs (Physician Assistants and Nurse Practitioners) who all work together to provide you with the care you need, when you need it.  Your next appointment:    Follow up as needed  Provider:   Gordy Bergamo, MD    We recommend signing up for the patient portal called MyChart.  Sign up information is provided on this After Visit Summary.  MyChart is used to connect with patients for Virtual Visits (Telemedicine).  Patients are able to view lab/test results, encounter notes, upcoming appointments, etc.  Non-urgent messages can be sent to your provider as well.   To learn more about what you can do with MyChart, go to [email or web address].

## 2024-11-30 ENCOUNTER — Ambulatory Visit (HOSPITAL_COMMUNITY)
